# Patient Record
Sex: Female | Born: 1985
Health system: Southern US, Community
[De-identification: ages and names within clinical notes are randomized; demographics above are authoritative.]

## PROBLEM LIST (undated history)

## (undated) DIAGNOSIS — J45909 Unspecified asthma, uncomplicated: Secondary | ICD-10-CM

## (undated) DIAGNOSIS — T7840XA Allergy, unspecified, initial encounter: Secondary | ICD-10-CM

## (undated) DIAGNOSIS — O24419 Gestational diabetes mellitus in pregnancy, unspecified control: Secondary | ICD-10-CM

## (undated) DIAGNOSIS — K5900 Constipation, unspecified: Secondary | ICD-10-CM

## (undated) DIAGNOSIS — E785 Hyperlipidemia, unspecified: Secondary | ICD-10-CM

## (undated) DIAGNOSIS — N939 Abnormal uterine and vaginal bleeding, unspecified: Secondary | ICD-10-CM

## (undated) DIAGNOSIS — N920 Excessive and frequent menstruation with regular cycle: Secondary | ICD-10-CM

## (undated) DIAGNOSIS — N852 Hypertrophy of uterus: Secondary | ICD-10-CM

## (undated) DIAGNOSIS — B159 Hepatitis A without hepatic coma: Secondary | ICD-10-CM

## (undated) DIAGNOSIS — R87629 Unspecified abnormal cytological findings in specimens from vagina: Secondary | ICD-10-CM

## (undated) HISTORY — DX: Abnormal uterine and vaginal bleeding, unspecified: N93.9

## (undated) HISTORY — DX: Unspecified asthma, uncomplicated: J45.909

## (undated) HISTORY — DX: Hypertrophy of uterus: N85.2

## (undated) HISTORY — DX: Excessive and frequent menstruation with regular cycle: N92.0

## (undated) HISTORY — DX: Hyperlipidemia, unspecified: E78.5

## (undated) HISTORY — DX: Unspecified abnormal cytological findings in specimens from vagina: R87.629

## (undated) HISTORY — DX: Gestational diabetes mellitus in pregnancy, unspecified control: O24.419

## (undated) HISTORY — DX: Constipation, unspecified: K59.00

## (undated) HISTORY — PX: COLPOSCOPY: SHX161

## (undated) HISTORY — DX: Allergy, unspecified, initial encounter: T78.40XA

## (undated) HISTORY — DX: Hepatitis a without hepatic coma: B15.9

---

## 2011-06-07 ENCOUNTER — Emergency Department: Payer: Self-pay

## 2014-06-09 LAB — HM PAP SMEAR: HM Pap smear: NEGATIVE

## 2015-04-29 ENCOUNTER — Other Ambulatory Visit: Payer: Self-pay

## 2015-04-29 ENCOUNTER — Encounter: Payer: Self-pay | Admitting: Obstetrics and Gynecology

## 2015-05-03 ENCOUNTER — Ambulatory Visit: Admit: 2015-05-03 | Payer: Self-pay | Admitting: Obstetrics and Gynecology

## 2015-05-03 SURGERY — LAPAROSCOPY OPERATIVE
Anesthesia: Choice

## 2015-06-15 ENCOUNTER — Ambulatory Visit (INDEPENDENT_AMBULATORY_CARE_PROVIDER_SITE_OTHER): Payer: BLUE CROSS/BLUE SHIELD | Admitting: Obstetrics and Gynecology

## 2015-06-15 ENCOUNTER — Encounter: Payer: Self-pay | Admitting: Obstetrics and Gynecology

## 2015-06-15 VITALS — BP 114/73 | HR 71 | Ht 64.0 in | Wt 150.0 lb

## 2015-06-15 DIAGNOSIS — N946 Dysmenorrhea, unspecified: Secondary | ICD-10-CM | POA: Diagnosis not present

## 2015-06-15 DIAGNOSIS — Z8742 Personal history of other diseases of the female genital tract: Secondary | ICD-10-CM | POA: Diagnosis not present

## 2015-06-15 DIAGNOSIS — N852 Hypertrophy of uterus: Secondary | ICD-10-CM | POA: Diagnosis not present

## 2015-06-15 DIAGNOSIS — Z01419 Encounter for gynecological examination (general) (routine) without abnormal findings: Secondary | ICD-10-CM

## 2015-06-15 DIAGNOSIS — Z975 Presence of (intrauterine) contraceptive device: Secondary | ICD-10-CM

## 2015-06-15 DIAGNOSIS — K649 Unspecified hemorrhoids: Secondary | ICD-10-CM | POA: Diagnosis not present

## 2015-06-15 DIAGNOSIS — N920 Excessive and frequent menstruation with regular cycle: Secondary | ICD-10-CM | POA: Insufficient documentation

## 2015-06-15 NOTE — Progress Notes (Signed)
Patient ID: Lenise Herald, female   DOB: 08-11-1986, 29 y.o.   MRN: [id number] ANNUAL PREVENTATIVE CARE GYN  ENCOUNTER NOTE  Subjective:       Lakeria Austynn Pridmore is a 29 y.o. [id number] female here for a routine annual gynecologic exam.  Current complaints: 1.  Birth control options 2.  Fatigue. 3.  Headaches.  Patient desires change in birth control.  She would like to proceed with Mirena IUD insertion.  She has had difficulty being compliant with oral contraceptives.  Previously the patient had a ParaGard IUD which was removed because of her chronic pain.     Gynecologic History Patient's last menstrual period was 06/05/2015. Contraception: OCP (estrogen/progesterone) Last Pap: pap w rflx- neg 05/2014. Results were: normal Last mammogram: n/a. Results were: n/a Obstetric History OB History  Gravida Para Term Preterm AB SAB TAB Ectopic Multiple Living  1 1 1       1     # Outcome Date GA Lbr Len/2nd Weight Sex Delivery Anes PTL Lv  1 Term    6 lb 14.4 oz (3.13 kg) M VBAC   Y      Past Medical History  Diagnosis Date  . Abnormal uterine bleeding (AUB)   . Enlarged uterus   . Vaginal Pap smear, abnormal   . Constipation   . Heavy periods     Past Surgical History  Procedure Laterality Date  . Colposcopy      Current Outpatient Prescriptions on File Prior to Visit  Medication Sig Dispense Refill  . ibuprofen (ADVIL,MOTRIN) 200 MG tablet Take 200 mg by mouth every 6 (six) hours as needed.    . norethindrone-ethinyl estradiol (JUNEL FE,GILDESS FE,LOESTRIN FE) 1-20 MG-MCG tablet Take 1 tablet by mouth daily.     No current facility-administered medications on file prior to visit.    No Known Allergies  Social History   Social History  . Marital Status: Single    Spouse Name: N/A  . Number of Children: N/A  . Years of Education: N/A   Occupational History  . Not on file.   Social History Main Topics  . Smoking status: Never Smoker   . Smokeless tobacco: Not  on file  . Alcohol Use: Yes  . Drug Use: No  . Sexual Activity: Yes    Birth Control/ Protection: Pill   Other Topics Concern  . Not on file   Social History Narrative    Family History  Problem Relation Age of Onset  . Cancer Neg Hx   . Diabetes Neg Hx   . Heart disease Neg Hx     The following portions of the patient's history were reviewed and updated as appropriate: allergies, current medications, past family history, past medical history, past social history, past surgical history and problem list.  Review of Systems ROS Review of Systems - General ROS: negative for - chills, fatigue, fever, hot flashes, night sweats, weight gain or weight loss Psychological ROS: negative for - anxiety, decreased libido, depression, mood swings, physical abuse or sexual abuse Ophthalmic ROS: negative for - blurry vision, eye pain or loss of vision ENT ROS: negative for - headaches, hearing change, visual changes or vocal changes Allergy and Immunology ROS: negative for - hives, itchy/watery eyes or seasonal allergies Hematological and Lymphatic ROS: negative for - bleeding problems, bruising, swollen lymph nodes or weight loss Endocrine ROS: negative for - galactorrhea, hair pattern changes, hot flashes, malaise/lethargy, mood swings, palpitations, polydipsia/polyuria, skin changes, temperature intolerance or unexpected weight  changes Breast ROS: negative for - new or changing breast lumps or nipple discharge Respiratory ROS: negative for - cough or shortness of breath Cardiovascular ROS: negative for - chest pain, irregular heartbeat, palpitations or shortness of breath Gastrointestinal ROS: no abdominal pain, change in bowel habits, or black or bloody stools Genito-Urinary ROS: no dysuria, trouble voiding, or hematuria Musculoskeletal ROS: negative for - joint pain or joint stiffness Neurological ROS: negative for - bowel and bladder control changes Dermatological ROS: negative for rash  and skin lesion changes   Objective:   BP 114/73 mmHg  Pulse 71  Ht  (1.626 m)  Wt 150 lb (68.04 kg)  BMI 25.73 kg/m2  LMP 06/05/2015 CONSTITUTIONAL: Well-developed, well-nourished female in no acute distress.  PSYCHIATRIC: Normal mood and affect. Normal behavior. Normal judgment and thought content. NEUROLGIC: Alert and oriented to person, place, and time. Normal muscle tone coordination. No cranial nerve deficit noted. HENT:  Normocephalic, atraumatic, External right and left ear normal. Oropharynx is clear and moist EYES: Conjunctivae and EOM are normal. Pupils are equal, round, and reactive to light. No scleral icterus.  NECK: Normal range of motion, supple, no masses.  Normal thyroid.  SKIN: Skin is warm and dry. No rash noted. Not diaphoretic. No erythema. No pallor. CARDIOVASCULAR: Normal heart rate noted, regular rhythm, no murmur. RESPIRATORY: Clear to auscultation bilaterally. Effort and breath sounds normal, no problems with respiration noted. BREASTS: Symmetric in size. No masses, skin changes, nipple drainage, or lymphadenopathy. ABDOMEN: Soft, normal bowel sounds, no distention noted.  No tenderness, rebound or guarding.  BLADDER: Normal PELVIC:  External Genitalia: Normal  BUS: Normal  Vagina: Normal  Cervix: Normal; mild cervical motion tenderness  Uterus: slightly enlarged 10 weeks size, mobile, 1/4 tender  Adnexa: Normal  RV: External Exam NormaI  MUSCULOSKELETAL: Normal range of motion. No tenderness.  No cyanosis, clubbing, or edema.  2+ distal pulses. LYMPHATIC: No Axillary, Supraclavicular, or Inguinal Adenopathy.    Assessment:   Annual gynecologic examination 29 y.o. Contraception: OCP (estrogen/progesterone); dissatisfied with pill causes of difficulty with compliance; desires Mirena IUD Normal BMI Headaches. Fatigue  Plan:  Pap: Pap, Reflex if ASCUS Mammogram: Not Indicated Stool Guaiac Testing:  Not Indicated Labs: lipid vitd tsh a1c  fbs Routine preventative health maintenance measures emphasized: Exercise/Diet/Weight control, Tobacco Warnings, Alcohol/Substance use risks and Safe Sex Abstain from intercourse 2 weeks; return in 2 weeks for Mirena IUD insertion Return to Clinic - 1 Year for annual exam.   Darol Destine, CMA  Herold Harms, MD

## 2015-06-15 NOTE — Patient Instructions (Signed)
1.  Pap smear performed. 2.  Abstain from intercourse for 2 weeks. 3.  Return in 2 weeks for Mirena IUD insertion. 4.  Annual exam in 1 year.

## 2015-06-18 LAB — PAP IG W/ RFLX HPV ASCU: PAP SMEAR COMMENT: 0

## 2015-06-30 ENCOUNTER — Telehealth: Payer: Self-pay | Admitting: Obstetrics and Gynecology

## 2015-06-30 MED ORDER — NORETHIN ACE-ETH ESTRAD-FE 1-20 MG-MCG PO TABS: 1.0000 | ORAL_TABLET | Freq: Every day | ORAL | Status: DC

## 2015-06-30 NOTE — Telephone Encounter (Signed)
PT HAD TO RESCHEDULE HER IUD INSERTION, CAN YOU PLEASE REFIILL HER BC 1 MORE MONTH UNTIL HER VISIT  OCT 6

## 2015-06-30 NOTE — Telephone Encounter (Signed)
Pt aware ocp erx. 

## 2015-07-01 ENCOUNTER — Ambulatory Visit: Payer: BLUE CROSS/BLUE SHIELD | Admitting: Obstetrics and Gynecology

## 2015-07-22 ENCOUNTER — Ambulatory Visit: Payer: BLUE CROSS/BLUE SHIELD | Admitting: Obstetrics and Gynecology

## 2015-08-02 ENCOUNTER — Telehealth: Payer: Self-pay | Admitting: Obstetrics and Gynecology

## 2015-08-02 MED ORDER — NORETHIN ACE-ETH ESTRAD-FE 1-20 MG-MCG PO TABS: 1.0000 | ORAL_TABLET | Freq: Every day | ORAL | Status: DC

## 2015-08-02 NOTE — Telephone Encounter (Signed)
Please refill her BC, pt cancelled the iud insertion to think about it some more

## 2015-08-02 NOTE — Telephone Encounter (Signed)
Pt aware ocp erx. If pt decides not to have iud inserted she will let me know and I will send ocp until next AE.

## 2015-08-03 ENCOUNTER — Ambulatory Visit: Payer: BLUE CROSS/BLUE SHIELD | Admitting: Obstetrics and Gynecology

## 2015-08-13 ENCOUNTER — Encounter: Payer: Self-pay | Admitting: Family Medicine

## 2015-08-13 ENCOUNTER — Encounter (INDEPENDENT_AMBULATORY_CARE_PROVIDER_SITE_OTHER): Payer: Self-pay

## 2015-08-13 ENCOUNTER — Ambulatory Visit (INDEPENDENT_AMBULATORY_CARE_PROVIDER_SITE_OTHER): Payer: BLUE CROSS/BLUE SHIELD | Admitting: Family Medicine

## 2015-08-13 VITALS — BP 116/68 | HR 94 | Temp 98.6°F | Resp 18 | Ht 64.0 in | Wt 146.5 lb

## 2015-08-13 DIAGNOSIS — J45909 Unspecified asthma, uncomplicated: Secondary | ICD-10-CM | POA: Insufficient documentation

## 2015-08-13 DIAGNOSIS — J4520 Mild intermittent asthma, uncomplicated: Secondary | ICD-10-CM

## 2015-08-13 DIAGNOSIS — Z7189 Other specified counseling: Secondary | ICD-10-CM

## 2015-08-13 DIAGNOSIS — Z7689 Persons encountering health services in other specified circumstances: Secondary | ICD-10-CM | POA: Insufficient documentation

## 2015-08-13 NOTE — Progress Notes (Signed)
Name: Christy Farmer   MRN: [id number]    DOB: 01-05-1986   Date:08/13/2015       Progress Note  Subjective  Chief Complaint  Chief Complaint  Patient presents with  . est care    HPI  Pt. Is here to establish care. She is a former Dr. Marguerite OleaMoffett patient and is being [facility].  She denies any complaints today.  Past Medical History  Diagnosis Date  . Abnormal uterine bleeding (AUB)   . Enlarged uterus   . Vaginal Pap smear, abnormal   . Constipation   . Heavy periods   . Asthma   . Allergy     seasonal allergies.    Past Surgical History  Procedure Laterality Date  . Colposcopy      Family History  Problem Relation Age of Onset  . Cancer Neg Hx   . Diabetes Neg Hx   . Heart disease Neg Hx   . Hyperlipidemia Mother   . Hypertension Mother   . Healthy Father   . Healthy Brother     Social History   Social History  . Marital Status: Single    Spouse Name: N/A  . Number of Children: N/A  . Years of Education: N/A   Occupational History  . Not on file.   Social History Main Topics  . Smoking status: Never Smoker   . Smokeless tobacco: Not on file  . Alcohol Use: 1.2 oz/week    2 Glasses of wine per week  . Drug Use: No  . Sexual Activity: Yes    Birth Control/ Protection: Pill   Other Topics Concern  . Not on file   Social History Narrative    Current outpatient prescriptions:  .  albuterol (PROAIR HFA) 108 (90 BASE) MCG/ACT inhaler, Inhale into the lungs., Disp: , Rfl:  .  fexofenadine (ALLEGRA) 180 MG tablet, Take by mouth., Disp: , Rfl:  .  fluticasone (FLONASE) 50 MCG/ACT nasal spray, Place into the nose., Disp: , Rfl:  .  ibuprofen (ADVIL,MOTRIN) 200 MG tablet, Take 200 mg by mouth every 6 (six) hours as needed., Disp: , Rfl:  .  norethindrone-ethinyl estradiol (JUNEL FE,GILDESS FE,LOESTRIN FE) 1-20 MG-MCG tablet, Take 1 tablet by mouth daily., Disp: 1 Package, Rfl: 1  No Known Allergies   ROS   Objective  Filed  Vitals:   08/13/15 0959  BP: 116/68  Pulse: 94  Temp: 98.6 F (37 C)  Resp: 18  Height: 5\' 4"  (1.626 m)  Weight: 146 lb 8 oz (66.452 kg)  SpO2: 97%    Physical Exam  Constitutional: She is oriented to person, place, and time and well-developed, well-nourished, and in no distress.  Cardiovascular: Normal rate, regular rhythm and normal heart sounds.   Pulmonary/Chest: Effort normal and breath sounds normal. She has no wheezes.  Neurological: She is alert and oriented to person, place, and time.  Psychiatric: Memory, affect and judgment normal.  Nursing note and vitals reviewed.   Assessment & Plan  1. Encounter to establish care with new doctor Schedule for an annual fasting physical exam.  2. Asthma, mild intermittent, uncomplicated Only takes albuterol as needed.   Alois Colgan Asad A. Faylene KurtzShah [facility] 08/13/2015 10:20 AM

## 2015-08-18 ENCOUNTER — Ambulatory Visit (INDEPENDENT_AMBULATORY_CARE_PROVIDER_SITE_OTHER): Payer: BLUE CROSS/BLUE SHIELD | Admitting: Family Medicine

## 2015-08-18 ENCOUNTER — Encounter: Payer: Self-pay | Admitting: Family Medicine

## 2015-08-18 VITALS — BP 102/60 | HR 105 | Temp 99.7°F | Resp 16 | Ht 65.0 in | Wt 152.2 lb

## 2015-08-18 DIAGNOSIS — J029 Acute pharyngitis, unspecified: Secondary | ICD-10-CM | POA: Insufficient documentation

## 2015-08-18 DIAGNOSIS — R6889 Other general symptoms and signs: Secondary | ICD-10-CM | POA: Diagnosis not present

## 2015-08-18 LAB — POCT INFLUENZA A/B
INFLUENZA A, POC: NEGATIVE
Influenza B, POC: NEGATIVE

## 2015-08-18 LAB — POCT RAPID STREP A (OFFICE): RAPID STREP A SCREEN: NEGATIVE

## 2015-08-18 NOTE — Progress Notes (Signed)
Name: Christy Farmer   MRN: [id number]    DOB: 01-18-1986   Date:08/18/2015       Progress Note  Subjective  Chief Complaint  Chief Complaint  Patient presents with  . Sore Throat    chills, body aches for 1 day    Sore Throat  This is a new problem. The current episode started yesterday. There has been no fever (elevated Temp of 99.10F). Associated symptoms include headaches. Pertinent negatives include no coughing, diarrhea, ear pain, shortness of breath or trouble swallowing. She has had no exposure to strep. She has tried NSAIDs for the symptoms.   Past Medical History  Diagnosis Date  . Abnormal uterine bleeding (AUB)   . Enlarged uterus   . Vaginal Pap smear, abnormal   . Constipation   . Heavy periods   . Asthma   . Allergy     seasonal allergies.    Past Surgical History  Procedure Laterality Date  . Colposcopy      Family History  Problem Relation Age of Onset  . Cancer Neg Hx   . Diabetes Neg Hx   . Heart disease Neg Hx   . Hyperlipidemia Mother   . Hypertension Mother   . Healthy Father   . Healthy Brother     Social History   Social History  . Marital Status: Single    Spouse Name: N/A  . Number of Children: N/A  . Years of Education: N/A   Occupational History  . Not on file.   Social History Main Topics  . Smoking status: Never Smoker   . Smokeless tobacco: Not on file  . Alcohol Use: 1.2 oz/week    2 Glasses of wine per week  . Drug Use: No  . Sexual Activity: Yes    Birth Control/ Protection: Pill   Other Topics Concern  . Not on file   Social History Narrative     Current outpatient prescriptions:  .  albuterol (PROAIR HFA) 108 (90 BASE) MCG/ACT inhaler, Inhale into the lungs., Disp: , Rfl:  .  fexofenadine (ALLEGRA) 180 MG tablet, Take by mouth., Disp: , Rfl:  .  fluticasone (FLONASE) 50 MCG/ACT nasal spray, Place into the nose., Disp: , Rfl:  .  ibuprofen (ADVIL,MOTRIN) 200 MG tablet, Take 200 mg by mouth every 6 (six)  hours as needed., Disp: , Rfl:  .  norethindrone-ethinyl estradiol (JUNEL FE,GILDESS FE,LOESTRIN FE) 1-20 MG-MCG tablet, Take 1 tablet by mouth daily., Disp: 1 Package, Rfl: 1  No Known Allergies   Review of Systems  Constitutional: Positive for fever and chills.  HENT: Positive for sore throat. Negative for ear pain and trouble swallowing.   Respiratory: Negative for cough and shortness of breath.   Cardiovascular: Negative for chest pain.  Gastrointestinal: Negative for diarrhea.  Musculoskeletal: Positive for myalgias.  Neurological: Positive for headaches.    Objective  Filed Vitals:   08/18/15 0906  BP: 102/60  Pulse: 105  Temp: 99.7 F (37.6 C)  TempSrc: Oral  Resp: 16  Height:  (1.651 m)  Weight: 152 lb 3.2 oz (69.037 kg)  SpO2: 97%    Physical Exam  Constitutional: She is well-developed, well-nourished, and in no distress.  HENT:  Right Ear: Tympanic membrane and ear canal normal.  Left Ear: Tympanic membrane and ear canal normal.  Mouth/Throat: Posterior oropharyngeal edema and posterior oropharyngeal erythema present. No oropharyngeal exudate.  Cardiovascular: Normal rate, regular rhythm and normal heart sounds.   Pulmonary/Chest: Effort normal and  breath sounds normal. She has no decreased breath sounds. She has no wheezes. She has no rales.  Nursing note and vitals reviewed.    Assessment & Plan  1. Sore throat  - POCT rapid strep A - Anaerobic and Aerobic Culture  2. Viral pharyngitis Both strep and flu test are negative. Likely viral pharyngitis. Advised on conservative measures including hydration, salt water gargles, and NSAID therapy. RTC if no clinical improvement within 72 hours. - Anaerobic and Aerobic Culture  3. Flu-like symptoms  - POCT Influenza A/B    Orvill Coulthard Asad A. Faylene KurtzShah [facility] 08/18/2015 9:33 AM

## 2015-08-20 LAB — PLEASE NOTE

## 2015-08-26 LAB — ANAEROBIC AND AEROBIC CULTURE

## 2015-09-17 ENCOUNTER — Encounter: Payer: BLUE CROSS/BLUE SHIELD | Admitting: Family Medicine

## 2015-09-22 ENCOUNTER — Ambulatory Visit: Payer: BLUE CROSS/BLUE SHIELD | Admitting: Family Medicine

## 2015-11-01 ENCOUNTER — Encounter (INDEPENDENT_AMBULATORY_CARE_PROVIDER_SITE_OTHER): Payer: Self-pay

## 2015-11-18 ENCOUNTER — Encounter: Payer: BLUE CROSS/BLUE SHIELD | Admitting: Family Medicine

## 2015-12-01 ENCOUNTER — Ambulatory Visit (INDEPENDENT_AMBULATORY_CARE_PROVIDER_SITE_OTHER): Payer: BLUE CROSS/BLUE SHIELD | Admitting: Family Medicine

## 2015-12-01 ENCOUNTER — Encounter: Payer: Self-pay | Admitting: Family Medicine

## 2015-12-01 VITALS — BP 100/69 | HR 89 | Temp 98.6°F | Resp 19 | Ht 65.0 in | Wt 152.9 lb

## 2015-12-01 DIAGNOSIS — B9789 Other viral agents as the cause of diseases classified elsewhere: Secondary | ICD-10-CM

## 2015-12-01 DIAGNOSIS — J029 Acute pharyngitis, unspecified: Secondary | ICD-10-CM | POA: Diagnosis not present

## 2015-12-01 DIAGNOSIS — Z0000 Encounter for general adult medical examination without abnormal findings: Secondary | ICD-10-CM | POA: Diagnosis not present

## 2015-12-01 DIAGNOSIS — J028 Acute pharyngitis due to other specified organisms: Secondary | ICD-10-CM

## 2015-12-01 NOTE — Progress Notes (Signed)
Name: Christy Farmer   MRN: [id number]    DOB: Apr 27, 1986   Date:12/01/2015       Progress Note  Subjective  Chief Complaint  Chief Complaint  Patient presents with  . Annual Exam    CPE    HPI  Pt. Is here for a Complete Physical Exam. She is feeling sick (sore throat, nasal sinus congestion, ear pain).  She sees Dr. Greggory Keen for Gynecological follow up.  Past Medical History  Diagnosis Date  . Abnormal uterine bleeding (AUB)   . Enlarged uterus   . Vaginal Pap smear, abnormal   . Constipation   . Heavy periods   . Asthma   . Allergy     seasonal allergies.    Past Surgical History  Procedure Laterality Date  . Colposcopy      Family History  Problem Relation Age of Onset  . Cancer Neg Hx   . Diabetes Neg Hx   . Heart disease Neg Hx   . Hyperlipidemia Mother   . Hypertension Mother   . Healthy Father   . Healthy Brother     Social History   Social History  . Marital Status: Single    Spouse Name: N/A  . Number of Children: N/A  . Years of Education: N/A   Occupational History  . Not on file.   Social History Main Topics  . Smoking status: Never Smoker   . Smokeless tobacco: Not on file  . Alcohol Use: 1.2 oz/week    2 Glasses of wine per week  . Drug Use: No  . Sexual Activity: Yes    Birth Control/ Protection: Pill   Other Topics Concern  . Not on file   Social History Narrative     Current outpatient prescriptions:  .  albuterol (PROAIR HFA) 108 (90 BASE) MCG/ACT inhaler, Inhale into the lungs., Disp: , Rfl:  .  fluticasone (FLONASE) 50 MCG/ACT nasal spray, Place into the nose., Disp: , Rfl:  .  ibuprofen (ADVIL,MOTRIN) 200 MG tablet, Take 200 mg by mouth every 6 (six) hours as needed., Disp: , Rfl:  .  norethindrone-ethinyl estradiol (JUNEL FE,GILDESS FE,LOESTRIN FE) 1-20 MG-MCG tablet, Take 1 tablet by mouth daily., Disp: 1 Package, Rfl: 1  No Known Allergies   Review of Systems  Constitutional: Negative for fever, chills  and weight loss.  HENT: Positive for congestion, ear pain and sore throat.   Eyes: Negative for blurred vision and double vision.  Respiratory: Positive for cough and sputum production.   Cardiovascular: Negative for chest pain and palpitations.  Gastrointestinal: Negative for heartburn, nausea, vomiting, abdominal pain (occasional lower abdomen pains, present for last 2 months.), diarrhea, constipation and blood in stool.  Genitourinary: Negative for dysuria, urgency and hematuria.  Musculoskeletal: Negative for back pain and joint pain.  Neurological: Negative for dizziness.  Psychiatric/Behavioral: Negative for depression. The patient is not nervous/anxious and does not have insomnia.     Objective  Filed Vitals:   12/01/15 1115  BP: 100/69  Pulse: 89  Temp: 98.6 F (37 C)  TempSrc: Oral  Resp: 19  Height:  (1.651 m)  Weight: 152 lb 14.4 oz (69.355 kg)  SpO2: 98%    Physical Exam  Constitutional: She is oriented to person, place, and time and well-developed, well-nourished, and in no distress.  HENT:  Head: Normocephalic.  Right Ear: Tympanic membrane and ear canal normal.  Left Ear: Tympanic membrane and ear canal normal.  Nose: Right sinus exhibits no maxillary  sinus tenderness. Left sinus exhibits no maxillary sinus tenderness.  Mouth/Throat: Posterior oropharyngeal erythema present. No oropharyngeal exudate.  Eyes: Pupils are equal, round, and reactive to light.  Cardiovascular: Normal rate and regular rhythm.   Pulmonary/Chest: Effort normal and breath sounds normal.  Abdominal: Normal appearance and bowel sounds are normal. There is no tenderness.  Genitourinary:  Deferred to Gyn.  Neurological: She is alert and oriented to person, place, and time.  Skin: Skin is warm and dry.  Psychiatric: Mood, memory, affect and judgment normal.  Nursing note and vitals reviewed.    Assessment & Plan  1. Annual physical exam Obtain age-appropriate laboratory  screenings. Patient follows up with GYN for pelvic exams. - CBC with Differential - Lipid Profile - Comprehensive Metabolic Panel (CMET) - Vitamin D (25 hydroxy) - TSH  2. Acute viral pharyngitis Rapid strep test is negative. Patient advised on conservative measures for now. If symptoms do not improve within next 48 hours, she will call for antibiotic therapy.  3. Sore throat  - POCT rapid strep A   Hermena Swint Asad A. Faylene Kurtz [facility] 12/01/2015 11:48 AM

## 2015-12-02 LAB — COMPREHENSIVE METABOLIC PANEL
ALBUMIN: 4.7 g/dL (ref 3.5–5.5)
ALK PHOS: 42 IU/L (ref 39–117)
ALT: 12 IU/L (ref 0–32)
AST: 18 IU/L (ref 0–40)
Albumin/Globulin Ratio: 1.7 (ref 1.1–2.5)
BUN / CREAT RATIO: 11 (ref 8–20)
BUN: 7 mg/dL (ref 6–20)
Bilirubin Total: 0.3 mg/dL (ref 0.0–1.2)
CHLORIDE: 100 mmol/L (ref 96–106)
CO2: 23 mmol/L (ref 18–29)
Calcium: 9.7 mg/dL (ref 8.7–10.2)
Creatinine, Ser: 0.64 mg/dL (ref 0.57–1.00)
GFR calc Af Amer: 139 mL/min/{1.73_m2} (ref >59)
GFR calc non Af Amer: 121 mL/min/{1.73_m2} (ref >59)
GLUCOSE: 85 mg/dL (ref 65–99)
Globulin, Total: 2.7 g/dL (ref 1.5–4.5)
POTASSIUM: 4.4 mmol/L (ref 3.5–5.2)
SODIUM: 140 mmol/L (ref 134–144)
Total Protein: 7.4 g/dL (ref 6.0–8.5)

## 2015-12-02 LAB — LIPID PANEL
CHOL/HDL RATIO: 3.3 ratio (ref 0.0–4.4)
Cholesterol, Total: 187 mg/dL (ref 100–199)
HDL: 57 mg/dL (ref >39)
LDL Calculated: 107 mg/dL — ABNORMAL HIGH (ref 0–99)
Triglycerides: 114 mg/dL (ref 0–149)
VLDL CHOLESTEROL CAL: 23 mg/dL (ref 5–40)

## 2015-12-02 LAB — TSH: TSH: 1.55 u[IU]/mL (ref 0.450–4.500)

## 2015-12-02 LAB — CBC WITH DIFFERENTIAL/PLATELET
BASOS: 1 %
Basophils Absolute: 0.1 10*3/uL (ref 0.0–0.2)
EOS (ABSOLUTE): 0.2 10*3/uL (ref 0.0–0.4)
Eos: 2 %
Hematocrit: 41 % (ref 34.0–46.6)
Hemoglobin: 13.6 g/dL (ref 11.1–15.9)
Immature Grans (Abs): 0 10*3/uL (ref 0.0–0.1)
Immature Granulocytes: 0 %
Lymphocytes Absolute: 2.7 10*3/uL (ref 0.7–3.1)
Lymphs: 26 %
MCH: 28.8 pg (ref 26.6–33.0)
MCHC: 33.2 g/dL (ref 31.5–35.7)
MCV: 87 fL (ref 79–97)
MONOS ABS: 0.7 10*3/uL (ref 0.1–0.9)
Monocytes: 7 %
NEUTROS ABS: 6.6 10*3/uL (ref 1.4–7.0)
Neutrophils: 64 %
PLATELETS: 312 10*3/uL (ref 150–379)
RBC: 4.72 x10E6/uL (ref 3.77–5.28)
RDW: 14.3 % (ref 12.3–15.4)
WBC: 10.3 10*3/uL (ref 3.4–10.8)

## 2015-12-02 LAB — VITAMIN D 25 HYDROXY (VIT D DEFICIENCY, FRACTURES): VIT D 25 HYDROXY: 21.4 ng/mL — AB (ref 30.0–100.0)

## 2015-12-08 ENCOUNTER — Telehealth: Payer: Self-pay

## 2015-12-08 MED ORDER — VITAMIN D (ERGOCALCIFEROL) 1.25 MG (50000 UNIT) PO CAPS: 50000.0000 [IU] | ORAL_CAPSULE | ORAL | Status: DC

## 2015-12-08 NOTE — Telephone Encounter (Signed)
Prescription for Vitamin D3 50,000 units has been sent to [facility] [address] per Dr. Sherryll Burger patient has been notified

## 2015-12-13 ENCOUNTER — Telehealth: Payer: Self-pay | Admitting: Obstetrics and Gynecology

## 2015-12-13 MED ORDER — NORETHIN ACE-ETH ESTRAD-FE 1-20 MG-MCG PO TABS: 1.0000 | ORAL_TABLET | Freq: Every day | ORAL | Status: DC

## 2015-12-13 NOTE — Telephone Encounter (Signed)
Pt mis placed her pills this weekend. Just found them in her car. She did not take sat and sun pills. Advised to take mondays pill. Aware she will have some abnormal bleeding since she missed her pills. Pt states she wants to try mirena (again). Pt aware I will send in a refill of ocp x 3. [place] to pre-auth her mirena. Will call pt with copay with in a week. Pt aware she will have no IC x 2 weeks and a  Neg upt on day of insertion.

## 2015-12-13 NOTE — Telephone Encounter (Signed)
Pt is on BC pills and misplaced them. She hasn't taken 1 since Friday. She found the pills today and wants to know what to do.

## 2016-01-12 ENCOUNTER — Telehealth: Payer: Self-pay | Admitting: Obstetrics and Gynecology

## 2016-01-12 NOTE — Telephone Encounter (Signed)
Patient called with some questions regarding her birth control. She started her period before she started taking the brown pills.

## 2016-01-12 NOTE — Telephone Encounter (Signed)
Pt is on 2nd pack of ocp. She started her cycle today. 4 days early. Last month she missed a whole week of pills. Advised to keep taking them. Same time qd. If bleeding persists after 3rd pack she will need to be seen.

## 2016-02-11 DIAGNOSIS — R21 Rash and other nonspecific skin eruption: Secondary | ICD-10-CM | POA: Diagnosis not present

## 2016-02-22 ENCOUNTER — Telehealth: Payer: Self-pay | Admitting: Obstetrics and Gynecology

## 2016-02-22 NOTE — Telephone Encounter (Signed)
Patient called requesting an appointment for paragard insertion. She will be on her cycle around 5/21 but Dr D has nothing that week. Can you look at the schedule and let me know where I can put her. Thanks

## 2016-02-24 NOTE — Telephone Encounter (Signed)
Done.KEC °

## 2016-03-21 ENCOUNTER — Encounter: Payer: Self-pay | Admitting: Obstetrics and Gynecology

## 2016-03-21 ENCOUNTER — Ambulatory Visit (INDEPENDENT_AMBULATORY_CARE_PROVIDER_SITE_OTHER): Payer: BLUE CROSS/BLUE SHIELD | Admitting: Obstetrics and Gynecology

## 2016-03-21 VITALS — BP 106/69 | HR 77 | Ht 64.0 in | Wt 153.7 lb

## 2016-03-21 DIAGNOSIS — N920 Excessive and frequent menstruation with regular cycle: Secondary | ICD-10-CM

## 2016-03-21 DIAGNOSIS — N946 Dysmenorrhea, unspecified: Secondary | ICD-10-CM | POA: Diagnosis not present

## 2016-03-21 DIAGNOSIS — Z30014 Encounter for initial prescription of intrauterine contraceptive device: Secondary | ICD-10-CM

## 2016-03-21 LAB — POCT URINE PREGNANCY: PREG TEST UR: NEGATIVE

## 2016-03-21 NOTE — Progress Notes (Signed)
Chief complaint: 1. Mirena IUD insertion  IUD Insertion Procedure Note  Pre-operative Diagnosis: 1. Contraception 2. Dysmenorrhea 3. Menorrhagia  Post-operative Diagnosis: same  Indications: contraception  Procedure Details  Urine pregnancy test was done-negative.  The risks (including infection, bleeding, pain, and uterine perforation) and benefits of the procedure were explained to the patient and Verbal informed consent was obtained.    Cervix cleansed with Betadine. Uterus sounded to 8 cm. IUD inserted without difficulty. String visible and trimmed-3 cm long. Patient tolerated procedure well.  IUD Information: Mirena, Lot # [id number], Expiration date 1/20.  Condition: Stable  Complications: None  Plan:  The patient was advised to call for any fever or for prolonged or severe pain or bleeding. She was advised to use OTC acetaminophen and OTC ibuprofen as needed for mild to moderate pain.   Attending Physician Documentation: Herold HarmsMartin A Defrancesco, MD

## 2016-04-24 ENCOUNTER — Encounter: Payer: Self-pay | Admitting: Family Medicine

## 2016-04-24 ENCOUNTER — Ambulatory Visit (INDEPENDENT_AMBULATORY_CARE_PROVIDER_SITE_OTHER): Payer: Managed Care, Other (non HMO) | Admitting: Family Medicine

## 2016-04-24 VITALS — BP 120/71 | HR 90 | Temp 98.5°F | Resp 15 | Ht 65.0 in | Wt 161.5 lb

## 2016-04-24 DIAGNOSIS — R21 Rash and other nonspecific skin eruption: Secondary | ICD-10-CM

## 2016-04-24 DIAGNOSIS — L259 Unspecified contact dermatitis, unspecified cause: Secondary | ICD-10-CM

## 2016-04-24 MED ORDER — VALACYCLOVIR HCL 1 G PO TABS: 1000.0000 mg | ORAL_TABLET | Freq: Three times a day (TID) | ORAL | Status: DC

## 2016-04-24 MED ORDER — TRAMADOL HCL 50 MG PO TABS: 50.0000 mg | ORAL_TABLET | Freq: Three times a day (TID) | ORAL | Status: DC | PRN

## 2016-04-24 NOTE — Progress Notes (Signed)
Name: Christy Farmer   MRN: [id number]    DOB: 1986/04/17   Date:04/24/2016       Progress Note  Subjective  Chief Complaint  Chief Complaint  Patient presents with  . Rash    Arm and back    Rash This is a new problem. The current episode started in the past 7 days. The problem has been gradually worsening since onset. The affected locations include the right arm and back. The rash is characterized by blistering, burning and pain. Associated with: traveled to [place] last week, took a stroll  outdoors, never really went in to the woods. Pertinent negatives include no cough or fever. Past treatments include anti-itch cream. The treatment provided no relief.    Past Medical History  Diagnosis Date  . Abnormal uterine bleeding (AUB)   . Enlarged uterus   . Vaginal Pap smear, abnormal   . Constipation   . Heavy periods   . Asthma   . Allergy     seasonal allergies.    Past Surgical History  Procedure Laterality Date  . Colposcopy      Family History  Problem Relation Age of Onset  . Cancer Neg Hx   . Diabetes Neg Hx   . Heart disease Neg Hx   . Hyperlipidemia Mother   . Hypertension Mother   . Healthy Father   . Healthy Brother     Social History   Social History  . Marital Status: Single    Spouse Name: N/A  . Number of Children: N/A  . Years of Education: N/A   Occupational History  . Not on file.   Social History Main Topics  . Smoking status: Never Smoker   . Smokeless tobacco: Not on file  . Alcohol Use: 1.2 oz/week    2 Glasses of wine per week  . Drug Use: No  . Sexual Activity: Yes    Birth Control/ Protection: Pill   Other Topics Concern  . Not on file   Social History Narrative     Current outpatient prescriptions:  .  ibuprofen (ADVIL,MOTRIN) 200 MG tablet, Take 200 mg by mouth every 6 (six) hours as needed., Disp: , Rfl:  .  albuterol (PROAIR HFA) 108 (90 BASE) MCG/ACT inhaler, Inhale into the lungs., Disp: , Rfl:  .   fluticasone (FLONASE) 50 MCG/ACT nasal spray, Place into the nose., Disp: , Rfl:   No Known Allergies   Review of Systems  Constitutional: Negative for fever.  Respiratory: Negative for cough.   Skin: Positive for rash.     Objective  Filed Vitals:   04/24/16 1031  BP: 120/71  Pulse: 90  Temp: 98.5 F (36.9 C)  TempSrc: Oral  Resp: 15  Height: 5\' 5"  (1.651 m)  Weight: 161 lb 8 oz (73.256 kg)  SpO2: 98%    Physical Exam  Constitutional: She is well-developed, well-nourished, and in no distress.  Skin: Rash noted. Rash is pustular and vesicular. There is erythema.     Erythematous vesiculo-pustular rash on the right upper medial arm and right upper back. This is present in the T1 dermatome. Intense pain, no pus or drainage.  Nursing note and vitals reviewed.    Assessment & Plan  1. Skin rash By history and exam, rash is suspicious for shingles. We will obtain serologic testing and start on Valtrex. DDX includes contact dermatitis from poison ivy. If testing is negative, then DC Valtrex and start on corticosteroid therapy  - Varicella zoster  antibody, IgM - Varicella zoster antibody, IgG - valACYclovir (VALTREX) 1000 MG tablet; Take 1 tablet (1,000 mg total) by mouth 3 (three) times daily.  Dispense: 21 tablet; Refill: 0 - traMADol (ULTRAM) 50 MG tablet; Take 1 tablet (50 mg total) by mouth every 8 (eight) hours as needed.  Dispense: 30 tablet; Refill: 0   Apoorva Bugay Asad A. Faylene Kurtz [facility] 04/24/2016 10:42 AM

## 2016-04-25 ENCOUNTER — Telehealth: Payer: Self-pay | Admitting: Family Medicine

## 2016-04-25 ENCOUNTER — Ambulatory Visit: Payer: BLUE CROSS/BLUE SHIELD | Admitting: Obstetrics and Gynecology

## 2016-04-25 NOTE — Telephone Encounter (Signed)
Please inform patient that none of her lab results are available at this time.

## 2016-04-25 NOTE — Telephone Encounter (Signed)
Pt requesting a call about her lab results

## 2016-04-26 ENCOUNTER — Encounter: Payer: Self-pay | Admitting: Family Medicine

## 2016-04-26 ENCOUNTER — Telehealth: Payer: Self-pay | Admitting: Family Medicine

## 2016-04-26 LAB — VARICELLA ZOSTER ANTIBODY, IGG

## 2016-04-26 NOTE — Telephone Encounter (Signed)
Patient stated that she is waiting on lab results.  She stated that she had the labs done on 04/24/16.

## 2016-04-27 LAB — VARICELLA ZOSTER ANTIBODY, IGM

## 2016-04-28 DIAGNOSIS — L259 Unspecified contact dermatitis, unspecified cause: Secondary | ICD-10-CM | POA: Insufficient documentation

## 2016-04-28 MED ORDER — TRIAMCINOLONE ACETONIDE 0.5 % EX OINT
1.0000 | TOPICAL_OINTMENT | Freq: Two times a day (BID) | CUTANEOUS | Status: DC
Start: 2016-04-28 — End: 2016-06-20

## 2016-04-28 NOTE — Telephone Encounter (Signed)
Patient notified

## 2016-04-28 NOTE — Addendum Note (Signed)
Addended bySherryll Burger: Cache Decoursey A A on: 04/28/2016 09:15 AM   Modules accepted: Orders

## 2016-05-02 ENCOUNTER — Encounter: Payer: Self-pay | Admitting: Obstetrics and Gynecology

## 2016-05-02 ENCOUNTER — Ambulatory Visit (INDEPENDENT_AMBULATORY_CARE_PROVIDER_SITE_OTHER): Payer: Managed Care, Other (non HMO) | Admitting: Obstetrics and Gynecology

## 2016-05-02 VITALS — BP 114/75 | HR 67 | Ht 64.0 in | Wt 156.6 lb

## 2016-05-02 DIAGNOSIS — Z30431 Encounter for routine checking of intrauterine contraceptive device: Secondary | ICD-10-CM | POA: Diagnosis not present

## 2016-05-02 NOTE — Patient Instructions (Signed)
1. Return for annual exam as scheduled 2. Continue to monitor abnormal uterine bleeding/spotting

## 2016-05-02 NOTE — Progress Notes (Signed)
Chief complaint: 1. IUD string check  Patient presents for IUD string check following insertion 4 weeks ago. She is experiencing mild spotting. Does not report any significant pelvic pain.  Past medical history, past surgical history, problem list, medications, and allergies are reviewed  OBJECTIVE: BP 114/75 mmHg  Pulse 67  Ht 5\' 4"  (1.626 m)  Wt 156 lb 9.6 oz (71.033 kg)  BMI 26.87 kg/m2 Pleasant female in no acute distress Abdomen: Soft, nontender Pelvic exam: External genitalia normal BUS-normal Vagina-normal Cervix-IUD string 2.5-3 cm Bimanual-not done  ASSESSMENT: 1. Normal IUD string check 4 weeks status post Mirena IUD insertion  PLAN: 1. Return for annual exam as scheduled 2. Continue to monitor abnormal uterine bleeding/spotting  Herold HarmsMartin A Pete Merten, MD  Note: This dictation was prepared with Dragon dictation along with smaller phrase technology. Any transcriptional errors that result from this process are unintentional.

## 2016-06-16 NOTE — Progress Notes (Signed)
ANNUAL PREVENTATIVE CARE GYN  ENCOUNTER NOTE  Subjective:       Christy Farmer is a 30 y.o. [id number] female here for a routine annual gynecologic exam.  Current complaints: 1.   None  Since the Mirena IUD has been placed, cramps have diminished significantly. She is having intermittent spotting. She is monogamous with 1 partner. She is contemplating regnancy next year   Gynecologic History No LMP recorded. Patient is not currently having periods (Reason: IUD). Contraception: IUD Last Pap: 05/2015 reflex neg. Results were: normal Last mammogram: n/a. Results were:   Obstetric History OB History  Gravida Para Term Preterm AB Living  1 1 1     1   SAB TAB Ectopic Multiple Live Births          1    # Outcome Date GA Lbr Len/2nd Weight Sex Delivery Anes PTL Lv  1 Term    6 lb 14.4 oz (3.13 kg) M Vag-Spont   LIV      Past Medical History:  Diagnosis Date  . Abnormal uterine bleeding (AUB)   . Allergy    seasonal allergies.  . Asthma   . Constipation   . Enlarged uterus   . Heavy periods   . Vaginal Pap smear, abnormal     Past Surgical History:  Procedure Laterality Date  . COLPOSCOPY      Current Outpatient Prescriptions on File Prior to Visit  Medication Sig Dispense Refill  . ibuprofen (ADVIL,MOTRIN) 200 MG tablet Take 200 mg by mouth every 6 (six) hours as needed.    Marland Kitchen. levonorgestrel (MIRENA) 20 MCG/24HR IUD 1 each by Intrauterine route once.    . triamcinolone ointment (KENALOG) 0.5 % Apply 1 application topically 2 (two) times daily. 30 g 0   No current facility-administered medications on file prior to visit.     No Known Allergies  Social History   Social History  . Marital status: Single    Spouse name: N/A  . Number of children: N/A  . Years of education: N/A   Occupational History  . Not on file.   Social History Main Topics  . Smoking status: Never Smoker  . Smokeless tobacco: Not on file  . Alcohol use 1.2 oz/week    2 Glasses of wine  per week  . Drug use: No  . Sexual activity: Yes    Birth control/ protection: IUD   Other Topics Concern  . Not on file   Social History Narrative  . No narrative on file    Family History  Problem Relation Age of Onset  . Cancer Neg Hx   . Diabetes Neg Hx   . Heart disease Neg Hx   . Hyperlipidemia Mother   . Hypertension Mother   . Healthy Father   . Healthy Brother     The following portions of the patient's history were reviewed and updated as appropriate: allergies, current medications, past family history, past medical history, past social history, past surgical history and problem list.  Review of Systems ROS Review of Systems - General ROS: negative for - chills, fatigue, fever, hot flashes, night sweats, weight gain or weight loss Psychological ROS: negative for - anxiety, decreased libido, depression, mood swings, physical abuse or sexual abuse Ophthalmic ROS: negative for - blurry vision, eye pain or loss of vision ENT ROS: negative for - headaches, hearing change, visual changes or vocal changes Allergy and Immunology ROS: negative for - hives, itchy/watery eyes or seasonal allergies Hematological and  Lymphatic ROS: negative for - bleeding problems, bruising, swollen lymph nodes or weight loss Endocrine ROS: negative for - galactorrhea, hair pattern changes, hot flashes, malaise/lethargy, mood swings, palpitations, polydipsia/polyuria, skin changes, temperature intolerance or unexpected weight changes Breast ROS: negative for - new or changing breast lumps or nipple discharge Respiratory ROS: negative for - cough or shortness of breath Cardiovascular ROS: negative for - chest pain, irregular heartbeat, palpitations or shortness of breath Gastrointestinal ROS: no abdominal pain, change in bowel habits, or black or bloody stools Genito-Urinary ROS: no dysuria, trouble voiding, or hematuria Musculoskeletal ROS: negative for - joint pain or joint  stiffness Neurological ROS: negative for - bowel and bladder control changes Dermatological ROS: negative for rash and skin lesion changes   Objective:   BP 124/72   Pulse 71   Ht 5\' 4"  (1.626 m)   Wt 158 lb 6.4 oz (71.8 kg)   LMP  (LMP Unknown) Comment: spotting since insertion  BMI 27.19 kg/m  CONSTITUTIONAL: Well-developed, well-nourished female in no acute distress.  PSYCHIATRIC: Normal mood and affect. Normal behavior. Normal judgment and thought content. NEUROLGIC: Alert and oriented to person, place, and time. Normal muscle tone coordination. No cranial nerve deficit noted. HENT:  Normocephalic, atraumatic, External right and left ear normal. Oropharynx is clear and moist EYES: Conjunctivae and EOM are normal. Pupils are equal, round, and reactive to light. No scleral icterus.  NECK: Normal range of motion, supple, no masses.  Normal thyroid.  SKIN: Skin is warm and dry. No rash noted. Not diaphoretic. No erythema. No pallor. CARDIOVASCULAR: Normal heart rate noted, regular rhythm, no murmur. RESPIRATORY: Clear to auscultation bilaterally. Effort and breath sounds normal, no problems with respiration noted. BREASTS: Symmetric in size. No masses, skin changes, nipple drainage, or lymphadenopathy. ABDOMEN: Soft, normal bowel sounds, no distention noted.  No tenderness, rebound or guarding.  BLADDER: Normal PELVIC:  External Genitalia: Normal  BUS: Normal  Vagina: Normal  Cervix: Normal; IUD strings visualized-3 cm; no cervical motion tenderness  Uterus: Mid plane to anteverted; top normal size, globular, nontender  Adnexa: Normal  RV: External Exam NormaI  MUSCULOSKELETAL: Normal range of motion. No tenderness.  No cyanosis, clubbing, or edema.  2+ distal pulses. LYMPHATIC: No Axillary, Supraclavicular, or Inguinal Adenopathy.    Assessment:   Annual gynecologic examination 30 y.o. Contraception: IUD Normal BMI Problem List Items Addressed This Visit    History of  abnormal cervical Pap smear   Enlarged uterus    Other Visit Diagnoses    Well woman exam with routine gynecological exam    -  Primary   Contraception, device intrauterine          Plan:  Pap: Pap Co Test Mammogram: Not Indicated Stool Guaiac Testing:  Not Indicated Labs: tsh vit d lipid a1c fbs Routine preventative health maintenance measures emphasized: Exercise/Diet/Weight control, Tobacco Warnings, Alcohol/Substance use risks and Safe Sex Return to Clinic - 1 Year   Christy Farmer, CMA  Herold Harms, MD  Note: This dictation was prepared with Dragon dictation along with smaller phrase technology. Any transcriptional errors that result from this process are unintentional.

## 2016-06-20 ENCOUNTER — Encounter: Payer: Self-pay | Admitting: Obstetrics and Gynecology

## 2016-06-20 ENCOUNTER — Ambulatory Visit (INDEPENDENT_AMBULATORY_CARE_PROVIDER_SITE_OTHER): Payer: Managed Care, Other (non HMO) | Admitting: Obstetrics and Gynecology

## 2016-06-20 VITALS — BP 124/72 | HR 71 | Ht 64.0 in | Wt 158.4 lb

## 2016-06-20 DIAGNOSIS — Z01419 Encounter for gynecological examination (general) (routine) without abnormal findings: Secondary | ICD-10-CM

## 2016-06-20 DIAGNOSIS — N852 Hypertrophy of uterus: Secondary | ICD-10-CM

## 2016-06-20 DIAGNOSIS — Z975 Presence of (intrauterine) contraceptive device: Secondary | ICD-10-CM

## 2016-06-20 DIAGNOSIS — Z8742 Personal history of other diseases of the female genital tract: Secondary | ICD-10-CM

## 2016-06-20 NOTE — Patient Instructions (Signed)
1. Pap smear is done 2. Breast awareness encouraged 3. Continue with healthy eating and exercise 4. Contraception-Mirena IUD strings measures 3 cm 5. Return in 1 year for annual exam 6. Screening labs are ordered

## 2016-06-25 LAB — PAP IG AND HPV HIGH-RISK
HPV, high-risk: NEGATIVE
PAP Smear Comment: 0

## 2016-10-26 ENCOUNTER — Ambulatory Visit: Payer: Managed Care, Other (non HMO) | Admitting: Obstetrics and Gynecology

## 2016-11-09 ENCOUNTER — Ambulatory Visit: Payer: Managed Care, Other (non HMO) | Admitting: Obstetrics and Gynecology

## 2016-11-17 ENCOUNTER — Encounter: Payer: Self-pay | Admitting: Family Medicine

## 2016-11-17 ENCOUNTER — Ambulatory Visit (INDEPENDENT_AMBULATORY_CARE_PROVIDER_SITE_OTHER): Payer: Managed Care, Other (non HMO) | Admitting: Family Medicine

## 2016-11-17 DIAGNOSIS — Z0000 Encounter for general adult medical examination without abnormal findings: Secondary | ICD-10-CM | POA: Insufficient documentation

## 2016-11-17 NOTE — Progress Notes (Signed)
Name: Christy Farmer   MRN: [id number]    DOB: [birth year]   Date:11/17/2016       Progress Note  Subjective  Chief Complaint  Chief Complaint  Patient presents with  . Annual Exam    HPI  Pt. Presents for Annual physical exam. Pap Smear performed by OBGYN She is doing well.    Past Medical History:  Diagnosis Date  . Abnormal uterine bleeding (AUB)   . Allergy    seasonal allergies.  . Asthma   . Constipation   . Enlarged uterus   . Heavy periods   . Vaginal Pap smear, abnormal     Past Surgical History:  Procedure Laterality Date  . COLPOSCOPY      Family History  Problem Relation Age of Onset  . Hyperlipidemia Mother   . Hypertension Mother   . Healthy Father   . Healthy Brother   . Cancer Neg Hx   . Diabetes Neg Hx   . Heart disease Neg Hx     Social History   Social History  . Marital status: Single    Spouse name: N/A  . Number of children: N/A  . Years of education: N/A   Occupational History  . Not on file.   Social History Main Topics  . Smoking status: Never Smoker  . Smokeless tobacco: Never Used  . Alcohol use 1.2 oz/week    2 Glasses of wine per week  . Drug use: No  . Sexual activity: Yes    Birth control/ protection: IUD   Other Topics Concern  . Not on file   Social History Narrative  . No narrative on file     Current Outpatient Prescriptions:  .  ibuprofen (ADVIL,MOTRIN) 200 MG tablet, Take 200 mg by mouth every 6 (six) hours as needed., Disp: , Rfl:  .  levonorgestrel (MIRENA) 20 MCG/24HR IUD, 1 each by Intrauterine route once., Disp: , Rfl:  .  fluorometholone (FML) 0.1 % ophthalmic suspension, , Disp: , Rfl:   No Known Allergies   Review of Systems  Constitutional: Negative for chills, fever and malaise/fatigue.  HENT: Negative for congestion, ear pain and sore throat.   Eyes: Negative for blurred vision and double vision.  Respiratory: Negative for cough, sputum production and shortness of breath.    Cardiovascular: Negative for chest pain and leg swelling.  Gastrointestinal: Negative for abdominal pain, blood in stool, constipation, diarrhea, nausea and vomiting.  Genitourinary: Negative for dysuria and hematuria.  Musculoskeletal: Negative for back pain, myalgias and neck pain.  Neurological: Positive for headaches.  Psychiatric/Behavioral: Negative for depression. The patient is not nervous/anxious and does not have insomnia.     Objective  Vitals:   11/17/16 1457  BP: 120/80  Pulse: 80  Resp: 16  Temp: 98 F (36.7 C)  TempSrc: Oral  SpO2: 99%  Weight: 153 lb 8 oz (69.6 kg)  Height: 5\' 4"  (1.626 m)    Physical Exam  Constitutional: She is oriented to person, place, and time and well-developed, well-nourished, and in no distress.  HENT:  Head: Normocephalic and atraumatic.  Right Ear: Tympanic membrane and ear canal normal.  Left Ear: Tympanic membrane and ear canal normal.  Nose: Right sinus exhibits no maxillary sinus tenderness and no frontal sinus tenderness. Left sinus exhibits no maxillary sinus tenderness and no frontal sinus tenderness.  Mouth/Throat: Oropharynx is clear and moist. No posterior oropharyngeal erythema.  Neck: Neck supple.  Cardiovascular: Normal rate, regular rhythm, S1 normal, S2 normal  and normal heart sounds.   No murmur heard. Pulmonary/Chest: Effort normal and breath sounds normal. She has no wheezes. She has no rhonchi.  Abdominal: Soft. Bowel sounds are normal. There is no tenderness.  Musculoskeletal: She exhibits no edema.       Cervical back: She exhibits no tenderness, no swelling and no pain.       Thoracic back: She exhibits normal range of motion, no tenderness and no swelling.       Lumbar back: She exhibits normal range of motion, no tenderness and no pain.  Neurological: She is alert and oriented to person, place, and time.  Psychiatric: Mood, memory, affect and judgment normal.  Nursing note and vitals  reviewed.      Assessment & Plan  1. Well woman exam without gynecological exam Obtain age-appropriate laboratory screenings - CBC with Differential/Platelet - COMPLETE METABOLIC PANEL WITH GFR - Lipid panel - TSH - VITAMIN D 25 Hydroxy (Vit-D Deficiency, Fractures)   Crucita Lacorte Asad A. Faylene KurtzShah [facility] 11/17/2016 3:03 PM

## 2016-11-21 LAB — CBC WITH DIFFERENTIAL/PLATELET
BASOS PCT: 1 %
Basophils Absolute: 83 cells/uL (ref 0–200)
EOS PCT: 4 %
Eosinophils Absolute: 332 cells/uL (ref 15–500)
HCT: 40.1 % (ref 35.0–45.0)
Hemoglobin: 13 g/dL (ref 11.7–15.5)
LYMPHS PCT: 32 %
Lymphs Abs: 2656 cells/uL (ref 850–3900)
MCH: 28.4 pg (ref 27.0–33.0)
MCHC: 32.4 g/dL (ref 32.0–36.0)
MCV: 87.7 fL (ref 80.0–100.0)
MONOS PCT: 7 %
MPV: 9.5 fL (ref 7.5–12.5)
Monocytes Absolute: 581 cells/uL (ref 200–950)
Neutro Abs: 4648 cells/uL (ref 1500–7800)
Neutrophils Relative %: 56 %
PLATELETS: 285 10*3/uL (ref 140–400)
RBC: 4.57 MIL/uL (ref 3.80–5.10)
RDW: 13.4 % (ref 11.0–15.0)
WBC: 8.3 10*3/uL (ref 3.8–10.8)

## 2016-11-22 LAB — LIPID PANEL
CHOL/HDL RATIO: 3.8 ratio (ref <5.0)
Cholesterol: 154 mg/dL (ref <200)
HDL: 41 mg/dL — ABNORMAL LOW (ref >50)
LDL CALC: 90 mg/dL (ref <100)
TRIGLYCERIDES: 115 mg/dL (ref <150)
VLDL: 23 mg/dL (ref <30)

## 2016-11-22 LAB — COMPLETE METABOLIC PANEL WITH GFR
ALT: 11 U/L (ref 6–29)
AST: 17 U/L (ref 10–30)
Albumin: 4.2 g/dL (ref 3.6–5.1)
Alkaline Phosphatase: 36 U/L (ref 33–115)
BUN: 9 mg/dL (ref 7–25)
CHLORIDE: 105 mmol/L (ref 98–110)
CO2: 27 mmol/L (ref 20–31)
CREATININE: 0.63 mg/dL (ref 0.50–1.10)
Calcium: 9 mg/dL (ref 8.6–10.2)
GFR, Est Non African American: >89 mL/min (ref >=60)
Glucose, Bld: 80 mg/dL (ref 65–99)
Potassium: 4.2 mmol/L (ref 3.5–5.3)
Sodium: 139 mmol/L (ref 135–146)
Total Bilirubin: 0.6 mg/dL (ref 0.2–1.2)
Total Protein: 6.6 g/dL (ref 6.1–8.1)

## 2016-11-22 LAB — TSH: TSH: 2.04 mIU/L

## 2016-11-22 LAB — VITAMIN D 25 HYDROXY (VIT D DEFICIENCY, FRACTURES): Vit D, 25-Hydroxy: 23 ng/mL — ABNORMAL LOW (ref 30–100)

## 2016-11-24 ENCOUNTER — Telehealth: Payer: Self-pay

## 2016-11-24 MED ORDER — VITAMIN D (ERGOCALCIFEROL) 1.25 MG (50000 UNIT) PO CAPS: 50000.0000 [IU] | ORAL_CAPSULE | ORAL | 0 refills | Status: DC

## 2016-11-24 NOTE — Telephone Encounter (Signed)
Patient has been notified of lab results and a prescription for vitamin D3 50,000 units take 1 capsule once a week x12 weeks has been sent to [facility] [address] per Dr. Shah, patient has been notified and verbalized understanding  

## 2016-11-28 ENCOUNTER — Encounter: Payer: Self-pay | Admitting: Obstetrics and Gynecology

## 2016-11-28 ENCOUNTER — Ambulatory Visit (INDEPENDENT_AMBULATORY_CARE_PROVIDER_SITE_OTHER): Payer: Managed Care, Other (non HMO) | Admitting: Obstetrics and Gynecology

## 2016-11-28 VITALS — BP 101/70 | HR 108 | Ht 64.0 in | Wt 159.2 lb

## 2016-11-28 DIAGNOSIS — N852 Hypertrophy of uterus: Secondary | ICD-10-CM | POA: Diagnosis not present

## 2016-11-28 DIAGNOSIS — R6882 Decreased libido: Secondary | ICD-10-CM

## 2016-11-28 DIAGNOSIS — N939 Abnormal uterine and vaginal bleeding, unspecified: Secondary | ICD-10-CM

## 2016-11-28 DIAGNOSIS — Z30431 Encounter for routine checking of intrauterine contraceptive device: Secondary | ICD-10-CM

## 2016-11-28 NOTE — Progress Notes (Signed)
Chief complaint: 1. Abnormal uterine bleeding 2. IUD check  Patient presents with multiple complaints including decreased libido, chronic spotting approximately 3 weeks out of every month,, and PMS type symptoms. Prior history was notable for patient not tolerating birth control pills and the ParaGard IUD. Previous exams were notable for a slightly enlarged uterus suspicious for adenomyosis/endometriosis.  Patient is not complaining of any significant dysmenorrhea at this time. She is not complaining of any diffuse pelvic tenderness. Vaginal bleeding is only spotting.  Patient does not report any significant life stressors involving her partner, work, or coworkers. She has not started on any new medications. She is not routinely exercising and has noted a 14 pound weight gain in the past year.  Past medical history, past surgical history, problem list, medications, and allergies are reviewed  OBJECTIVE: BP 101/70   Pulse (!) 108   Ht 5\' 4"  (1.626 m)   Wt 159 lb 3.2 oz (72.2 kg)   LMP  (LMP Unknown) Comment: spotting only  BMI 27.33 kg/m  Pleasant female in no acute distress. She is alert and oriented. Affect is appropriate. Abdomen: Soft, nontender without organomegaly Pelvic exam: External genitalia-normal BUS-normal Vagina-Normal without significant discharge Cervix-IUD strings noted to be 2 cm in length to: No cervical motion tenderness Uterus-top normal size, mobile, nontender Adnexa-nonpalpable nontender Rectovaginal-normal external exam  ASSESSMENT: 1. Decreased libido, unclear etiology, possibly IUD related due to progestin 2. Spotting, likely IUD related without heavy vaginal bleeding 3. Pelvic exam unremarkable today  PLAN: 1. Retained Mirena IUD 2. Maintain menstrual calendar monitoring 3. Return in September for annual exam and further follow-up 4. Strongly encouraged healthy lifestyle including appropriate dilating and exercise.  A total of 15 minutes were spent  face-to-face with the patient during this encounter and over half of that time dealt with counseling and coordination of care.  Herold HarmsMartin A Rihanna Marseille, MD  Note: This dictation was prepared with Dragon dictation along with smaller phrase technology. Any transcriptional errors that result from this process are unintentional.

## 2016-11-28 NOTE — Patient Instructions (Addendum)
1. Recommend keeping the Mirena IUD 2. Continue monitoring bleeding with menstrual calendar 3. Return in September 2018 for annual exam.

## 2017-04-24 DIAGNOSIS — J029 Acute pharyngitis, unspecified: Secondary | ICD-10-CM | POA: Diagnosis not present

## 2017-08-16 DIAGNOSIS — J069 Acute upper respiratory infection, unspecified: Secondary | ICD-10-CM | POA: Diagnosis not present

## 2017-08-16 DIAGNOSIS — J029 Acute pharyngitis, unspecified: Secondary | ICD-10-CM | POA: Diagnosis not present

## 2017-09-16 ENCOUNTER — Inpatient Hospital Stay
Admission: EM | Admit: 2017-09-16 | Discharge: 2017-09-18 | DRG: 443 | Disposition: A | Discharge status: Home / Self Care | Payer: BLUE CROSS/BLUE SHIELD | Attending: Specialist | Admitting: Specialist

## 2017-09-16 ENCOUNTER — Emergency Department: Payer: BLUE CROSS/BLUE SHIELD

## 2017-09-16 ENCOUNTER — Other Ambulatory Visit: Payer: Self-pay

## 2017-09-16 DIAGNOSIS — R1011 Right upper quadrant pain: Secondary | ICD-10-CM | POA: Diagnosis not present

## 2017-09-16 DIAGNOSIS — R634 Abnormal weight loss: Secondary | ICD-10-CM | POA: Diagnosis present

## 2017-09-16 DIAGNOSIS — E876 Hypokalemia: Secondary | ICD-10-CM | POA: Diagnosis not present

## 2017-09-16 DIAGNOSIS — M79606 Pain in leg, unspecified: Secondary | ICD-10-CM | POA: Diagnosis not present

## 2017-09-16 DIAGNOSIS — R932 Abnormal findings on diagnostic imaging of liver and biliary tract: Secondary | ICD-10-CM | POA: Diagnosis not present

## 2017-09-16 DIAGNOSIS — B159 Hepatitis A without hepatic coma: Secondary | ICD-10-CM | POA: Diagnosis not present

## 2017-09-16 DIAGNOSIS — Z975 Presence of (intrauterine) contraceptive device: Secondary | ICD-10-CM

## 2017-09-16 DIAGNOSIS — M549 Dorsalgia, unspecified: Secondary | ICD-10-CM | POA: Diagnosis not present

## 2017-09-16 DIAGNOSIS — R52 Pain, unspecified: Secondary | ICD-10-CM | POA: Diagnosis not present

## 2017-09-16 DIAGNOSIS — R740 Nonspecific elevation of levels of transaminase and lactic acid dehydrogenase [LDH]: Secondary | ICD-10-CM | POA: Diagnosis not present

## 2017-09-16 DIAGNOSIS — Z8249 Family history of ischemic heart disease and other diseases of the circulatory system: Secondary | ICD-10-CM | POA: Diagnosis not present

## 2017-09-16 DIAGNOSIS — Z8709 Personal history of other diseases of the respiratory system: Secondary | ICD-10-CM

## 2017-09-16 DIAGNOSIS — Z791 Long term (current) use of non-steroidal anti-inflammatories (NSAID): Secondary | ICD-10-CM

## 2017-09-16 DIAGNOSIS — R17 Unspecified jaundice: Secondary | ICD-10-CM | POA: Diagnosis present

## 2017-09-16 DIAGNOSIS — R7989 Other specified abnormal findings of blood chemistry: Secondary | ICD-10-CM | POA: Diagnosis not present

## 2017-09-16 DIAGNOSIS — Z8349 Family history of other endocrine, nutritional and metabolic diseases: Secondary | ICD-10-CM

## 2017-09-16 DIAGNOSIS — R7401 Elevation of levels of liver transaminase levels: Secondary | ICD-10-CM

## 2017-09-16 DIAGNOSIS — R945 Abnormal results of liver function studies: Secondary | ICD-10-CM | POA: Diagnosis not present

## 2017-09-16 DIAGNOSIS — Z79899 Other long term (current) drug therapy: Secondary | ICD-10-CM | POA: Diagnosis not present

## 2017-09-16 DIAGNOSIS — R109 Unspecified abdominal pain: Secondary | ICD-10-CM | POA: Diagnosis not present

## 2017-09-16 HISTORY — DX: Hepatitis a without hepatic coma: B15.9

## 2017-09-16 LAB — URINALYSIS, COMPLETE (UACMP) WITH MICROSCOPIC
Bacteria, UA: NONE SEEN
Bilirubin Urine: NEGATIVE
GLUCOSE, UA: NEGATIVE mg/dL
HGB URINE DIPSTICK: NEGATIVE
Ketones, ur: NEGATIVE mg/dL
LEUKOCYTES UA: NEGATIVE
Nitrite: NEGATIVE
PROTEIN: NEGATIVE mg/dL
RBC / HPF: NONE SEEN RBC/hpf (ref 0–5)
Specific Gravity, Urine: 1.005 (ref 1.005–1.030)
pH: 8 (ref 5.0–8.0)

## 2017-09-16 LAB — COMPREHENSIVE METABOLIC PANEL
ALK PHOS: 117 U/L (ref 38–126)
ALT: 1380 U/L — AB (ref 14–54)
AST: 374 U/L — ABNORMAL HIGH (ref 15–41)
Albumin: 3.8 g/dL (ref 3.5–5.0)
Anion gap: 12 (ref 5–15)
BUN: 6 mg/dL (ref 6–20)
CALCIUM: 8.8 mg/dL — AB (ref 8.9–10.3)
CO2: 25 mmol/L (ref 22–32)
CREATININE: 0.48 mg/dL (ref 0.44–1.00)
Chloride: 100 mmol/L — ABNORMAL LOW (ref 101–111)
Glucose, Bld: 86 mg/dL (ref 65–99)
Potassium: 3.3 mmol/L — ABNORMAL LOW (ref 3.5–5.1)
Sodium: 137 mmol/L (ref 135–145)
Total Bilirubin: 7.1 mg/dL — ABNORMAL HIGH (ref 0.3–1.2)
Total Protein: 7.7 g/dL (ref 6.5–8.1)

## 2017-09-16 LAB — ACETAMINOPHEN LEVEL

## 2017-09-16 LAB — CBC
HCT: 40.9 % (ref 35.0–47.0)
Hemoglobin: 13.8 g/dL (ref 12.0–16.0)
MCH: 28.9 pg (ref 26.0–34.0)
MCHC: 33.8 g/dL (ref 32.0–36.0)
MCV: 85.3 fL (ref 80.0–100.0)
PLATELETS: 292 10*3/uL (ref 150–440)
RBC: 4.8 MIL/uL (ref 3.80–5.20)
RDW: 14.6 % — ABNORMAL HIGH (ref 11.5–14.5)
WBC: 8 10*3/uL (ref 3.6–11.0)

## 2017-09-16 LAB — POCT PREGNANCY, URINE: Preg Test, Ur: NEGATIVE

## 2017-09-16 LAB — LIPASE, BLOOD: Lipase: 52 U/L — ABNORMAL HIGH (ref 11–51)

## 2017-09-16 LAB — HCG, QUANTITATIVE, PREGNANCY: hCG, Beta Chain, Quant, S: 1 m[IU]/mL (ref <5)

## 2017-09-16 LAB — BILIRUBIN, DIRECT: Bilirubin, Direct: 4.5 mg/dL — ABNORMAL HIGH (ref 0.1–0.5)

## 2017-09-16 LAB — FERRITIN: Ferritin: 216 ng/mL (ref 11–307)

## 2017-09-16 LAB — IRON: Iron: 88 ug/dL (ref 28–170)

## 2017-09-16 MED ORDER — ONDANSETRON HCL 4 MG PO TABS
4.0000 mg | ORAL_TABLET | Freq: Four times a day (QID) | ORAL | Status: DC | PRN
  Administered 2017-09-17: 4 mg via ORAL
  Filled 2017-09-16: qty 1

## 2017-09-16 MED ORDER — SODIUM CHLORIDE 0.9 % IV BOLUS (SEPSIS)
1000.0000 mL | Freq: Once | INTRAVENOUS | Status: AC
  Administered 2017-09-16: 1000 mL via INTRAVENOUS

## 2017-09-16 MED ORDER — MORPHINE SULFATE (PF) 4 MG/ML IV SOLN
4.0000 mg | Freq: Once | INTRAVENOUS | Status: AC
  Administered 2017-09-16: 4 mg via INTRAVENOUS
  Filled 2017-09-16: qty 1

## 2017-09-16 MED ORDER — ONDANSETRON HCL 4 MG/2ML IJ SOLN
4.0000 mg | Freq: Once | INTRAMUSCULAR | Status: AC
Start: 2017-09-16 — End: 2017-09-16
  Administered 2017-09-16: 4 mg via INTRAVENOUS
  Filled 2017-09-16: qty 2

## 2017-09-16 MED ORDER — HEPARIN SODIUM (PORCINE) 5000 UNIT/ML IJ SOLN
5000.0000 [IU] | Freq: Three times a day (TID) | INTRAMUSCULAR | Status: DC
  Administered 2017-09-16 – 2017-09-17 (×3): 5000 [IU] via SUBCUTANEOUS
  Filled 2017-09-16 (×4): qty 1

## 2017-09-16 MED ORDER — ONDANSETRON HCL 4 MG/2ML IJ SOLN
4.0000 mg | Freq: Four times a day (QID) | INTRAMUSCULAR | Status: DC | PRN
  Administered 2017-09-16 – 2017-09-18 (×3): 4 mg via INTRAVENOUS
  Filled 2017-09-16 (×3): qty 2

## 2017-09-16 MED ORDER — OXYCODONE HCL 5 MG PO TABS
5.0000 mg | ORAL_TABLET | ORAL | Status: DC | PRN
  Administered 2017-09-17 – 2017-09-18 (×2): 5 mg via ORAL
  Filled 2017-09-16 (×2): qty 1

## 2017-09-16 MED ORDER — POTASSIUM CHLORIDE CRYS ER 20 MEQ PO TBCR
40.0000 meq | EXTENDED_RELEASE_TABLET | Freq: Once | ORAL | Status: AC
  Administered 2017-09-16: 40 meq via ORAL
  Filled 2017-09-16: qty 2

## 2017-09-16 MED ORDER — PNEUMOCOCCAL VAC POLYVALENT 25 MCG/0.5ML IJ INJ: 0.5000 mL | INJECTION | INTRAMUSCULAR | Status: DC

## 2017-09-16 MED ORDER — SODIUM CHLORIDE 0.9 % IV SOLN
INTRAVENOUS | Status: DC
  Administered 2017-09-16 – 2017-09-18 (×3): via INTRAVENOUS

## 2017-09-16 MED ORDER — KETOROLAC TROMETHAMINE 30 MG/ML IJ SOLN
15.0000 mg | Freq: Once | INTRAMUSCULAR | Status: AC
  Administered 2017-09-16: 15 mg via INTRAVENOUS
  Filled 2017-09-16: qty 1

## 2017-09-16 NOTE — H&P (Addendum)
[employer] - [place] at [facility]   PATIENT NAME: Christy Farmer    MR#:  [id number]  DATE OF BIRTH:  [birth year]  DATE OF ADMISSION:  09/16/2017  PRIMARY CARE PHYSICIAN: Ellyn Hack, MD   REQUESTING/REFERRING PHYSICIAN: Merrily Brittle MD  CHIEF COMPLAINT:   Chief Complaint  Patient presents with  . Emesis  . Jaundice    HISTORY OF PRESENT ILLNESS: Christy Farmer  is a 31 y.o. female with a known history of asthma as a child who is presenting with complaint thinking of having nausea and vomiting since last week.  Patient states that after Thanksgiving she has not felt well.  She also has been having some back pain and pain radiating down her legs.  Today she noticed her skin was yellowish in color.  She decided to come to the ER.  Patient was noted to have jaundice.  She was also noticed to have significantly elevated liver function tests.  She underwent ultrasound of her gallbladder which showed a dilated common bile duct.  Initially the ER physician and the GI physician felt that this is likely due to quality of cholelithiasis.  However Dr. Lars Pinks of GI felt that this may be related to some other hepatic process due to the fact that her liver function tests are very high.  Patient does report that she visited [place] about a month ago.  She has not had any diarrhea.  She does complaint of some intermittent fever.  She denies any chest pain or palpitations.  She reports a few pounds of weight loss. She also feels that her mouth is dry and her skin feels dry.  PAST MEDICAL HISTORY:   Past Medical History:  Diagnosis Date  . Abnormal uterine bleeding (AUB)   . Allergy    seasonal allergies.  . Asthma   . Constipation   . Enlarged uterus   . Heavy periods   . Vaginal Pap smear, abnormal     PAST SURGICAL HISTORY:  Past Surgical History:  Procedure Laterality Date  . COLPOSCOPY      SOCIAL HISTORY:  Social History   Tobacco Use  . Smoking  status: Never Smoker  . Smokeless tobacco: Never Used  Substance Use Topics  . Alcohol use: Yes    Alcohol/week: 1.2 oz    Types: 2 Glasses of wine per week    FAMILY HISTORY:  Family History  Problem Relation Age of Onset  . Hyperlipidemia Mother   . Hypertension Mother   . Healthy Father   . Healthy Brother   . Cancer Neg Hx   . Diabetes Neg Hx   . Heart disease Neg Hx     DRUG ALLERGIES: No Known Allergies  REVIEW OF SYSTEMS:   CONSTITUTIONAL: No fever, positive fatigue and positive weakness.  EYES: No blurred or double vision.  EARS, NOSE, AND THROAT: No tinnitus or ear pain.  RESPIRATORY: No cough, shortness of breath, wheezing or hemoptysis.  CARDIOVASCULAR: No chest pain, orthopnea, edema.  GASTROINTESTINAL: No nausea, vomiting, diarrhea or abdominal pain.  GENITOURINARY: No dysuria, hematuria.  ENDOCRINE: No polyuria, nocturia,  HEMATOLOGY: No anemia, easy bruising or bleeding SKIN: Positive jaundice MUSCULOSKELETAL: Complains of back pain and pain radiating down her leg NEUROLOGIC: No tingling, numbness, weakness.  PSYCHIATRY: No anxiety or depression.   MEDICATIONS AT HOME:  Prior to Admission medications   Medication Sig Start Date End Date Taking? Authorizing Provider  levonorgestrel (MIRENA) 20 MCG/24HR IUD 1 each by  Intrauterine route once.   Yes [provider]  ibuprofen (ADVIL,MOTRIN) 200 MG tablet Take 200 mg by mouth every 6 (six) hours as needed.    [provider]  Vitamin D, Ergocalciferol, (DRISDOL) 50000 units CAPS capsule Take 1 capsule (50,000 Units total) by mouth once a week. For 12 weeks Patient not taking: Reported on 09/16/2017 11/24/16   Ellyn HackShah, Syed Asad A, MD      PHYSICAL EXAMINATION:   VITAL SIGNS: Blood pressure 118/85, pulse 76, temperature 99.3 F (37.4 C), resp. rate 14, height 5\' 4"  (1.626 m), weight 150 lb (68 kg), SpO2 98 %.  GENERAL:  31 y.o.-year-old patient lying in the bed with no acute distress.  EYES:  Pupils equal, round, reactive to light and accommodation.  Positive scleral icterus. Extraocular muscles intact.  HEENT: Head atraumatic, normocephalic. Oropharynx and nasopharynx clear.  NECK:  Supple, no jugular venous distention. No thyroid enlargement, no tenderness.  LUNGS: Normal breath sounds bilaterally, no wheezing, rales,rhonchi or crepitation. No use of accessory muscles of respiration.  CARDIOVASCULAR: S1, S2 normal. No murmurs, rubs, or gallops.  ABDOMEN: Soft, nontender, nondistended. Bowel sounds present. No organomegaly or mass.  EXTREMITIES: No pedal edema, cyanosis, or clubbing.  NEUROLOGIC: Cranial nerves II through XII are intact. Muscle strength 5/5 in all extremities. Sensation intact. Gait not checked.  PSYCHIATRIC: The patient is alert and oriented x 3.  SKIN: Has jaundiced skin  LABORATORY PANEL:   CBC Recent Labs  Lab 09/16/17 1652  WBC 8.0  HGB 13.8  HCT 40.9  PLT 292  MCV 85.3  MCH 28.9  MCHC 33.8  RDW 14.6*   ------------------------------------------------------------------------------------------------------------------  Chemistries  Recent Labs  Lab 09/16/17 1652  NA 137  K 3.3*  CL 100*  CO2 25  GLUCOSE 86  BUN 6  CREATININE 0.48  CALCIUM 8.8*  AST 374*  ALT 1,380*  ALKPHOS 117  BILITOT 7.1*   ------------------------------------------------------------------------------------------------------------------ estimated creatinine clearance is 96.5 mL/min (by C-G formula based on SCr of 0.48 mg/dL). ------------------------------------------------------------------------------------------------------------------ No results for input(s): TSH, T4TOTAL, T3FREE, THYROIDAB in the last 72 hours.  Invalid input(s): FREET3   Coagulation profile No results for input(s): INR, PROTIME in the last 168 hours. ------------------------------------------------------------------------------------------------------------------- No results for  input(s): DDIMER in the last 72 hours. -------------------------------------------------------------------------------------------------------------------  Cardiac Enzymes No results for input(s): CKMB, TROPONINI, MYOGLOBIN in the last 168 hours.  Invalid input(s): CK ------------------------------------------------------------------------------------------------------------------ Invalid input(s): POCBNP  ---------------------------------------------------------------------------------------------------------------  Urinalysis    Component Value Date/Time   COLORURINE AMBER (A) 09/16/2017 1652   APPEARANCEUR CLEAR (A) 09/16/2017 1652   LABSPEC 1.005 09/16/2017 1652   PHURINE 8.0 09/16/2017 1652   GLUCOSEU NEGATIVE 09/16/2017 1652   HGBUR NEGATIVE 09/16/2017 1652   BILIRUBINUR NEGATIVE 09/16/2017 1652   KETONESUR NEGATIVE 09/16/2017 1652   PROTEINUR NEGATIVE 09/16/2017 1652   NITRITE NEGATIVE 09/16/2017 1652   LEUKOCYTESUR NEGATIVE 09/16/2017 1652     RADIOLOGY: [place] Abdomen Limited Ruq  Result Date: 09/16/2017 CLINICAL DATA:  Abdominal pain for 1 and 1/2 weeks with nausea and vomiting. EXAM: ULTRASOUND ABDOMEN LIMITED RIGHT UPPER QUADRANT COMPARISON:  None. FINDINGS: Gallbladder: Gallbladder is mostly contracted. No gallstones. No significant wall thickening. No pericholecystic fluid. Common bile duct: Diameter: 7 mm. No duct stone visualized. Distal aspect of the duct was obscured by bowel gas. Liver: No focal lesion identified. Within normal limits in parenchymal echogenicity. Portal vein is patent on color Doppler imaging with normal direction of blood flow towards the liver. IMPRESSION: 1. Dilated common bile duct. No duct  stone visualized. If there are symptoms of biliary obstruction, follow-up ERCP or MRCP would be recommended. 2. No other abnormalities.  No evidence of acute cholecystitis. Electronically Signed   By: Amie Portlandavid  Ormond M.D.   On: 09/16/2017 18:27    EKG: No  orders found for this or any previous visit.  IMPRESSION AND PLAN: Patient is a [age] year old presenting with painless jaundice  1.  Elevated liver function tests this could be primary liver inflammatory process GI has been notified, they may consider doing ERCP, if no ERCP is performed recommend doing MRCP I will initiate workup for elevated liver function tests    -Viral hepatitis panel to evaluate for hepatitis A, B, and C   -Viral hepatitis panel looking at EBV and CMV   -HIV   -Autoimmune workup ; ANA, SPEP, immunoglobulin levels, anti-liver kidney body   -Wilson's disease-check ceruloplasmin level   -Hemochromatosis -check ferritin and iron levels   -Tylenol levels are ordered currently pending   - anti mitochondrial antibodies to evaluate for -  PBC  2.  Hypokalemia replace potassium  3.  Miscellaneous heparin for DVT prophylaxis    All the records are reviewed and case discussed with ED provider. Management plans discussed with the patient, family and they are in agreement.  CODE STATUS: Code Status History    This patient does not have a recorded code status. Please follow your organizational policy for patients in this situation.       TOTAL TIME TAKING CARE OF THIS PATIENT: 55 minutes.    Auburn BilberryPATEL, Lazette Estala M.D on 09/16/2017 at 8:02 PM  Between 7am to 6pm - Pager - [phone]  After 6pm go to [email or web address] - password [email or web address] [facility]  [facility]  Office  [phone]  CC: Primary care physician; Ellyn HackShah, Syed Asad A, MD

## 2017-09-16 NOTE — ED Triage Notes (Signed)
Pt presents via EMS c/o dark urine, N/V, and jaundice per pt report. [place]. Reports some RLQ discomfort.

## 2017-09-16 NOTE — ED Provider Notes (Signed)
[facility] Emergency Department Provider Note  ____________________________________________   First MD Initiated Contact with Patient 09/16/17 1700     (approximate)  I have reviewed the triage vital signs and the nursing notes.   HISTORY  Chief Complaint Emesis and Jaundice   HPI Christy Farmer is a 31 y.o. female is self presents to the emergency department with 1 day of yellowing to her skin and eyes.  Or so she has had nonspecific intermittent moderate severity diffuse abdominal discomfort with some nausea.  Seems to be somewhat worse with food and somewhat improved when not eating.  She has no history of abdominal surgeries.  She has noticed also 1 week of dark yellow urine that does not seem to change after drinking water.  Her pain is moderate severity intermittent nonradiating  Past Medical History:  Diagnosis Date  . Abnormal uterine bleeding (AUB)   . Allergy    seasonal allergies.  . Asthma   . Constipation   . Enlarged uterus   . Heavy periods   . Vaginal Pap smear, abnormal     Patient Active Problem List   Diagnosis Date Noted  . Well woman exam without gynecological exam 11/17/2016  . Contact dermatitis 04/28/2016  . Skin rash 04/24/2016  . Annual physical exam 12/01/2015  . Sore throat 12/01/2015  . Viral pharyngitis 08/18/2015  . Encounter to establish care with new doctor 08/13/2015  . Asthma 08/13/2015  . History of abnormal cervical Pap smear 06/15/2015  . Enlarged uterus 06/15/2015  . Menorrhagia 06/15/2015  . Dysmenorrhea 06/15/2015    Past Surgical History:  Procedure Laterality Date  . COLPOSCOPY      Prior to Admission medications   Medication Sig Start Date End Date Taking? Authorizing Provider  fluocinonide (LIDEX) 0.05 % external solution Apply 1 application topically 2 (two) times daily. 02/20/17 02/20/18 Yes [provider]  ibuprofen (ADVIL,MOTRIN) 200 MG tablet Take 200 mg by mouth every 6  (six) hours as needed.    [provider]  levonorgestrel (MIRENA) 20 MCG/24HR IUD 1 each by Intrauterine route once.    [provider]  methylPREDNISolone (MEDROL DOSEPAK) 4 MG TBPK tablet taper from 4 doses each day to 1 dose and stop. 08/16/17   [provider]  Vitamin D, Ergocalciferol, (DRISDOL) 50000 units CAPS capsule Take 1 capsule (50,000 Units total) by mouth once a week. For 12 weeks 11/24/16   Ellyn HackShah, Syed Asad A, MD    Allergies Patient has no known allergies.  Family History  Problem Relation Age of Onset  . Hyperlipidemia Mother   . Hypertension Mother   . Healthy Father   . Healthy Brother   . Cancer Neg Hx   . Diabetes Neg Hx   . Heart disease Neg Hx     Social History Social History   Tobacco Use  . Smoking status: Never Smoker  . Smokeless tobacco: Never Used  Substance Use Topics  . Alcohol use: Yes    Alcohol/week: 1.2 oz    Types: 2 Glasses of wine per week  . Drug use: No    Review of Systems Constitutional: No fever/chills Eyes: No visual changes. ENT: No sore throat. Cardiovascular: Denies chest pain. Respiratory: Denies shortness of breath. Gastrointestinal: Positive for abdominal pain.  Positive for nausea, no vomiting.  No diarrhea.  No constipation. Genitourinary: Negative for dysuria. Musculoskeletal: Negative for back pain. Skin: Negative for rash. Neurological: Negative for headaches, focal weakness or numbness.   ____________________________________________  PHYSICAL EXAM:  VITAL SIGNS: ED Triage Vitals  Enc Vitals Group     BP 09/16/17 1651 118/85     Pulse Rate 09/16/17 1651 76     Resp 09/16/17 1651 14     Temp 09/16/17 1651 99.3 F (37.4 C)     Temp src --      SpO2 09/16/17 1651 98 %     Weight 09/16/17 1652 150 lb (68 kg)     Height 09/16/17 1652 5\' 4"  (1.626 m)     Head Circumference --      Peak Flow --      Pain Score 09/16/17 1651 2     Pain Loc --      Pain Edu? --      Excl. in  GC? --     Constitutional: Alert and oriented x4 well-appearing nontoxic no diaphoresis speaks in full clear sentences Eyes: PERRL EOMI. some icterus Head: Atraumatic. Nose: No congestion/rhinnorhea. Mouth/Throat: No trismus Neck: No stridor.   Cardiovascular: Normal rate, regular rhythm. Grossly normal heart sounds.  Good peripheral circulation. Respiratory: Normal respiratory effort.  No retractions. Lungs CTAB and moving good air Gastrointestinal: Soft nondistended she is tender in her right upper quadrant although with negative Murphy's no rebound or guarding no frank peritonitis Musculoskeletal: No lower extremity edema   Neurologic:  Normal speech and language. No gross focal neurologic deficits are appreciated. Skin: Mildly jaundiced. Psychiatric: Mood and affect are normal. Speech and behavior are normal.    ____________________________________________   DIFFERENTIAL includes but not limited to  Biliary obstruction, choledocholithiasis, cholangitis, cholecystitis, hemolytic anemia, pancreatic cancer ____________________________________________   LABS (all labs ordered are listed, but only abnormal results are displayed)  Labs Reviewed  LIPASE, BLOOD - Abnormal; Notable for the following components:      Result Value   Lipase 52 (*)    All other components within normal limits  COMPREHENSIVE METABOLIC PANEL - Abnormal; Notable for the following components:   Potassium 3.3 (*)    Chloride 100 (*)    Calcium 8.8 (*)    AST 374 (*)    ALT 1,380 (*)    Total Bilirubin 7.1 (*)    All other components within normal limits  CBC - Abnormal; Notable for the following components:   RDW 14.6 (*)    All other components within normal limits  URINALYSIS, COMPLETE (UACMP) WITH MICROSCOPIC - Abnormal; Notable for the following components:   Color, Urine AMBER (*)    APPearance CLEAR (*)    Squamous Epithelial / LPF 0-5 (*)    All other components within normal limits    BILIRUBIN, DIRECT - Abnormal; Notable for the following components:   Bilirubin, Direct 4.5 (*)    All other components within normal limits  HCG, QUANTITATIVE, PREGNANCY  HEPATITIS PANEL, ACUTE  ACETAMINOPHEN LEVEL  POCT PREGNANCY, URINE    Blood work reviewed by me shows transaminitis along with elevated bilirubin __________________________________________  EKG   ____________________________________________  RADIOLOGY  Right upper quadrant ultrasound reviewed by me shows dilated common bile duct ____________________________________________   PROCEDURES  Procedure(s) performed: no  Procedures  Critical Care performed: no  Observation: no ____________________________________________   INITIAL IMPRESSION / ASSESSMENT AND PLAN / ED COURSE  Pertinent labs & imaging results that were available during my care of the patient were reviewed by me and considered in my medical decision making (see chart for details).  On arrival the patient is clearly jaundiced with right upper quadrant tenderness.  Blood  work and ultrasound are pending.     ----------------------------------------- 7:21 PM on 09/16/2017 -----------------------------------------  I discussed the case with on-call gastroenterologist Dr. Servando SnareWohl who will kindly consult on the case.  He feels that the patient may not actually have choledocholithiasis and this could be viral hepatitis versus an acetaminophen overdose.  I discussed with the patient and her husband at bedside and they agree with inpatient admission.  I then discussed with the hospitalist who is graciously agreed to admit the patient to his service.  Dr. Servando SnareWohl does recommend that the patient be NPO at midnight. ____________________________________________   FINAL CLINICAL IMPRESSION(S) / ED DIAGNOSES  Final diagnoses:  Pain  Transaminitis      NEW MEDICATIONS STARTED DURING THIS VISIT:  This SmartLink is deprecated. Use AVSMEDLIST instead  to display the medication list for a patient.   Note:  This document was prepared using Dragon voice recognition software and may include unintentional dictation errors.     Merrily Brittleifenbark, Aladdin Kollmann, MD 09/16/17 Ernestina [id number]

## 2017-09-17 ENCOUNTER — Inpatient Hospital Stay: Payer: BLUE CROSS/BLUE SHIELD

## 2017-09-17 DIAGNOSIS — R740 Nonspecific elevation of levels of transaminase and lactic acid dehydrogenase [LDH]: Secondary | ICD-10-CM

## 2017-09-17 DIAGNOSIS — R17 Unspecified jaundice: Secondary | ICD-10-CM

## 2017-09-17 LAB — COMPREHENSIVE METABOLIC PANEL
ALK PHOS: 110 U/L (ref 38–126)
ALT: 1158 U/L — AB (ref 14–54)
AST: 308 U/L — ABNORMAL HIGH (ref 15–41)
Albumin: 3.5 g/dL (ref 3.5–5.0)
Anion gap: 8 (ref 5–15)
BUN: <5 mg/dL — ABNORMAL LOW (ref 6–20)
CALCIUM: 8.3 mg/dL — AB (ref 8.9–10.3)
CO2: 23 mmol/L (ref 22–32)
CREATININE: 0.37 mg/dL — AB (ref 0.44–1.00)
Chloride: 106 mmol/L (ref 101–111)
Glucose, Bld: 103 mg/dL — ABNORMAL HIGH (ref 65–99)
Potassium: 4.1 mmol/L (ref 3.5–5.1)
Sodium: 137 mmol/L (ref 135–145)
TOTAL PROTEIN: 7.2 g/dL (ref 6.5–8.1)
Total Bilirubin: 7.1 mg/dL — ABNORMAL HIGH (ref 0.3–1.2)

## 2017-09-17 LAB — PROTIME-INR
INR: 1.01
PROTHROMBIN TIME: 13.2 s (ref 11.4–15.2)

## 2017-09-17 LAB — CK: Total CK: 47 U/L (ref 38–234)

## 2017-09-17 LAB — CBC
HCT: 42 % (ref 35.0–47.0)
Hemoglobin: 14.1 g/dL (ref 12.0–16.0)
MCH: 29.2 pg (ref 26.0–34.0)
MCHC: 33.7 g/dL (ref 32.0–36.0)
MCV: 86.5 fL (ref 80.0–100.0)
PLATELETS: 291 10*3/uL (ref 150–440)
RBC: 4.85 MIL/uL (ref 3.80–5.20)
RDW: 15.1 % — ABNORMAL HIGH (ref 11.5–14.5)
WBC: 7.2 10*3/uL (ref 3.6–11.0)

## 2017-09-17 MED ORDER — GADOBENATE DIMEGLUMINE 529 MG/ML IV SOLN
15.0000 mL | Freq: Once | INTRAVENOUS | Status: AC | PRN
  Administered 2017-09-17: 14 mL via INTRAVENOUS

## 2017-09-17 MED ORDER — IBUPROFEN 400 MG PO TABS
400.0000 mg | ORAL_TABLET | Freq: Four times a day (QID) | ORAL | Status: DC | PRN
  Administered 2017-09-17: 400 mg via ORAL
  Filled 2017-09-17: qty 1

## 2017-09-17 MED ORDER — PROMETHAZINE HCL 25 MG/ML IJ SOLN: 25.0000 mg | Freq: Four times a day (QID) | INTRAMUSCULAR | Status: DC | PRN

## 2017-09-17 NOTE — Consult Note (Signed)
Cephas Darby, MD [address]  [place]  [place], [place] [address]  Main: [phone]  Fax: [phone] Pager: [phone]   Consultation  Referring Provider:     No ref. provider found Primary Care Physician:  Roselee Nova, MD Primary Gastroenterologist:  None        Reason for Consultation:     Elevated LFTs  Date of Admission:  09/16/2017 Date of Consultation:  09/17/2017         HPI:   Christy Farmer is a 31 y.o. female who was in normal state of health until 4weeks ago, presents with malaise, nausea, yellow eyes, dark urina and light colored stools. She noticed turning yellow about 2 days ago. She went to [place] recently with her husband and 40y/o son to attend wedding. She was there for 2weeks and felt fine. She had "cold" after returning on October 30th which lasted for about a week, took nyquil and flonase. About a week ago, she had molar tooth removed and was taking tylenol alternating with advil for about a week. During this time, she also had 1 day of fever, severe muscle aches in bilateral lower extremities followed by loose light colored BMs for 2 days. Pt denies RUQ pain. She denies rash  She is noted to have significantly elevated transaminases ALT> AST, T Bili 7.1, D Bili 4.5, AP normal. [place] with prominent CBD, MRCP showed normal caliber CBD, no stones. Contracted gall bladder. She feels very thirsty  She denies antibiotic use, prescription meds or herbal supplements. Her LFTs were normal in 11/2016 Urine pregnancy test negative  She denies food poisoning, ETOH use, IVDA Sexually active only with her husband Has IUD  Denies blood transfusion in the past Denies sore throat, pain, swelling of lower jaw No GI surgeries Denies fam h/o liver disease NSAIDs:  As above  Antiplts/Anticoagulants/Anti thrombotics: none  GI Procedures: none  Past Medical History:  Diagnosis Date  . Abnormal uterine bleeding (AUB)   . Allergy    seasonal  allergies.  . Asthma   . Constipation   . Enlarged uterus   . Heavy periods   . Vaginal Pap smear, abnormal     Past Surgical History:  Procedure Laterality Date  . COLPOSCOPY      Prior to Admission medications   Medication Sig Start Date End Date Taking? Authorizing Provider  levonorgestrel (MIRENA) 20 MCG/24HR IUD 1 each by Intrauterine route once.   Yes [provider]  ibuprofen (ADVIL,MOTRIN) 200 MG tablet Take 200 mg by mouth every 6 (six) hours as needed.    [provider]  Vitamin D, Ergocalciferol, (DRISDOL) 50000 units CAPS capsule Take 1 capsule (50,000 Units total) by mouth once a week. For 12 weeks Patient not taking: Reported on 09/16/2017 11/24/16   Roselee Nova, MD    Family History  Problem Relation Age of Onset  . Hyperlipidemia Mother   . Hypertension Mother   . Healthy Father   . Healthy Brother   . Cancer Neg Hx   . Diabetes Neg Hx   . Heart disease Neg Hx      Social History   Tobacco Use  . Smoking status: Never Smoker  . Smokeless tobacco: Never Used  Substance Use Topics  . Alcohol use: Yes    Alcohol/week: 1.2 oz    Types: 2 Glasses of wine per week  . Drug use: No    Allergies as of 09/16/2017  . (  No Known Allergies)    Review of Systems:    All systems reviewed and negative except where noted in HPI.   Physical Exam:  Vital signs in last 24 hours: Temp:  [97.8 F (36.6 C)-99.3 F (37.4 C)] 97.8 F (36.6 C) (12/03 1333) Pulse Rate:  [62-82] 80 (12/03 1333) Resp:  [14-16] 16 (12/03 1333) BP: (92-123)/(55-93) 123/70 (12/03 1333) SpO2:  [96 %-100 %] 96 % (12/03 1333) Weight:  [150 lb (68 kg)-156 lb 1.6 oz (70.8 kg)] 156 lb 1.6 oz (70.8 kg) (12/02 2056) Last BM Date: 09/16/17 General:   Pleasant, cooperative in NAD Head:  Normocephalic and atraumatic. Eyes:  deep icterus.   Conjunctiva pink. PERRLA. Ears:  Normal auditory acuity. Neck:  Supple; no masses or thyroidomegaly Lungs: Respirations even and  unlabored. Lungs clear to auscultation bilaterally.   No wheezes, crackles, or rhonchi.  Heart:  Regular rate and rhythm;  Without murmur, clicks, rubs or gallops Abdomen:  Soft, nondistended, nontender. Normal bowel sounds. No appreciable masses or hepatomegaly.  No rebound or guarding.  Rectal:  Not performed. Msk:  Symmetrical without gross deformities.  Strength normal Extremities:  Without edema, cyanosis or clubbing. Neurologic:  Alert and oriented x3;  grossly normal neurologically. Skin:  Intact without significant lesions or rashes. Cervical Nodes:  No significant cervical adenopathy. Psych:  Alert and cooperative. Normal affect.  LAB RESULTS: CBC Latest Ref Rng & Units 09/17/2017 09/16/2017 11/21/2016  WBC 3.6 - 11.0 K/uL 7.2 8.0 8.3  Hemoglobin 12.0 - 16.0 g/dL 14.1 13.8 13.0  Hematocrit 35.0 - 47.0 % 42.0 40.9 40.1  Platelets 150 - 440 K/uL 291 292 285    BMET BMP Latest Ref Rng & Units 09/17/2017 09/16/2017 11/21/2016  Glucose 65 - 99 mg/dL 103(H) 86 80  BUN 6 - 20 mg/dL <5(L) 6 9  Creatinine 0.44 - 1.00 mg/dL 0.37(L) 0.48 0.63  BUN/Creat Ratio 8 - 20 - - -  Sodium 135 - 145 mmol/L 137 137 139  Potassium 3.5 - 5.1 mmol/L 4.1 3.3(L) 4.2  Chloride 101 - 111 mmol/L 106 100(L) 105  CO2 22 - 32 mmol/L '23 25 27  ' Calcium 8.9 - 10.3 mg/dL 8.3(L) 8.8(L) 9.0    LFT Hepatic Function Latest Ref Rng & Units 09/17/2017 09/16/2017 11/21/2016  Total Protein 6.5 - 8.1 g/dL 7.2 7.7 6.6  Albumin 3.5 - 5.0 g/dL 3.5 3.8 4.2  AST 15 - 41 U/L 308(H) 374(H) 17  ALT 14 - 54 U/L 1,158(H) 1,380(H) 11  Alk Phosphatase 38 - 126 U/L 110 117 36  Total Bilirubin 0.3 - 1.2 mg/dL 7.1(H) 7.1(H) 0.6  Bilirubin, Direct 0.1 - 0.5 mg/dL - 4.5(H) -     STUDIES: Mr 3d Recon At Scanner  Result Date: 09/17/2017 CLINICAL DATA:  Abnormal liver function tests.  Jaundice. EXAM: MRI ABDOMEN WITHOUT AND WITH CONTRAST (INCLUDING MRCP) TECHNIQUE: Multiplanar multisequence MR imaging of the abdomen was performed both  before and after the administration of intravenous contrast. Heavily T2-weighted images of the biliary and pancreatic ducts were obtained, and three-dimensional MRCP images were rendered by post processing. CONTRAST:  33m MULTIHANCE GADOBENATE DIMEGLUMINE 529 MG/ML IV SOLN COMPARISON:  Ultrasound dated 09/16/2017 FINDINGS: Lower chest: Unremarkable Hepatobiliary: No findings of hepatic steatosis. Thick-walled small caliber gallbladder. The common bile duct measures up to 6 mm in diameter, mildly dilated for age, and demonstrates normal conical tapering course distally without observed filling defect. No significant intrahepatic biliary dilatation. No focal abnormality in the liver or specific abnormal hepatic enhancement  is identified. No morphologic features of cirrhosis. Pancreas:  Unremarkable Spleen:  Unremarkable Adrenals/Urinary Tract:  Unremarkable Stomach/Bowel: Unremarkable Vascular/Lymphatic:  Unremarkable Other:  No supplemental non-categorized findings. Musculoskeletal: Unremarkable IMPRESSION: 1. The gallbladder is thick-walled and small in caliber, potentially from inflammation or contraction. Similar appearance to the prior ultrasound. 2. Mildly dilated common bile duct at 6 mm diameter, but without obvious filling defect to suggest a cause for obstruction. The distal CBD appears to taper in a conical fashion. Possibilities for the patient's elevated AST and ALT along with this appearance may include hepatocellular dysfunction with incidental mild biliary dilatation, or some type of mild ampullary stricture. I do not see significant abnormal enhancement in the ampulla and the dorsal pancreatic duct is of normal caliber. If further imaging workup is warranted, nuclear medicine hepatobiliary scan with gallbladder ejection fraction assessment could be utilized to assess for hepatocellular function, patency of the CBD, as well as gallbladder function. Electronically Signed   By: Van Clines M.D.    On: 09/17/2017 10:34   Mr Abdomen Mrcp Moise Boring Contast  Result Date: 09/17/2017 CLINICAL DATA:  Abnormal liver function tests.  Jaundice. EXAM: MRI ABDOMEN WITHOUT AND WITH CONTRAST (INCLUDING MRCP) TECHNIQUE: Multiplanar multisequence MR imaging of the abdomen was performed both before and after the administration of intravenous contrast. Heavily T2-weighted images of the biliary and pancreatic ducts were obtained, and three-dimensional MRCP images were rendered by post processing. CONTRAST:  52m MULTIHANCE GADOBENATE DIMEGLUMINE 529 MG/ML IV SOLN COMPARISON:  Ultrasound dated 09/16/2017 FINDINGS: Lower chest: Unremarkable Hepatobiliary: No findings of hepatic steatosis. Thick-walled small caliber gallbladder. The common bile duct measures up to 6 mm in diameter, mildly dilated for age, and demonstrates normal conical tapering course distally without observed filling defect. No significant intrahepatic biliary dilatation. No focal abnormality in the liver or specific abnormal hepatic enhancement is identified. No morphologic features of cirrhosis. Pancreas:  Unremarkable Spleen:  Unremarkable Adrenals/Urinary Tract:  Unremarkable Stomach/Bowel: Unremarkable Vascular/Lymphatic:  Unremarkable Other:  No supplemental non-categorized findings. Musculoskeletal: Unremarkable IMPRESSION: 1. The gallbladder is thick-walled and small in caliber, potentially from inflammation or contraction. Similar appearance to the prior ultrasound. 2. Mildly dilated common bile duct at 6 mm diameter, but without obvious filling defect to suggest a cause for obstruction. The distal CBD appears to taper in a conical fashion. Possibilities for the patient's elevated AST and ALT along with this appearance may include hepatocellular dysfunction with incidental mild biliary dilatation, or some type of mild ampullary stricture. I do not see significant abnormal enhancement in the ampulla and the dorsal pancreatic duct is of normal caliber. If  further imaging workup is warranted, nuclear medicine hepatobiliary scan with gallbladder ejection fraction assessment could be utilized to assess for hepatocellular function, patency of the CBD, as well as gallbladder function. Electronically Signed   By: WVan ClinesM.D.   On: 09/17/2017 10:34   [place]dom[place] Limited Ruq  Result Date: 09/16/2017 CLINICAL DATA:  Abdominal pain for 1 and 1/2 weeks with nausea and vomiting. EXAM: ULTRASOUND ABDOMEN LIMITED RIGHT UPPER QUADRANT COMPARISON:  None. FINDINGS: Gallbladder: Gallbladder is mostly contracted. No gallstones. No significant wall thickening. No pericholecystic fluid. Common bile duct: Diameter: 7 mm. No duct stone visualized. Distal aspect of the duct was obscured by bowel gas. Liver: No focal lesion identified. Within normal limits in parenchymal echogenicity. Portal vein is patent on color Doppler imaging with normal direction of blood flow towards the liver. IMPRESSION: 1. Dilated common bile duct. No duct stone visualized. If  there are symptoms of biliary obstruction, follow-up ERCP or MRCP would be recommended. 2. No other abnormalities.  No evidence of acute cholecystitis. Electronically Signed   By: Lajean Manes M.D.   On: 09/16/2017 18:27      Impression / Plan:   Christy Farmer is a 31 y.o. female with no PMH recent trip to [place], admitted with acute hepatitis, predominantly hepatocellular pattern and probably viral etiology given her prodrome of symptoms and component of NSAID and tylenol use. Ferritin, TSH normal. MRCP unremarkable for bile duct obstruction No signs or symptoms of acute liver failure  - Monitor LFTs - Check total CK - Check EBV, CMV, HSV - Check acute hepatitis panel - HIV - Autoimmune serologies ordered - Advance diet - If LFTs improving, can discharge home in next 1-2 days  Thank you for involving me in the care of this patient.      LOS: 1 day   Sherri Sear, MD  09/17/2017, 4:42  PM   Note: This dictation was prepared with Dragon dictation along with smaller phrase technology. Any transcriptional errors that result from this process are unintentional.

## 2017-09-17 NOTE — Progress Notes (Signed)
[facility]   PATIENT NAME: Christy Farmer    MR#:  [id number]  DATE OF BIRTH:  April 19, 1986  SUBJECTIVE:   Patient here due to lower extremity pain and noted to be jaundiced with abnormal LFTs. Patient still complaining of some intermittent nausea. LFTs are stable and bilirubins are about the same. No other complaints but wants to eat something.  REVIEW OF SYSTEMS:    Review of Systems  Constitutional: Negative for chills and fever.  HENT: Negative for congestion and tinnitus.   Eyes: Negative for blurred vision and double vision.  Respiratory: Negative for cough, shortness of breath and wheezing.   Cardiovascular: Negative for chest pain, orthopnea and PND.  Gastrointestinal: Positive for nausea. Negative for abdominal pain, diarrhea and vomiting.  Genitourinary: Negative for dysuria and hematuria.  Neurological: Negative for dizziness, sensory change and focal weakness.  All other systems reviewed and are negative.   Nutrition: Full liquids Tolerating Diet: Yes Tolerating PT: Ambulatory   DRUG ALLERGIES:  No Known Allergies  VITALS:  Blood pressure 123/70, pulse 80, temperature 97.8 F (36.6 C), temperature source Oral, resp. rate 16, height 5\' 4"  (1.626 m), weight 70.8 kg (156 lb 1.6 oz), SpO2 96 %.  PHYSICAL EXAMINATION:   Physical Exam  GENERAL:  31 y.o.-year-old patient lying in bed in no acute distress.  EYES: Pupils equal, round, reactive to light and accommodation. + scleral icterus. Extraocular muscles intact.  HEENT: Head atraumatic, normocephalic. Oropharynx and nasopharynx clear.  NECK:  Supple, no jugular venous distention. No thyroid enlargement, no tenderness.  LUNGS: Normal breath sounds bilaterally, no wheezing, rales, rhonchi. No use of accessory muscles of respiration.  CARDIOVASCULAR: S1, S2 normal. No murmurs, rubs, or gallops.  ABDOMEN: Soft, nontender, nondistended. Bowel sounds present. No organomegaly  or mass.  EXTREMITIES: No cyanosis, clubbing or edema b/l.    NEUROLOGIC: Cranial nerves II through XII are intact. No focal Motor or sensory deficits b/l.   PSYCHIATRIC: The patient is alert and oriented x 3.  SKIN: No obvious rash, lesion, or ulcer.    LABORATORY PANEL:   CBC Recent Labs  Lab 09/17/17 0445  WBC 7.2  HGB 14.1  HCT 42.0  PLT 291   ------------------------------------------------------------------------------------------------------------------  Chemistries  Recent Labs  Lab 09/17/17 0445  NA 137  K 4.1  CL 106  CO2 23  GLUCOSE 103*  BUN <5*  CREATININE 0.37*  CALCIUM 8.3*  AST 308*  ALT 1,158*  ALKPHOS 110  BILITOT 7.1*   ------------------------------------------------------------------------------------------------------------------  Cardiac Enzymes No results for input(s): TROPONINI in the last 168 hours. ------------------------------------------------------------------------------------------------------------------  RADIOLOGY:  Mr 3d Recon At Scanner  Result Date: 09/17/2017 CLINICAL DATA:  Abnormal liver function tests.  Jaundice. EXAM: MRI ABDOMEN WITHOUT AND WITH CONTRAST (INCLUDING MRCP) TECHNIQUE: Multiplanar multisequence MR imaging of the abdomen was performed both before and after the administration of intravenous contrast. Heavily T2-weighted images of the biliary and pancreatic ducts were obtained, and three-dimensional MRCP images were rendered by post processing. CONTRAST:  14mL MULTIHANCE GADOBENATE DIMEGLUMINE 529 MG/ML IV SOLN COMPARISON:  Ultrasound dated 09/16/2017 FINDINGS: Lower chest: Unremarkable Hepatobiliary: No findings of hepatic steatosis. Thick-walled small caliber gallbladder. The common bile duct measures up to 6 mm in diameter, mildly dilated for age, and demonstrates normal conical tapering course distally without observed filling defect. No significant intrahepatic biliary dilatation. No focal abnormality in the  liver or specific abnormal hepatic enhancement is identified. No morphologic features of cirrhosis. Pancreas:  Unremarkable Spleen:  Unremarkable Adrenals/Urinary Tract:  Unremarkable Stomach/Bowel: Unremarkable Vascular/Lymphatic:  Unremarkable Other:  No supplemental non-categorized findings. Musculoskeletal: Unremarkable IMPRESSION: 1. The gallbladder is thick-walled and small in caliber, potentially from inflammation or contraction. Similar appearance to the prior ultrasound. 2. Mildly dilated common bile duct at 6 mm diameter, but without obvious filling defect to suggest a cause for obstruction. The distal CBD appears to taper in a conical fashion. Possibilities for the patient's elevated AST and ALT along with this appearance may include hepatocellular dysfunction with incidental mild biliary dilatation, or some type of mild ampullary stricture. I do not see significant abnormal enhancement in the ampulla and the dorsal pancreatic duct is of normal caliber. If further imaging workup is warranted, nuclear medicine hepatobiliary scan with gallbladder ejection fraction assessment could be utilized to assess for hepatocellular function, patency of the CBD, as well as gallbladder function. Electronically Signed   By: Gaylyn RongWalter  Liebkemann M.D.   On: 09/17/2017 10:34   Mr Abdomen Mrcp Vivien RossettiW Wo Contast  Result Date: 09/17/2017 CLINICAL DATA:  Abnormal liver function tests.  Jaundice. EXAM: MRI ABDOMEN WITHOUT AND WITH CONTRAST (INCLUDING MRCP) TECHNIQUE: Multiplanar multisequence MR imaging of the abdomen was performed both before and after the administration of intravenous contrast. Heavily T2-weighted images of the biliary and pancreatic ducts were obtained, and three-dimensional MRCP images were rendered by post processing. CONTRAST:  14mL MULTIHANCE GADOBENATE DIMEGLUMINE 529 MG/ML IV SOLN COMPARISON:  Ultrasound dated 09/16/2017 FINDINGS: Lower chest: Unremarkable Hepatobiliary: No findings of hepatic steatosis.  Thick-walled small caliber gallbladder. The common bile duct measures up to 6 mm in diameter, mildly dilated for age, and demonstrates normal conical tapering course distally without observed filling defect. No significant intrahepatic biliary dilatation. No focal abnormality in the liver or specific abnormal hepatic enhancement is identified. No morphologic features of cirrhosis. Pancreas:  Unremarkable Spleen:  Unremarkable Adrenals/Urinary Tract:  Unremarkable Stomach/Bowel: Unremarkable Vascular/Lymphatic:  Unremarkable Other:  No supplemental non-categorized findings. Musculoskeletal: Unremarkable IMPRESSION: 1. The gallbladder is thick-walled and small in caliber, potentially from inflammation or contraction. Similar appearance to the prior ultrasound. 2. Mildly dilated common bile duct at 6 mm diameter, but without obvious filling defect to suggest a cause for obstruction. The distal CBD appears to taper in a conical fashion. Possibilities for the patient's elevated AST and ALT along with this appearance may include hepatocellular dysfunction with incidental mild biliary dilatation, or some type of mild ampullary stricture. I do not see significant abnormal enhancement in the ampulla and the dorsal pancreatic duct is of normal caliber. If further imaging workup is warranted, nuclear medicine hepatobiliary scan with gallbladder ejection fraction assessment could be utilized to assess for hepatocellular function, patency of the CBD, as well as gallbladder function. Electronically Signed   By: Gaylyn RongWalter  Liebkemann M.D.   On: 09/17/2017 10:34   [place] Abdomen Limited Ruq  Result Date: 09/16/2017 CLINICAL DATA:  Abdominal pain for 1 and 1/2 weeks with nausea and vomiting. EXAM: ULTRASOUND ABDOMEN LIMITED RIGHT UPPER QUADRANT COMPARISON:  None. FINDINGS: Gallbladder: Gallbladder is mostly contracted. No gallstones. No significant wall thickening. No pericholecystic fluid. Common bile duct: Diameter: 7 mm. No duct  stone visualized. Distal aspect of the duct was obscured by bowel gas. Liver: No focal lesion identified. Within normal limits in parenchymal echogenicity. Portal vein is patent on color Doppler imaging with normal direction of blood flow towards the liver. IMPRESSION: 1. Dilated common bile duct. No duct stone visualized. If there are symptoms of biliary obstruction, follow-up ERCP or MRCP would be  recommended. 2. No other abnormalities.  No evidence of acute cholecystitis. Electronically Signed   By: Amie Portlandavid  Ormond M.D.   On: 09/16/2017 18:27     ASSESSMENT AND PLAN:   31 yo female w/ no significant PMH who came to the hospital due to lower extremity pain and noted to have abnormal LFTs with jaundice.   1. Abnormal LFTs with jaundice-suspected to be secondary to a viral prodrome. Patient recently traveled to [place] and after coming back she had family members at home that were sick with a viral illness. She also had recent dental work done and took increasing amounts of Tylenol and Advil for about a week. -She developed some low-grade fever with body aches and light-colored loose stools. -LFTs since yesterday have remained stable. Serologic workup for hepatitis and autoimmune diseases has been initiated but is currently pending. -Patient underwent MRCP which showed no common bile duct stone or any stricture or any obstruction. Patient is denies any abdominal pain, vomiting presently. -Continue supportive care, will follow LFTs and bilirubin in a.m. Appreciate gastroenterology input.     All the records are reviewed and case discussed with Care Management/Social Worker. Management plans discussed with the patient, family and they are in agreement.  CODE STATUS: Full code  DVT Prophylaxis: Ambulatory  TOTAL TIME TAKING CARE OF THIS PATIENT: 30 minutes.   POSSIBLE D/C IN 1-2 DAYS, DEPENDING ON CLINICAL CONDITION.   [name],VIVEK J M.D on 09/17/2017 at 3:00 PM  Between 7am to 6pm - Pager  - [phone]  After 6pm go to [email or web address] - Scientist, research (life sciences)password [email or web address]  [facility]  Office  [phone]  CC: Primary care physician; Ellyn HackShah, Syed Asad A, MD

## 2017-09-18 ENCOUNTER — Encounter: Payer: Self-pay | Admitting: Gastroenterology

## 2017-09-18 DIAGNOSIS — B159 Hepatitis A without hepatic coma: Principal | ICD-10-CM

## 2017-09-18 LAB — COMPREHENSIVE METABOLIC PANEL
ALT: 843 U/L — ABNORMAL HIGH (ref 14–54)
AST: 203 U/L — AB (ref 15–41)
Albumin: 3.2 g/dL — ABNORMAL LOW (ref 3.5–5.0)
Alkaline Phosphatase: 92 U/L (ref 38–126)
Anion gap: 8 (ref 5–15)
BUN: <5 mg/dL — ABNORMAL LOW (ref 6–20)
CHLORIDE: 106 mmol/L (ref 101–111)
CO2: 24 mmol/L (ref 22–32)
Calcium: 8.5 mg/dL — ABNORMAL LOW (ref 8.9–10.3)
Creatinine, Ser: 0.48 mg/dL (ref 0.44–1.00)
Glucose, Bld: 79 mg/dL (ref 65–99)
POTASSIUM: 3.8 mmol/L (ref 3.5–5.1)
SODIUM: 138 mmol/L (ref 135–145)
Total Bilirubin: 6.3 mg/dL — ABNORMAL HIGH (ref 0.3–1.2)
Total Protein: 6.6 g/dL (ref 6.5–8.1)

## 2017-09-18 LAB — ANTI-MICROSOMAL ANTIBODY LIVER / KIDNEY: LKM1 Ab: 0.9 Units (ref 0.0–20.0)

## 2017-09-18 LAB — PROTEIN ELECTROPHORESIS, SERUM
A/G RATIO SPE: 1 (ref 0.7–1.7)
Albumin ELP: 3.3 g/dL (ref 2.9–4.4)
Alpha-1-Globulin: 0.3 g/dL (ref 0.0–0.4)
Alpha-2-Globulin: 0.6 g/dL (ref 0.4–1.0)
Beta Globulin: 1.1 g/dL (ref 0.7–1.3)
Gamma Globulin: 1.5 g/dL (ref 0.4–1.8)
Globulin, Total: 3.4 g/dL (ref 2.2–3.9)
TOTAL PROTEIN ELP: 6.7 g/dL (ref 6.0–8.5)

## 2017-09-18 LAB — ANA COMPREHENSIVE PANEL
Anti JO-1: <0.2 AI (ref 0.0–0.9)
Centromere Ab Screen: <0.2 AI (ref 0.0–0.9)
ds DNA Ab: <1 IU/mL (ref 0–9)

## 2017-09-18 LAB — HEPATITIS PANEL, ACUTE
HEP A IGM: POSITIVE — AB
Hep B C IgM: NEGATIVE
Hepatitis B Surface Ag: NEGATIVE

## 2017-09-18 LAB — ANTI-SMOOTH MUSCLE ANTIBODY, IGG: F-Actin IgG: 19 Units (ref 0–19)

## 2017-09-18 LAB — CMV IGM

## 2017-09-18 LAB — EPSTEIN-BARR VIRUS VCA, IGG: EBV VCA IGG: 268 U/mL — AB (ref 0.0–17.9)

## 2017-09-18 LAB — HIV ANTIBODY (ROUTINE TESTING W REFLEX): HIV SCREEN 4TH GENERATION: NONREACTIVE

## 2017-09-18 LAB — MITOCHONDRIAL ANTIBODIES: MITOCHONDRIAL M2 AB, IGG: 16.8 U (ref 0.0–20.0)

## 2017-09-18 LAB — CERULOPLASMIN: Ceruloplasmin: 26.8 mg/dL (ref 19.0–39.0)

## 2017-09-18 LAB — EPSTEIN-BARR VIRUS VCA, IGM

## 2017-09-18 MED ORDER — ONDANSETRON HCL 4 MG PO TABS: 4.0000 mg | ORAL_TABLET | Freq: Four times a day (QID) | ORAL | 0 refills | Status: DC | PRN

## 2017-09-18 NOTE — Discharge Summary (Signed)
[facility] [place] [facility]   PATIENT NAME: Christy Farmer    MR#:  [id number]  DATE OF BIRTH:  [birth year]  DATE OF ADMISSION:  09/16/2017 ADMITTING PHYSICIAN: Auburn BilberryShreyang Patel, MD  DATE OF DISCHARGE: 09/18/2017  PRIMARY CARE PHYSICIAN: Ellyn HackShah, Syed Asad A, MD    ADMISSION DIAGNOSIS:  Pain [R52] Transaminitis [R74.0]  DISCHARGE DIAGNOSIS:  Active Problems:   Jaundice   SECONDARY DIAGNOSIS:   Past Medical History:  Diagnosis Date  . Abnormal uterine bleeding (AUB)   . Allergy    seasonal allergies.  . Asthma   . Constipation   . Enlarged uterus   . Heavy periods   . Vaginal Pap smear, abnormal     HOSPITAL COURSE:   31 yo female w/ no significant PMH who came to the hospital due to lower extremity pain and noted to have abnormal LFTs with jaundice.   1. Abnormal LFTs with jaundice-suspected to be secondary to a viral prodrome. Patient recently traveled to [place] and after coming back she had family members at home that were sick with a viral illness. She also had recent dental work done and took increasing amounts of Tylenol and Advil for about a week. -She developed some low-grade fever with body aches and light-colored loose stools. -Patient's LFTs have significantly improved, high bilirubins are trending down. Her jaundice is improving. This was likely secondary to hepatitis A. Patient's hepatitis IgM was positive. -Patient will be discharged home today with follow-up with gastroenterology as an outpatient.  -Patient underwent MRCP which showed no common bile duct stone or any stricture or any obstruction.  Pt. Will follow with GI (Dr. Allegra LaiVanga) as outpatient.    DISCHARGE CONDITIONS:   Stable.   CONSULTS OBTAINED:  Treatment Team:  Midge MiniumWohl, Darren, MD Toney ReilVanga, Rohini Reddy, MD  DRUG ALLERGIES:  No Known Allergies  DISCHARGE MEDICATIONS:   Allergies as of 09/18/2017   No Known Allergies     Medication List    TAKE these  medications   ibuprofen 200 MG tablet Commonly known as:  ADVIL,MOTRIN Take 200 mg by mouth every 6 (six) hours as needed.   levonorgestrel 20 MCG/24HR IUD Commonly known as:  MIRENA 1 each by Intrauterine route once.   ondansetron 4 MG tablet Commonly known as:  ZOFRAN Take 1 tablet (4 mg total) by mouth every 6 (six) hours as needed for nausea.   Vitamin D (Ergocalciferol) 50000 units Caps capsule Commonly known as:  DRISDOL Take 1 capsule (50,000 Units total) by mouth once a week. For 12 weeks         DISCHARGE INSTRUCTIONS:   DIET:  Regular diet  DISCHARGE CONDITION:  Stable  ACTIVITY:  Activity as tolerated  OXYGEN:  Home Oxygen: No.   Oxygen Delivery: room air  DISCHARGE LOCATION:  home   If you experience worsening of your admission symptoms, develop shortness of breath, life threatening emergency, suicidal or homicidal thoughts you must seek medical attention immediately by calling 911 or calling your MD immediately  if symptoms less severe.  You Must read complete instructions/literature along with all the possible adverse reactions/side effects for all the Medicines you take and that have been prescribed to you. Take any new Medicines after you have completely understood and accpet all the possible adverse reactions/side effects.   Please note  You were cared for by a hospitalist during your hospital stay. If you have any questions about your discharge medications or the care you received while you were  in the hospital after you are discharged, you can call the unit and asked to speak with the hospitalist on call if the hospitalist that took care of you is not available. Once you are discharged, your primary care physician will handle any further medical issues. Please note that NO REFILLS for any discharge medications will be authorized once you are discharged, as it is imperative that you return to your primary care physician (or establish a relationship  with a primary care physician if you do not have one) for your aftercare needs so that they can reassess your need for medications and monitor your lab values.     Today   Jaundice improving.  Still has some nausea.  LFT's much improved.   VITAL SIGNS:  Blood pressure 110/66, pulse 67, temperature 98.1 F (36.7 C), temperature source Oral, resp. rate 18, height 5\' 4"  (1.626 m), weight 70.8 kg (156 lb 1.6 oz), SpO2 99 %.  I/O:    Intake/Output Summary (Last 24 hours) at 09/18/2017 1404 Last data filed at 09/18/2017 0939 Gross per 24 hour  Intake 2142 ml  Output 3750 ml  Net -1608 ml    PHYSICAL EXAMINATION:  GENERAL:  31 y.o.-year-old patient lying in the bed with no acute distress.  EYES: Pupils equal, round, reactive to light and accommodation. + scleral icterus. Extraocular muscles intact.  HEENT: Head atraumatic, normocephalic. Oropharynx and nasopharynx clear.  NECK:  Supple, no jugular venous distention. No thyroid enlargement, no tenderness.  LUNGS: Normal breath sounds bilaterally, no wheezing, rales,rhonchi. No use of accessory muscles of respiration.  CARDIOVASCULAR: S1, S2 normal. No murmurs, rubs, or gallops.  ABDOMEN: Soft, non-tender, non-distended. Bowel sounds present. No organomegaly or mass.  EXTREMITIES: No pedal edema, cyanosis, or clubbing.  NEUROLOGIC: Cranial nerves II through XII are intact. No focal motor or sensory defecits b/l.  PSYCHIATRIC: The patient is alert and oriented x 3.  SKIN: No obvious rash, lesion, or ulcer.   DATA REVIEW:   CBC Recent Labs  Lab 09/17/17 0445  WBC 7.2  HGB 14.1  HCT 42.0  PLT 291    Chemistries  Recent Labs  Lab 09/18/17 0453  NA 138  K 3.8  CL 106  CO2 24  GLUCOSE 79  BUN <5*  CREATININE 0.48  CALCIUM 8.5*  AST 203*  ALT 843*  ALKPHOS 92  BILITOT 6.3*    Cardiac Enzymes No results for input(s): TROPONINI in the last 168 hours.  Microbiology Results  Results for orders placed or performed in  visit on 08/18/15  Anaerobic and Aerobic Culture     Status: Abnormal   Collection Time: 08/18/15 12:00 AM  Result Value Ref Range Status   Anaerobic Culture Final report (A)  Final   Result 1 Prevotella bivia (A)  Final    Comment: Scant growth Studies at [facility] have confirmed the observations of others who have demonstrated that Prevotella, Porphyromonas and Bacteroides species other than Bacteroides fragilis group are routinely susceptible to Cefoxitin, Chloramphenicol, and Metronidazole and are usually resistant to Penicillin.     RADIOLOGY:  Mr 3d Recon At Scanner  Result Date: 09/17/2017 CLINICAL DATA:  Abnormal liver function tests.  Jaundice. EXAM: MRI ABDOMEN WITHOUT AND WITH CONTRAST (INCLUDING MRCP) TECHNIQUE: Multiplanar multisequence MR imaging of the abdomen was performed both before and after the administration of intravenous contrast. Heavily T2-weighted images of the biliary and pancreatic ducts were obtained, and three-dimensional MRCP images were rendered by post processing. CONTRAST:  14mL MULTIHANCE GADOBENATE DIMEGLUMINE  529 MG/ML IV SOLN COMPARISON:  Ultrasound dated 09/16/2017 FINDINGS: Lower chest: Unremarkable Hepatobiliary: No findings of hepatic steatosis. Thick-walled small caliber gallbladder. The common bile duct measures up to 6 mm in diameter, mildly dilated for age, and demonstrates normal conical tapering course distally without observed filling defect. No significant intrahepatic biliary dilatation. No focal abnormality in the liver or specific abnormal hepatic enhancement is identified. No morphologic features of cirrhosis. Pancreas:  Unremarkable Spleen:  Unremarkable Adrenals/Urinary Tract:  Unremarkable Stomach/Bowel: Unremarkable Vascular/Lymphatic:  Unremarkable Other:  No supplemental non-categorized findings. Musculoskeletal: Unremarkable IMPRESSION: 1. The gallbladder is thick-walled and small in caliber, potentially from inflammation or  contraction. Similar appearance to the prior ultrasound. 2. Mildly dilated common bile duct at 6 mm diameter, but without obvious filling defect to suggest a cause for obstruction. The distal CBD appears to taper in a conical fashion. Possibilities for the patient's elevated AST and ALT along with this appearance may include hepatocellular dysfunction with incidental mild biliary dilatation, or some type of mild ampullary stricture. I do not see significant abnormal enhancement in the ampulla and the dorsal pancreatic duct is of normal caliber. If further imaging workup is warranted, nuclear medicine hepatobiliary scan with gallbladder ejection fraction assessment could be utilized to assess for hepatocellular function, patency of the CBD, as well as gallbladder function. Electronically Signed   By: Gaylyn Rong M.D.   On: 09/17/2017 10:34   Mr Abdomen Mrcp Vivien Rossetti Contast  Result Date: 09/17/2017 CLINICAL DATA:  Abnormal liver function tests.  Jaundice. EXAM: MRI ABDOMEN WITHOUT AND WITH CONTRAST (INCLUDING MRCP) TECHNIQUE: Multiplanar multisequence MR imaging of the abdomen was performed both before and after the administration of intravenous contrast. Heavily T2-weighted images of the biliary and pancreatic ducts were obtained, and three-dimensional MRCP images were rendered by post processing. CONTRAST:  14mL MULTIHANCE GADOBENATE DIMEGLUMINE 529 MG/ML IV SOLN COMPARISON:  Ultrasound dated 09/16/2017 FINDINGS: Lower chest: Unremarkable Hepatobiliary: No findings of hepatic steatosis. Thick-walled small caliber gallbladder. The common bile duct measures up to 6 mm in diameter, mildly dilated for age, and demonstrates normal conical tapering course distally without observed filling defect. No significant intrahepatic biliary dilatation. No focal abnormality in the liver or specific abnormal hepatic enhancement is identified. No morphologic features of cirrhosis. Pancreas:  Unremarkable Spleen:  Unremarkable  Adrenals/Urinary Tract:  Unremarkable Stomach/Bowel: Unremarkable Vascular/Lymphatic:  Unremarkable Other:  No supplemental non-categorized findings. Musculoskeletal: Unremarkable IMPRESSION: 1. The gallbladder is thick-walled and small in caliber, potentially from inflammation or contraction. Similar appearance to the prior ultrasound. 2. Mildly dilated common bile duct at 6 mm diameter, but without obvious filling defect to suggest a cause for obstruction. The distal CBD appears to taper in a conical fashion. Possibilities for the patient's elevated AST and ALT along with this appearance may include hepatocellular dysfunction with incidental mild biliary dilatation, or some type of mild ampullary stricture. I do not see significant abnormal enhancement in the ampulla and the dorsal pancreatic duct is of normal caliber. If further imaging workup is warranted, nuclear medicine hepatobiliary scan with gallbladder ejection fraction assessment could be utilized to assess for hepatocellular function, patency of the CBD, as well as gallbladder function. Electronically Signed   By: Gaylyn Rong M.D.   On: 09/17/2017 10:34   US Abdomen Limited Ruq  Result Date: 09/16/2017 CLINICAL DATA:  Abdominal pain for 1 and 1/2 weeks with nausea and vomiting. EXAM: ULTRASOUND ABDOMEN LIMITED RIGHT UPPER QUADRANT COMPARISON:  None. FINDINGS: Gallbladder: Gallbladder is mostly contracted. No gallstones. No  significant wall thickening. No pericholecystic fluid. Common bile duct: Diameter: 7 mm. No duct stone visualized. Distal aspect of the duct was obscured by bowel gas. Liver: No focal lesion identified. Within normal limits in parenchymal echogenicity. Portal vein is patent on color Doppler imaging with normal direction of blood flow towards the liver. IMPRESSION: 1. Dilated common bile duct. No duct stone visualized. If there are symptoms of biliary obstruction, follow-up ERCP or MRCP would be recommended. 2. No other  abnormalities.  No evidence of acute cholecystitis. Electronically Signed   By: Amie Portlandavid  Ormond M.D.   On: 09/16/2017 18:27      Management plans discussed with the patient, family and they are in agreement.  CODE STATUS:     Code Status Orders  (From admission, onward)        Start     Ordered   09/16/17 2056  Full code  Continuous     09/16/17 2055    Code Status History    Date Active Date Inactive Code Status Order ID Comments User Context   This patient has a current code status but no historical code status.      TOTAL TIME TAKING CARE OF THIS PATIENT: 40 minutes.    [name],VIVEK J M.D on 09/18/2017 at 2:04 PM  Between 7am to 6pm - Pager - [phone]  After 6pm go to [email or web address] - Scientist, research (life sciences)password EPAS [facility]  [facility] [place] [facility]  Office  [phone]  CC: Primary care physician; Ellyn HackShah, Syed Asad A, MD

## 2017-09-18 NOTE — Progress Notes (Signed)
Arlyss Repressohini R Vanga, MD [address]  [address]  [place], [place] [address]  Main: [phone]  Fax: [phone] Pager: [phone]   Subjective: Feels nauseous, otherwise no complaints  Objective: Vital signs in last 24 hours: Vitals:   09/17/17 0505 09/17/17 1333 09/17/17 2038 09/18/17 0503  BP: (!) 121/93 123/70 120/80 110/66  Pulse: 79 80 81 67  Resp: 16 16 18    Temp: 97.8 F (36.6 C) 97.8 F (36.6 C) 98.1 F (36.7 C) 98.1 F (36.7 C)  TempSrc: Oral Oral Oral Oral  SpO2: 100% 96% 95% 99%  Weight:      Height:       Weight change:   Intake/Output Summary (Last 24 hours) at 09/18/2017 0847 Last data filed at 09/18/2017 0505 Gross per 24 hour  Intake 2531 ml  Output 4200 ml  Net -1669 ml     Exam: Heart:: Regular rate and rhythm or S1S2 present Lungs: clear to auscultation Abdomen: soft, nontender, normal bowel sounds   Lab Results: @LABTEST2 @ Micro Results: No results found for this or any previous visit (from the past 240 hour(s)). Studies/Results: Mr 3d Recon At Scanner  Result Date: 09/17/2017 CLINICAL DATA:  Abnormal liver function tests.  Jaundice. EXAM: MRI ABDOMEN WITHOUT AND WITH CONTRAST (INCLUDING MRCP) TECHNIQUE: Multiplanar multisequence MR imaging of the abdomen was performed both before and after the administration of intravenous contrast. Heavily T2-weighted images of the biliary and pancreatic ducts were obtained, and three-dimensional MRCP images were rendered by post processing. CONTRAST:  14mL MULTIHANCE GADOBENATE DIMEGLUMINE 529 MG/ML IV SOLN COMPARISON:  Ultrasound dated 09/16/2017 FINDINGS: Lower chest: Unremarkable Hepatobiliary: No findings of hepatic steatosis. Thick-walled small caliber gallbladder. The common bile duct measures up to 6 mm in diameter, mildly dilated for age, and demonstrates normal conical tapering course distally without observed filling defect. No significant intrahepatic biliary dilatation. No focal  abnormality in the liver or specific abnormal hepatic enhancement is identified. No morphologic features of cirrhosis. Pancreas:  Unremarkable Spleen:  Unremarkable Adrenals/Urinary Tract:  Unremarkable Stomach/Bowel: Unremarkable Vascular/Lymphatic:  Unremarkable Other:  No supplemental non-categorized findings. Musculoskeletal: Unremarkable IMPRESSION: 1. The gallbladder is thick-walled and small in caliber, potentially from inflammation or contraction. Similar appearance to the prior ultrasound. 2. Mildly dilated common bile duct at 6 mm diameter, but without obvious filling defect to suggest a cause for obstruction. The distal CBD appears to taper in a conical fashion. Possibilities for the patient's elevated AST and ALT along with this appearance may include hepatocellular dysfunction with incidental mild biliary dilatation, or some type of mild ampullary stricture. I do not see significant abnormal enhancement in the ampulla and the dorsal pancreatic duct is of normal caliber. If further imaging workup is warranted, nuclear medicine hepatobiliary scan with gallbladder ejection fraction assessment could be utilized to assess for hepatocellular function, patency of the CBD, as well as gallbladder function. Electronically Signed   By: Gaylyn RongWalter  Liebkemann M.D.   On: 09/17/2017 10:34   Mr Abdomen Mrcp Vivien RossettiW Wo Contast  Result Date: 09/17/2017 CLINICAL DATA:  Abnormal liver function tests.  Jaundice. EXAM: MRI ABDOMEN WITHOUT AND WITH CONTRAST (INCLUDING MRCP) TECHNIQUE: Multiplanar multisequence MR imaging of the abdomen was performed both before and after the administration of intravenous contrast. Heavily T2-weighted images of the biliary and pancreatic ducts were obtained, and three-dimensional MRCP images were rendered by post processing. CONTRAST:  14mL MULTIHANCE GADOBENATE DIMEGLUMINE 529 MG/ML IV SOLN COMPARISON:  Ultrasound dated 09/16/2017 FINDINGS: Lower chest: Unremarkable Hepatobiliary: No findings of  hepatic steatosis. Thick-walled small caliber gallbladder. The common bile duct measures up to 6 mm in diameter, mildly dilated for age, and demonstrates normal conical tapering course distally without observed filling defect. No significant intrahepatic biliary dilatation. No focal abnormality in the liver or specific abnormal hepatic enhancement is identified. No morphologic features of cirrhosis. Pancreas:  Unremarkable Spleen:  Unremarkable Adrenals/Urinary Tract:  Unremarkable Stomach/Bowel: Unremarkable Vascular/Lymphatic:  Unremarkable Other:  No supplemental non-categorized findings. Musculoskeletal: Unremarkable IMPRESSION: 1. The gallbladder is thick-walled and small in caliber, potentially from inflammation or contraction. Similar appearance to the prior ultrasound. 2. Mildly dilated common bile duct at 6 mm diameter, but without obvious filling defect to suggest a cause for obstruction. The distal CBD appears to taper in a conical fashion. Possibilities for the patient's elevated AST and ALT along with this appearance may include hepatocellular dysfunction with incidental mild biliary dilatation, or some type of mild ampullary stricture. I do not see significant abnormal enhancement in the ampulla and the dorsal pancreatic duct is of normal caliber. If further imaging workup is warranted, nuclear medicine hepatobiliary scan with gallbladder ejection fraction assessment could be utilized to assess for hepatocellular function, patency of the CBD, as well as gallbladder function. Electronically Signed   By: Gaylyn RongWalter  Liebkemann M.D.   On: 09/17/2017 10:34   [place] Abdomen Limited Ruq  Result Date: 09/16/2017 CLINICAL DATA:  Abdominal pain for 1 and 1/2 weeks with nausea and vomiting. EXAM: ULTRASOUND ABDOMEN LIMITED RIGHT UPPER QUADRANT COMPARISON:  None. FINDINGS: Gallbladder: Gallbladder is mostly contracted. No gallstones. No significant wall thickening. No pericholecystic fluid. Common bile duct:  Diameter: 7 mm. No duct stone visualized. Distal aspect of the duct was obscured by bowel gas. Liver: No focal lesion identified. Within normal limits in parenchymal echogenicity. Portal vein is patent on color Doppler imaging with normal direction of blood flow towards the liver. IMPRESSION: 1. Dilated common bile duct. No duct stone visualized. If there are symptoms of biliary obstruction, follow-up ERCP or MRCP would be recommended. 2. No other abnormalities.  No evidence of acute cholecystitis. Electronically Signed   By: Amie Portlandavid  Ormond M.D.   On: 09/16/2017 18:27   Medications: I have reviewed the patient's current medications. Scheduled Meds: . heparin  5,000 Units Subcutaneous Q8H  . pneumococcal 23 valent vaccine  0.5 mL Intramuscular Tomorrow-1000   Continuous Infusions: . sodium chloride 100 mL/hr at 09/18/17 0505   PRN Meds:.ibuprofen, ondansetron **OR** ondansetron (ZOFRAN) IV, oxyCODONE, promethazine   Assessment: Active Problems:   Jaundice Ferol Leonides SakeFletes Castro is a 31 y.o. female with no PMH recent trip to [place], admitted with acute hepatitis, predominantly hepatocellular pattern and probably viral etiology given her prodrome of symptoms and component of NSAID and tylenol use. Ferritin, TSH normal. MRCP unremarkable for bile duct obstruction No signs or symptoms of acute liver failure  LFTs are improving HepA IgM positive  Plan: Acute viral hepatitis from Hep A Improving Discharge home today Follow up with GI in 6weeks Recheck LFTs in 4-6weeks   LOS: 2 days   Rohini Vanga 09/18/2017, 8:47 AM

## 2017-09-18 NOTE — Progress Notes (Signed)
[facility]    Christy Farmer was admitted to the Hospital on 09/16/2017 and Discharged  09/18/2017 and should be excused from work/school   for 3 days starting 09/16/2017 , may return to work/school without any restrictions.  Call Christy LiasVivek Marijayne Rauth MD, [facility]  [phone] with questions.  [name],[name] J M.D on 09/18/2017,at 11:12 AM

## 2017-09-18 NOTE — Progress Notes (Signed)
Patient feeling much better. Still experiencing leg pain but not as much. Patient cleared for discharge by both Dr Cherlynn KaiserSainani and Dr Allegra LaiVanga. Follow ups scheduled. AVS printed. Discharge instructions explained and all questions answered. IV removed. Patient walked to exit with husband.

## 2017-09-19 LAB — IMMUNOGLOBULINS A/E/G/M, SERUM
IGA: 268 mg/dL (ref 87–352)
IGG (IMMUNOGLOBIN G), SERUM: 1085 mg/dL (ref 700–1600)
IgE (Immunoglobulin E), Serum: 14 IU/mL (ref 0–100)
IgM (Immunoglobulin M), Srm: 433 mg/dL — ABNORMAL HIGH (ref 26–217)

## 2017-09-19 LAB — RSV(RESPIRATORY SYNCYTIAL VIRUS) AB, BLOOD: RSV Ab: 1:16 {titer} — ABNORMAL HIGH

## 2017-09-21 ENCOUNTER — Ambulatory Visit: Payer: Managed Care, Other (non HMO) | Admitting: Family Medicine

## 2017-09-25 ENCOUNTER — Encounter: Payer: Self-pay | Admitting: Family Medicine

## 2017-09-25 ENCOUNTER — Ambulatory Visit
Admission: RE | Admit: 2017-09-25 | Discharge: 2017-09-25 | Disposition: A | Discharge status: Home / Self Care | Payer: BLUE CROSS/BLUE SHIELD | Source: Ambulatory Visit | Attending: Family Medicine | Admitting: Family Medicine

## 2017-09-25 ENCOUNTER — Other Ambulatory Visit: Payer: Self-pay | Admitting: Family Medicine

## 2017-09-25 ENCOUNTER — Inpatient Hospital Stay: Payer: Managed Care, Other (non HMO) | Admitting: Family Medicine

## 2017-09-25 ENCOUNTER — Ambulatory Visit (INDEPENDENT_AMBULATORY_CARE_PROVIDER_SITE_OTHER): Payer: BLUE CROSS/BLUE SHIELD | Admitting: Family Medicine

## 2017-09-25 VITALS — BP 124/78 | HR 90 | Temp 98.5°F | Resp 14 | Wt 152.0 lb

## 2017-09-25 DIAGNOSIS — M25552 Pain in left hip: Secondary | ICD-10-CM | POA: Insufficient documentation

## 2017-09-25 DIAGNOSIS — M79605 Pain in left leg: Secondary | ICD-10-CM

## 2017-09-25 DIAGNOSIS — B159 Hepatitis A without hepatic coma: Secondary | ICD-10-CM | POA: Diagnosis not present

## 2017-09-25 NOTE — Progress Notes (Signed)
Name: Christy Farmer   MRN: [id number]    DOB: Dec 15, 1985   Date:09/25/2017       Progress Note  Subjective  Chief Complaint  Chief Complaint  Patient presents with  . Hospitalization Follow-up    Hep A; patient would like to have another test to make sure its down. She is still urination yellow and yellow     HPI  Pt. is here for follow up of confirmed Hepatitis A infection, likely contracted while on wedding/vacation in [place], she started having symptoms when she arrived back in the [place]. She had nausea, yellowing of the skin, lack of appetite, weight loss, etc along with left leg pain. On admission, she was found to have elevated liver enzymes in the thousands, Hep A IgM was positive, bilirubin was elevated, she was diagnosed with acute Hep A infection.  Currently on supportive treatment, has been scheduled for appointment with Gastroenterology, no other complaints. She also has left side low back and left hip pain, radiating down the left leg, started 5 days ago, recently discharged from the hospital for Hepatitis A infection and has been sedentary most of the time. She has no history of injury to the lower back but did travel overseas to attend the wedding.     Past Medical History:  Diagnosis Date  . Abnormal uterine bleeding (AUB)   . Allergy    seasonal allergies.  . Asthma   . Constipation   . Enlarged uterus   . Heavy periods   . Hepatitis A 09/16/2017  . Vaginal Pap smear, abnormal     Past Surgical History:  Procedure Laterality Date  . COLPOSCOPY      Family History  Problem Relation Age of Onset  . Hyperlipidemia Mother   . Hypertension Mother   . Healthy Father   . Healthy Brother   . Cancer Neg Hx   . Diabetes Neg Hx   . Heart disease Neg Hx     Social History   Socioeconomic History  . Marital status: Single    Spouse name: Not on file  . Number of children: Not on file  . Years of education: Not on file  . Highest education level: Not on  file  Social Needs  . Financial resource strain: Not on file  . Food insecurity - worry: Not on file  . Food insecurity - inability: Not on file  . Transportation needs - medical: Not on file  . Transportation needs - non-medical: Not on file  Occupational History  . Not on file  Tobacco Use  . Smoking status: Never Smoker  . Smokeless tobacco: Never Used  Substance and Sexual Activity  . Alcohol use: Yes    Alcohol/week: 1.2 oz    Types: 2 Glasses of wine per week  . Drug use: No  . Sexual activity: Yes    Birth control/protection: IUD  Other Topics Concern  . Not on file  Social History Narrative  . Not on file     Current Outpatient Medications:  .  ibuprofen (ADVIL,MOTRIN) 200 MG tablet, Take 200 mg by mouth every 6 (six) hours as needed., Disp: , Rfl:  .  levonorgestrel (MIRENA) 20 MCG/24HR IUD, 1 each by Intrauterine route once., Disp: , Rfl:  .  ondansetron (ZOFRAN) 4 MG tablet, Take 1 tablet (4 mg total) by mouth every 6 (six) hours as needed for nausea. (Patient not taking: Reported on 09/25/2017), Disp: 20 tablet, Rfl: 0  No Known Allergies  ROS  Please see HPI for complete discussion of ROS.  Objective  Vitals:   09/25/17 1402  BP: 124/78  Pulse: 90  Resp: 14  Temp: 98.5 F (36.9 C)  TempSrc: Oral  SpO2: 98%  Weight: 152 lb (68.9 kg)    Physical Exam  Constitutional: She is oriented to person, place, and time and well-developed, well-nourished, and in no distress.  Cardiovascular: Normal rate, regular rhythm and normal heart sounds.  No murmur heard. Pulmonary/Chest: Effort normal and breath sounds normal. She has no wheezes.  Abdominal: Soft. Bowel sounds are normal. There is no tenderness.  Musculoskeletal:       Left upper leg: She exhibits tenderness. She exhibits no swelling and no edema.       Legs: Neurological: She is alert and oriented to person, place, and time.  Skin:  Generalized mild yellowish of the skin c/w jaundice.     Psychiatric: Mood, memory, affect and judgment normal.  Nursing note and vitals reviewed.    Recent Results (from the past 2160 hour(s))  Lipase, blood     Status: Abnormal   Collection Time: 09/16/17  4:52 PM  Result Value Ref Range   Lipase 52 (H) 11 - 51 U/L  Comprehensive metabolic panel     Status: Abnormal   Collection Time: 09/16/17  4:52 PM  Result Value Ref Range   Sodium 137 135 - 145 mmol/L   Potassium 3.3 (L) 3.5 - 5.1 mmol/L   Chloride 100 (L) 101 - 111 mmol/L   CO2 25 22 - 32 mmol/L   Glucose, Bld 86 65 - 99 mg/dL   BUN 6 6 - 20 mg/dL   Creatinine, Ser 0.48 0.44 - 1.00 mg/dL   Calcium 8.8 (L) 8.9 - 10.3 mg/dL   Total Protein 7.7 6.5 - 8.1 g/dL   Albumin 3.8 3.5 - 5.0 g/dL   AST 374 (H) 15 - 41 U/L   ALT 1,380 (H) 14 - 54 U/L   Alkaline Phosphatase 117 38 - 126 U/L   Total Bilirubin 7.1 (H) 0.3 - 1.2 mg/dL   GFR calc non Af Amer >60 >60 mL/min   GFR calc Af Amer >60 >60 mL/min    Comment: (NOTE) The eGFR has been calculated using the CKD EPI equation. This calculation has not been validated in all clinical situations. eGFR's persistently <60 mL/min signify possible Chronic Kidney Disease.    Anion gap 12 5 - 15  CBC     Status: Abnormal   Collection Time: 09/16/17  4:52 PM  Result Value Ref Range   WBC 8.0 3.6 - 11.0 K/uL   RBC 4.80 3.80 - 5.20 MIL/uL   Hemoglobin 13.8 12.0 - 16.0 g/dL   HCT 40.9 35.0 - 47.0 %   MCV 85.3 80.0 - 100.0 fL   MCH 28.9 26.0 - 34.0 pg   MCHC 33.8 32.0 - 36.0 g/dL   RDW 14.6 (H) 11.5 - 14.5 %   Platelets 292 150 - 440 K/uL  Urinalysis, Complete w Microscopic     Status: Abnormal   Collection Time: 09/16/17  4:52 PM  Result Value Ref Range   Color, Urine AMBER (A) YELLOW    Comment: BIOCHEMICALS MAY BE AFFECTED BY COLOR   APPearance CLEAR (A) CLEAR   Specific Gravity, Urine 1.005 1.005 - 1.030   pH 8.0 5.0 - 8.0   Glucose, UA NEGATIVE NEGATIVE mg/dL   Hgb urine dipstick NEGATIVE NEGATIVE   Bilirubin Urine NEGATIVE  NEGATIVE   Ketones,  ur NEGATIVE NEGATIVE mg/dL   Protein, ur NEGATIVE NEGATIVE mg/dL   Nitrite NEGATIVE NEGATIVE   Leukocytes, UA NEGATIVE NEGATIVE   RBC / HPF NONE SEEN 0 - 5 RBC/hpf   WBC, UA 0-5 0 - 5 WBC/hpf   Bacteria, UA NONE SEEN NONE SEEN   Squamous Epithelial / LPF 0-5 (A) NONE SEEN  Bilirubin, direct     Status: Abnormal   Collection Time: 09/16/17  4:52 PM  Result Value Ref Range   Bilirubin, Direct 4.5 (H) 0.1 - 0.5 mg/dL  hCG, quantitative, pregnancy     Status: None   Collection Time: 09/16/17  4:52 PM  Result Value Ref Range   hCG, Beta Chain, Quant, S 1 <5 mIU/mL    Comment:          GEST. AGE      CONC.  (mIU/mL)   <=1 WEEK        5 - 50     2 WEEKS       50 - 500     3 WEEKS       100 - 10,000     4 WEEKS     1,000 - 30,000     5 WEEKS     3,500 - 115,000   6-8 WEEKS     12,000 - 270,000    12 WEEKS     15,000 - 220,000        FEMALE AND NON-PREGNANT FEMALE:     LESS THAN 5 mIU/mL   Ferritin     Status: None   Collection Time: 09/16/17  4:52 PM  Result Value Ref Range   Ferritin 216 11 - 307 ng/mL  Iron     Status: None   Collection Time: 09/16/17  4:52 PM  Result Value Ref Range   Iron 88 28 - 170 ug/dL  Pregnancy, urine POC     Status: None   Collection Time: 09/16/17  5:10 PM  Result Value Ref Range   Preg Test, Ur NEGATIVE NEGATIVE    Comment:        THE SENSITIVITY OF THIS METHODOLOGY IS >24 mIU/mL   Hepatitis panel, acute     Status: Abnormal   Collection Time: 09/16/17  7:17 PM  Result Value Ref Range   Hepatitis B Surface Ag Negative Negative   HCV Ab <0.1 0.0 - 0.9 s/co ratio    Comment: (NOTE)                                  Negative:     < 0.8                             Indeterminate: 0.8 - 0.9                                  Positive:     > 0.9 The CDC recommends that a positive HCV antibody result be followed up with a HCV Nucleic Acid Amplification test (998338). Performed At: [facility] [place],  [place] [id number] Rush Farmer MD [id number]    Hep A IgM Positive (A) Negative   Hep B C IgM Negative Negative  Acetaminophen level     Status: Abnormal   Collection Time: 09/16/17  7:17 PM  Result Value  Ref Range   Acetaminophen (Tylenol), Serum <10 (L) 10 - 30 ug/mL    Comment:        THERAPEUTIC CONCENTRATIONS VARY SIGNIFICANTLY. A RANGE OF 10-30 ug/mL MAY BE AN EFFECTIVE CONCENTRATION FOR MANY PATIENTS. HOWEVER, SOME ARE BEST TREATED AT CONCENTRATIONS OUTSIDE THIS RANGE. ACETAMINOPHEN CONCENTRATIONS >150 ug/mL AT 4 HOURS AFTER INGESTION AND >50 ug/mL AT 12 HOURS AFTER INGESTION ARE OFTEN ASSOCIATED WITH TOXIC REACTIONS.   Comprehensive metabolic panel     Status: Abnormal   Collection Time: 09/17/17  4:45 AM  Result Value Ref Range   Sodium 137 135 - 145 mmol/L   Potassium 4.1 3.5 - 5.1 mmol/L   Chloride 106 101 - 111 mmol/L   CO2 23 22 - 32 mmol/L   Glucose, Bld 103 (H) 65 - 99 mg/dL   BUN <5 (L) 6 - 20 mg/dL   Creatinine, Ser 0.37 (L) 0.44 - 1.00 mg/dL   Calcium 8.3 (L) 8.9 - 10.3 mg/dL   Total Protein 7.2 6.5 - 8.1 g/dL   Albumin 3.5 3.5 - 5.0 g/dL   AST 308 (H) 15 - 41 U/L   ALT 1,158 (H) 14 - 54 U/L   Alkaline Phosphatase 110 38 - 126 U/L   Total Bilirubin 7.1 (H) 0.3 - 1.2 mg/dL   GFR calc non Af Amer >60 >60 mL/min   GFR calc Af Amer >60 >60 mL/min    Comment: (NOTE) The eGFR has been calculated using the CKD EPI equation. This calculation has not been validated in all clinical situations. eGFR's persistently <60 mL/min signify possible Chronic Kidney Disease.    Anion gap 8 5 - 15  CBC     Status: Abnormal   Collection Time: 09/17/17  4:45 AM  Result Value Ref Range   WBC 7.2 3.6 - 11.0 K/uL   RBC 4.85 3.80 - 5.20 MIL/uL   Hemoglobin 14.1 12.0 - 16.0 g/dL   HCT 42.0 35.0 - 47.0 %   MCV 86.5 80.0 - 100.0 fL   MCH 29.2 26.0 - 34.0 pg   MCHC 33.7 32.0 - 36.0 g/dL   RDW 15.1 (H) 11.5 - 14.5 %   Platelets 291 150 - 440 K/uL  ANA Comprehensive  Panel     Status: None   Collection Time: 09/17/17  4:45 AM  Result Value Ref Range   ds DNA Ab <1 0 - 9 IU/mL    Comment: (NOTE)                                   Negative      <5                                   Equivocal  5 - 9                                   Positive      >9    Ribonucleic Protein <0.2 0.0 - 0.9 AI   ENA SM Ab Ser-aCnc <0.2 0.0 - 0.9 AI   Scleroderma (Scl-70) (ENA) Antibody, IgG <0.2 0.0 - 0.9 AI   SSA (Ro) (ENA) Antibody, IgG <0.2 0.0 - 0.9 AI   SSB (La) (ENA) Antibody, IgG <0.2 0.0 - 0.9 AI   Chromatin Ab SerPl-aCnc <  0.2 0.0 - 0.9 AI   Anti JO-1 <0.2 0.0 - 0.9 AI   Centromere Ab Screen <0.2 0.0 - 0.9 AI   See below: Comment     Comment: (NOTE) Autoantibody                       Disease Association ------------------------------------------------------------                        Condition                  Frequency ---------------------   ------------------------   --------- Antinuclear Antibody,    SLE, mixed connective Direct (ANA-D)           tissue diseases ---------------------   ------------------------   --------- dsDNA                    SLE                        40 - 60% ---------------------   ------------------------   --------- Chromatin                Drug induced SLE                90%                         SLE                        48 - 97% ---------------------   ------------------------   --------- SSA (Ro)                 SLE                        25 - 35%                         Sjogren's Syndrome         40 - 70%                         Neonatal Lupus                 100% ---------------------   ------------------------   --------- SSB (La)                 SLE                              10%                         Sjogren's Syndrome              30% ---------------------   -----------------------    --------- Sm (anti-Smith)          SLE                        15 - 30% ---------------------   -----------------------     --------- RNP                      Mixed Connective Tissue  Disease                         95% (U1 nRNP,                SLE                        30 - 50% anti-ribonucleoprotein)  Polymyositis and/or                         Dermatomyositis                 20% ---------------------   ------------------------   --------- Scl-70 (antiDNA          Scleroderma (diffuse)      20 - 35% topoisomerase)           Crest                           13% ---------------------   ------------------------   --------- Jo-1                     Polymyositis and/or                         Dermatomyositis            20 - 40% ---------------------   ------------------------   --------- Centromere B             Scleroderma -  Crest                         variant                         80% Performed At: [facility] [place], [place] [id number] Rush Farmer MD [id number]   Anti-smooth muscle antibody, IgG     Status: None   Collection Time: 09/17/17  4:45 AM  Result Value Ref Range   F-Actin IgG 19 0 - 19 Units    Comment: (NOTE)                 Negative                     0 - 19                 Weak positive               20 - 30                 Moderate to strong positive     >30 Actin Antibodies are found in 52-85% of patients with autoimmune hepatitis or chronic active hepatitis and in 22% of patients with primary biliary cirrhosis. Performed At: [facility] [place], [place] [id number] Rush Farmer MD [id number]:[id number]   AntiMicrosomal Ab-Liver / Kidney     Status: None   Collection Time: 09/17/17  4:45 AM  Result Value Ref Range   LKM1 Ab 0.9 0.0 - 20.0 Units    Comment: (NOTE)                                Negative    0.0 - 20.0  Equivocal  20.1 - 24.9                                Positive         >24.9 LKM type 1 antibodies are detected in patients with autoimmune hepatitis type 2 and  in up to 8% of patients with chronic HCV infection. Performed At: [facility] [place], [place] [id number] Rush Farmer MD [id number]   Ceruloplasmin     Status: None   Collection Time: 09/17/17  4:45 AM  Result Value Ref Range   Ceruloplasmin 26.8 19.0 - 39.0 mg/dL    Comment: (NOTE) Performed At: [facility] [place], [place] [id number] Rush Farmer MD [id number]   CMV IgM     Status: None   Collection Time: 09/17/17  4:45 AM  Result Value Ref Range   CMV IgM <30.0 0.0 - 29.9 AU/mL    Comment: (NOTE)                                Negative         <30.0                                Equivocal  30.0 - 34.9                                Positive         >34.9 A positive result is generally indicative of acute infection, reactivation or persistent IgM production. Performed At: [facility] [place], [place] [id number] Rush Farmer MD [id number]   Epstein-Barr virus VCA, IgG     Status: Abnormal   Collection Time: 09/17/17  4:45 AM  Result Value Ref Range   EBV VCA IgG 268.0 (H) 0.0 - 17.9 U/mL    Comment: (NOTE)                                 Negative        <18.0                                 Equivocal 18.0 - 21.9                                 Positive        >21.9 Performed At: [facility] [place], [place] [id number] Rush Farmer MD [id number]   Epstein-Barr virus VCA, IgM     Status: None   Collection Time: 09/17/17  4:45 AM  Result Value Ref Range   EBV VCA IgM <36.0 0.0 - 35.9 U/mL    Comment: (NOTE)                                 Negative        <36.0  Equivocal 36.0 - 43.9                                 Positive        >43.9 Performed At: [facility] [place], [place] [id number] Rush Farmer MD [id number]   HIV antibody (Routine Testing)     Status: None   Collection Time:  09/17/17  4:45 AM  Result Value Ref Range   HIV Screen 4th Generation wRfx Non Reactive Non Reactive    Comment: (NOTE) Performed At: [facility] [place], [place] [id number] Rush Farmer MD [id number]   Immunoglobulins, QN, A/E/G/M     Status: Abnormal   Collection Time: 09/17/17  4:45 AM  Result Value Ref Range   IgG (Immunoglobin G), Serum 1,085 700 - 1,600 mg/dL   IgA 268 87 - 352 mg/dL   IgM (Immunoglobulin M), Srm 433 (H) 26 - 217 mg/dL   IgE (Immunoglobulin E), Serum 14 0 - 100 IU/mL    Comment: (NOTE) Performed At: [facility] [place], [place] [id number] Rush Farmer MD [id number]   Mitochondrial antibodies     Status: None   Collection Time: 09/17/17  4:45 AM  Result Value Ref Range   Mitochondrial M2 Ab, IgG 16.8 0.0 - 20.0 Units    Comment: (NOTE)                                Negative    0.0 - 20.0                                Equivocal  20.1 - 24.9                                Positive         >24.9 Mitochondrial (M2) Antibodies are found in 90-96% of patients with primary biliary cirrhosis. Performed At: [facility] [place], [place] [id number] Rush Farmer MD [id number]   Protein electrophoresis, serum     Status: None   Collection Time: 09/17/17  4:45 AM  Result Value Ref Range   Total Protein ELP 6.7 6.0 - 8.5 g/dL   Albumin ELP 3.3 2.9 - 4.4 g/dL   Alpha-1-Globulin 0.3 0.0 - 0.4 g/dL   Alpha-2-Globulin 0.6 0.4 - 1.0 g/dL   Beta Globulin 1.1 0.7 - 1.3 g/dL   Gamma Globulin 1.5 0.4 - 1.8 g/dL   M-Spike, % Not Observed Not Observed g/dL   SPE Interp. Comment     Comment: (NOTE) The SPE pattern appears essentially unremarkable. Evidence of monoclonal protein is not apparent. Performed At: [facility] [place], [place] [id number] Rush Farmer MD [id number]    Comment Comment     Comment: (NOTE) Protein electrophoresis scan will follow  via computer, mail, or courier delivery.    GLOBULIN, TOTAL 3.4 2.2 - 3.9 g/dL   A/G Ratio 1.0 0.7 - 1.7  RSV(respiratory syncytial virus) ab     Status: Abnormal   Collection Time: 09/17/17  4:45 AM  Result Value Ref Range   RSV Ab 1:16 (H) Neg:<1:8    Comment: (NOTE) Performed At: [facility] [address] [place], [place] [id number] Rush Farmer  MD [id number]   CK     Status: None   Collection Time: 09/17/17  4:45 AM  Result Value Ref Range   Total CK 47 38 - 234 U/L  Protime-INR     Status: None   Collection Time: 09/17/17 11:39 AM  Result Value Ref Range   Prothrombin Time 13.2 11.4 - 15.2 seconds   INR 1.01   Comprehensive metabolic panel     Status: Abnormal   Collection Time: 09/18/17  4:53 AM  Result Value Ref Range   Sodium 138 135 - 145 mmol/L   Potassium 3.8 3.5 - 5.1 mmol/L   Chloride 106 101 - 111 mmol/L   CO2 24 22 - 32 mmol/L   Glucose, Bld 79 65 - 99 mg/dL   BUN <5 (L) 6 - 20 mg/dL   Creatinine, Ser 0.48 0.44 - 1.00 mg/dL   Calcium 8.5 (L) 8.9 - 10.3 mg/dL   Total Protein 6.6 6.5 - 8.1 g/dL   Albumin 3.2 (L) 3.5 - 5.0 g/dL   AST 203 (H) 15 - 41 U/L   ALT 843 (H) 14 - 54 U/L   Alkaline Phosphatase 92 38 - 126 U/L   Total Bilirubin 6.3 (H) 0.3 - 1.2 mg/dL   GFR calc non Af Amer >60 >60 mL/min   GFR calc Af Amer >60 >60 mL/min    Comment: (NOTE) The eGFR has been calculated using the CKD EPI equation. This calculation has not been validated in all clinical situations. eGFR's persistently <60 mL/min signify possible Chronic Kidney Disease.    Anion gap 8 5 - 15     Assessment & Plan  1. Acute hepatitis A Active but resolving, obtain serology and liver enzymes, educated on dietary and OTC medication interaction. Follow up with Gastroenterology. - Hepatitis A antibody, IgM - Hepatic function panel  2. Pain in left leg Obtain doppler ultrasound to rule out DVT - DOPPLER VENOUS LEGS BILATERAL; Future  3. Acute hip pain, left  -  DG HIP UNILAT WITH PELVIS MIN 4 VIEWS LEFT; Future   Mithcell Schumpert [facility] 09/25/2017 2:14 PM

## 2017-09-26 ENCOUNTER — Inpatient Hospital Stay: Payer: Managed Care, Other (non HMO) | Admitting: Family Medicine

## 2017-09-26 LAB — HEPATIC FUNCTION PANEL
AG RATIO: 1.4 (calc) (ref 1.0–2.5)
ALKALINE PHOSPHATASE (APISO): 91 U/L (ref 33–115)
ALT: 258 U/L — AB (ref 6–29)
AST: 63 U/L — AB (ref 10–30)
Albumin: 4.6 g/dL (ref 3.6–5.1)
BILIRUBIN DIRECT: 1.2 mg/dL — AB (ref 0.0–0.2)
BILIRUBIN INDIRECT: 1.5 mg/dL — AB (ref 0.2–1.2)
BILIRUBIN TOTAL: 2.7 mg/dL — AB (ref 0.2–1.2)
Globulin: 3.4 g/dL (calc) (ref 1.9–3.7)
Total Protein: 8 g/dL (ref 6.1–8.1)

## 2017-09-26 LAB — HEPATITIS A ANTIBODY, IGM: HEP A IGM: REACTIVE — AB

## 2017-10-01 NOTE — Progress Notes (Deleted)
ANNUAL PREVENTATIVE CARE GYN  ENCOUNTER NOTE  Subjective:       Christy Farmer is a 31 y.o. 591P1001 female here for a routine annual gynecologic exam.  Current complaints: 1.   None  Since the Mirena IUD has been placed, cramps have diminished significantly. She is having intermittent spotting. She is monogamous with 1 partner. She is contemplating regnancy next year   Gynecologic History No LMP recorded. Patient is not currently having periods (Reason: IUD). Contraception: IUD - inserted 03/2016 mirena Last Pap: 06/20/2016 neg/neg  Results were: normal Last mammogram: n/a. Results were:   Obstetric History OB History  Gravida Para Term Preterm AB Living  1 1 1     1   SAB TAB Ectopic Multiple Live Births          1    # Outcome Date GA Lbr Len/2nd Weight Sex Delivery Anes PTL Lv  1 Term    6 lb 14.4 oz (3.13 kg) M Vag-Spont   LIV      Past Medical History:  Diagnosis Date  . Abnormal uterine bleeding (AUB)   . Allergy    seasonal allergies.  . Asthma   . Constipation   . Enlarged uterus   . Heavy periods   . Hepatitis A 09/16/2017  . Vaginal Pap smear, abnormal     Past Surgical History:  Procedure Laterality Date  . COLPOSCOPY      Current Outpatient Medications on File Prior to Visit  Medication Sig Dispense Refill  . ibuprofen (ADVIL,MOTRIN) 200 MG tablet Take 200 mg by mouth every 6 (six) hours as needed.    Marland Kitchen. levonorgestrel (MIRENA) 20 MCG/24HR IUD 1 each by Intrauterine route once.    . ondansetron (ZOFRAN) 4 MG tablet Take 1 tablet (4 mg total) by mouth every 6 (six) hours as needed for nausea. (Patient not taking: Reported on 09/25/2017) 20 tablet 0   No current facility-administered medications on file prior to visit.     No Known Allergies  Social History   Socioeconomic History  . Marital status: Single    Spouse name: Not on file  . Number of children: Not on file  . Years of education: Not on file  . Highest education level: Not on file   Social Needs  . Financial resource strain: Not on file  . Food insecurity - worry: Not on file  . Food insecurity - inability: Not on file  . Transportation needs - medical: Not on file  . Transportation needs - non-medical: Not on file  Occupational History  . Not on file  Tobacco Use  . Smoking status: Never Smoker  . Smokeless tobacco: Never Used  Substance and Sexual Activity  . Alcohol use: Yes    Alcohol/week: 1.2 oz    Types: 2 Glasses of wine per week  . Drug use: No  . Sexual activity: Yes    Birth control/protection: IUD  Other Topics Concern  . Not on file  Social History Narrative  . Not on file    Family History  Problem Relation Age of Onset  . Hyperlipidemia Mother   . Hypertension Mother   . Healthy Father   . Healthy Brother   . Cancer Neg Hx   . Diabetes Neg Hx   . Heart disease Neg Hx     The following portions of the patient's history were reviewed and updated as appropriate: allergies, current medications, past family history, past medical history, past social history, past surgical history  and problem list.  Review of Systems ROS   Objective:   There were no vitals taken for this visit. CONSTITUTIONAL: Well-developed, well-nourished female in no acute distress.  PSYCHIATRIC: Normal mood and affect. Normal behavior. Normal judgment and thought content. NEUROLGIC: Alert and oriented to person, place, and time. Normal muscle tone coordination. No cranial nerve deficit noted. HENT:  Normocephalic, atraumatic, External right and left ear normal. Oropharynx is clear and moist EYES: Conjunctivae and EOM are normal. Pupils are equal, round, and reactive to light. No scleral icterus.  NECK: Normal range of motion, supple, no masses.  Normal thyroid.  SKIN: Skin is warm and dry. No rash noted. Not diaphoretic. No erythema. No pallor. CARDIOVASCULAR: Normal heart rate noted, regular rhythm, no murmur. RESPIRATORY: Clear to auscultation bilaterally.  Effort and breath sounds normal, no problems with respiration noted. BREASTS: Symmetric in size. No masses, skin changes, nipple drainage, or lymphadenopathy. ABDOMEN: Soft, normal bowel sounds, no distention noted.  No tenderness, rebound or guarding.  BLADDER: Normal PELVIC:  External Genitalia: Normal  BUS: Normal  Vagina: Normal  Cervix: Normal; IUD strings visualized-3 cm; no cervical motion tenderness  Uterus: Mid plane to anteverted; top normal size, globular, nontender  Adnexa: Normal  RV: External Exam NormaI  MUSCULOSKELETAL: Normal range of motion. No tenderness.  No cyanosis, clubbing, or edema.  2+ distal pulses. LYMPHATIC: No Axillary, Supraclavicular, or Inguinal Adenopathy.    Assessment:   Annual gynecologic examination 31 y.o. Contraception: IUD Normal BMI Problem List Items Addressed This Visit    None    Visit Diagnoses    Well woman exam with routine gynecological exam    -  Primary      Plan:  Pap: Due 2020 Mammogram: Not Indicated Stool Guaiac Testing:  Not Indicated Labs: tsh vit d lipid a1c fbs Routine preventative health maintenance measures emphasized: Exercise/Diet/Weight control, Tobacco Warnings, Alcohol/Substance use risks and Safe Sex Return to Clinic - 1 Year   Arcadio Cope [place] P[place], [place]   Note: This dictation was prepared with Dragon dictation along with smaller phrase technology. Any transcriptional errors that result from this process are unintentional.

## 2017-10-02 ENCOUNTER — Encounter: Payer: Managed Care, Other (non HMO) | Admitting: Obstetrics and Gynecology

## 2017-10-04 ENCOUNTER — Ambulatory Visit (INDEPENDENT_AMBULATORY_CARE_PROVIDER_SITE_OTHER): Payer: BLUE CROSS/BLUE SHIELD | Admitting: Gastroenterology

## 2017-10-04 ENCOUNTER — Encounter: Payer: Self-pay | Admitting: Gastroenterology

## 2017-10-04 VITALS — BP 114/67 | HR 69 | Ht 64.0 in | Wt 150.0 lb

## 2017-10-04 DIAGNOSIS — B159 Hepatitis A without hepatic coma: Secondary | ICD-10-CM | POA: Diagnosis not present

## 2017-10-04 NOTE — Progress Notes (Signed)
Primary Care Physician: Ellyn Hack, MD  Primary Gastroenterologist:  Dr. Midge Minium  Chief Complaint  Patient presents with  . Hospitalization Follow-up  . Hepatitis A    HPI: Christy Farmer is a 31 y.o. female here for follow-up after being in the hospital with acute hepatitis A.  The patient had recent labs that showed her liver enzymes to have come down but not back to normal.  The patient reports that she is been doing well but feels tired.  The patient states that her urine has gotten lighter and she has no abdominal pain fevers chills nausea or vomiting. The patient has been concerned about her diet and was on the Internet and was scared that she can still have liver failure.  Current Outpatient Medications  Medication Sig Dispense Refill  . ibuprofen (ADVIL,MOTRIN) 200 MG tablet Take 200 mg by mouth every 6 (six) hours as needed.    Marland Kitchen levonorgestrel (MIRENA) 20 MCG/24HR IUD 1 each by Intrauterine route once.    . ondansetron (ZOFRAN) 4 MG tablet Take 1 tablet (4 mg total) by mouth every 6 (six) hours as needed for nausea. (Patient not taking: Reported on 09/25/2017) 20 tablet 0   No current facility-administered medications for this visit.     Allergies as of 10/04/2017  . (No Known Allergies)    ROS:  General: Negative for anorexia, weight loss, fever, chills, fatigue, weakness. ENT: Negative for hoarseness, difficulty swallowing , nasal congestion. CV: Negative for chest pain, angina, palpitations, dyspnea on exertion, peripheral edema.  Respiratory: Negative for dyspnea at rest, dyspnea on exertion, cough, sputum, wheezing.  GI: See history of present illness. GU:  Negative for dysuria, hematuria, urinary incontinence, urinary frequency, nocturnal urination.  Endo: Negative for unusual weight change.    Physical Examination:   BP 114/67   Pulse 69   Ht 5\' 4"  (1.626 m)   Wt 150 lb (68 kg)   BMI 25.75 kg/m   General: Well-nourished,  well-developed in no acute distress.  Eyes: No icterus. Conjunctivae pink. Mouth: Oropharyngeal mucosa moist and pink , no lesions erythema or exudate. Lungs: Clear to auscultation bilaterally. Non-labored. Heart: Regular rate and rhythm, no murmurs rubs or gallops.  Abdomen: Bowel sounds are normal, nontender, nondistended, no hepatosplenomegaly or masses, no abdominal bruits or hernia , no rebound or guarding.   Extremities: No lower extremity edema. No clubbing or deformities. Neuro: Alert and oriented x 3.  Grossly intact. Skin: Warm and dry, no jaundice.   Psych: Alert and cooperative, normal mood and affect.  Labs:    Imaging Studies: Mr 3d Recon At Scanner  Result Date: 09/17/2017 CLINICAL DATA:  Abnormal liver function tests.  Jaundice. EXAM: MRI ABDOMEN WITHOUT AND WITH CONTRAST (INCLUDING MRCP) TECHNIQUE: Multiplanar multisequence MR imaging of the abdomen was performed both before and after the administration of intravenous contrast. Heavily T2-weighted images of the biliary and pancreatic ducts were obtained, and three-dimensional MRCP images were rendered by post processing. CONTRAST:  14mL MULTIHANCE GADOBENATE DIMEGLUMINE 529 MG/ML IV SOLN COMPARISON:  Ultrasound dated 09/16/2017 FINDINGS: Lower chest: Unremarkable Hepatobiliary: No findings of hepatic steatosis. Thick-walled small caliber gallbladder. The common bile duct measures up to 6 mm in diameter, mildly dilated for age, and demonstrates normal conical tapering course distally without observed filling defect. No significant intrahepatic biliary dilatation. No focal abnormality in the liver or specific abnormal hepatic enhancement is identified. No morphologic features of cirrhosis. Pancreas:  Unremarkable Spleen:  Unremarkable Adrenals/Urinary Tract:  Unremarkable Stomach/Bowel: Unremarkable Vascular/Lymphatic:  Unremarkable Other:  No supplemental non-categorized findings. Musculoskeletal: Unremarkable IMPRESSION: 1. The  gallbladder is thick-walled and small in caliber, potentially from inflammation or contraction. Similar appearance to the prior ultrasound. 2. Mildly dilated common bile duct at 6 mm diameter, but without obvious filling defect to suggest a cause for obstruction. The distal CBD appears to taper in a conical fashion. Possibilities for the patient's elevated AST and ALT along with this appearance may include hepatocellular dysfunction with incidental mild biliary dilatation, or some type of mild ampullary stricture. I do not see significant abnormal enhancement in the ampulla and the dorsal pancreatic duct is of normal caliber. If further imaging workup is warranted, nuclear medicine hepatobiliary scan with gallbladder ejection fraction assessment could be utilized to assess for hepatocellular function, patency of the CBD, as well as gallbladder function. Electronically Signed   By: Gaylyn RongWalter  Liebkemann M.D.   On: 09/17/2017 10:34   [place] Venous Img Lower Bilateral  Result Date: 09/25/2017 CLINICAL DATA:  Left leg pain for 5 days. EXAM: BILATERAL LOWER EXTREMITY VENOUS DOPPLER ULTRASOUND TECHNIQUE: Gray-scale sonography with graded compression, as well as color Doppler and duplex ultrasound were performed to evaluate the lower extremity deep venous systems from the level of the common femoral vein and including the common femoral, femoral, profunda femoral, popliteal and calf veins including the posterior tibial, peroneal and gastrocnemius veins when visible. The superficial great saphenous vein was also interrogated. Spectral Doppler was utilized to evaluate flow at rest and with distal augmentation maneuvers in the common femoral, femoral and popliteal veins. COMPARISON:  None. FINDINGS: RIGHT LOWER EXTREMITY Common Femoral Vein: No evidence of thrombus. Normal compressibility, respiratory phasicity and response to augmentation. Saphenofemoral Junction: No evidence of thrombus. Normal compressibility and flow on  color Doppler imaging. Profunda Femoral Vein: No evidence of thrombus. Normal compressibility and flow on color Doppler imaging. Femoral Vein: No evidence of thrombus. Normal compressibility, respiratory phasicity and response to augmentation. Popliteal Vein: No evidence of thrombus. Normal compressibility, respiratory phasicity and response to augmentation. Calf Veins: No evidence of thrombus. Normal compressibility and flow on color Doppler imaging. LEFT LOWER EXTREMITY Common Femoral Vein: No evidence of thrombus. Normal compressibility, respiratory phasicity and response to augmentation. Saphenofemoral Junction: No evidence of thrombus. Normal compressibility and flow on color Doppler imaging. Profunda Femoral Vein: No evidence of thrombus. Normal compressibility and flow on color Doppler imaging. Femoral Vein: No evidence of thrombus. Normal compressibility, respiratory phasicity and response to augmentation. Popliteal Vein: No evidence of thrombus. Normal compressibility, respiratory phasicity and response to augmentation. Calf Veins: No evidence of thrombus. Normal compressibility and flow on color Doppler imaging. IMPRESSION: No evidence of deep venous thrombosis in the lower extremities. Electronically Signed   By: Richarda OverlieAdam  Henn M.D.   On: 09/25/2017 16:27   Mr Abdomen Mrcp Vivien RossettiW Wo Contast  Result Date: 09/17/2017 CLINICAL DATA:  Abnormal liver function tests.  Jaundice. EXAM: MRI ABDOMEN WITHOUT AND WITH CONTRAST (INCLUDING MRCP) TECHNIQUE: Multiplanar multisequence MR imaging of the abdomen was performed both before and after the administration of intravenous contrast. Heavily T2-weighted images of the biliary and pancreatic ducts were obtained, and three-dimensional MRCP images were rendered by post processing. CONTRAST:  14mL MULTIHANCE GADOBENATE DIMEGLUMINE 529 MG/ML IV SOLN COMPARISON:  Ultrasound dated 09/16/2017 FINDINGS: Lower chest: Unremarkable Hepatobiliary: No findings of hepatic steatosis.  Thick-walled small caliber gallbladder. The common bile duct measures up to 6 mm in diameter, mildly dilated for age, and demonstrates normal conical tapering course distally  without observed filling defect. No significant intrahepatic biliary dilatation. No focal abnormality in the liver or specific abnormal hepatic enhancement is identified. No morphologic features of cirrhosis. Pancreas:  Unremarkable Spleen:  Unremarkable Adrenals/Urinary Tract:  Unremarkable Stomach/Bowel: Unremarkable Vascular/Lymphatic:  Unremarkable Other:  No supplemental non-categorized findings. Musculoskeletal: Unremarkable IMPRESSION: 1. The gallbladder is thick-walled and small in caliber, potentially from inflammation or contraction. Similar appearance to the prior ultrasound. 2. Mildly dilated common bile duct at 6 mm diameter, but without obvious filling defect to suggest a cause for obstruction. The distal CBD appears to taper in a conical fashion. Possibilities for the patient's elevated AST and ALT along with this appearance may include hepatocellular dysfunction with incidental mild biliary dilatation, or some type of mild ampullary stricture. I do not see significant abnormal enhancement in the ampulla and the dorsal pancreatic duct is of normal caliber. If further imaging workup is warranted, nuclear medicine hepatobiliary scan with gallbladder ejection fraction assessment could be utilized to assess for hepatocellular function, patency of the CBD, as well as gallbladder function. Electronically Signed   By: Gaylyn RongWalter  Liebkemann M.D.   On: 09/17/2017 10:34   Dg Hip Unilat With Pelvis 2-3 Views Left  Result Date: 09/25/2017 CLINICAL DATA:  Left hip pain radiating to the left leg EXAM: DG HIP (WITH OR WITHOUT PELVIS) 2-3V LEFT COMPARISON:  None. FINDINGS: The left hip joint space appears normal and the left femoral head is in normal position. No acute abnormality is seen. The pelvic rami are intact. The SI joints are  corticated. IUD is noted centrally within the pelvis. IMPRESSION: Negative. Electronically Signed   By: Dwyane DeePaul  Barry M.D.   On: 09/25/2017 16:00   [place] Abdomen Limited Ruq  Result Date: 09/16/2017 CLINICAL DATA:  Abdominal pain for 1 and 1/2 weeks with nausea and vomiting. EXAM: ULTRASOUND ABDOMEN LIMITED RIGHT UPPER QUADRANT COMPARISON:  None. FINDINGS: Gallbladder: Gallbladder is mostly contracted. No gallstones. No significant wall thickening. No pericholecystic fluid. Common bile duct: Diameter: 7 mm. No duct stone visualized. Distal aspect of the duct was obscured by bowel gas. Liver: No focal lesion identified. Within normal limits in parenchymal echogenicity. Portal vein is patent on color Doppler imaging with normal direction of blood flow towards the liver. IMPRESSION: 1. Dilated common bile duct. No duct stone visualized. If there are symptoms of biliary obstruction, follow-up ERCP or MRCP would be recommended. 2. No other abnormalities.  No evidence of acute cholecystitis. Electronically Signed   By: Amie Portlandavid  Ormond M.D.   On: 09/16/2017 18:27    Assessment and Plan:   Tennelle Leonides SakeFletes Castro is a 31 y.o. y/o female who had acute hepatitis A after going to [place]. The patient appears to be resolving her hepatitis A.  The patient has also been told that she should continue to wash her hands and be cautious call us she can still shed the virus.  The patient has been told that she can eat whatever she wants then she should have her liver enzymes checked again in 2 weeks.  The patient has been explained the plan and agrees with it.    Midge Miniumarren Aleda Madl, MD. Clementeen GrahamFACG   Note: This dictation was prepared with Dragon dictation along with smaller phrase technology. Any transcriptional errors that result from this process are unintentional.

## 2017-10-12 NOTE — Progress Notes (Signed)
ANNUAL PREVENTATIVE CARE GYN  ENCOUNTER NOTE  Subjective:       Christy Farmer is a 31 y.o. 721P1001 female here for a routine annual gynecologic exam.  Current complaints: 1.   None  Since the Mirena IUD has been placed, cramps have diminished significantly. She is having intermittent spotting. She is monogamous with 1 partner. She is contemplating pregnancy next year.  Recently diagnosed with hepatitis A in December 2018, hospitalized times 3 days, symptoms resolving at this time. Patient had travel to [place] to see family in October 2018.  No other family members were ill.   Gynecologic History No LMP recorded. Patient is not currently having periods (Reason: IUD). Contraception: IUD - inserted 03/2016 mirena Last Pap: 06/20/2016 neg/neg  Results were: normal Last mammogram: n/a. Results were:   Obstetric History OB History  Gravida Para Term Preterm AB Living  1 1 1     1   SAB TAB Ectopic Multiple Live Births          1    # Outcome Date GA Lbr Len/2nd Weight Sex Delivery Anes PTL Lv  1 Term    6 lb 14.4 oz (3.13 kg) M Vag-Spont   LIV      Past Medical History:  Diagnosis Date  . Abnormal uterine bleeding (AUB)   . Allergy    seasonal allergies.  . Asthma   . Constipation   . Enlarged uterus   . Heavy periods   . Hepatitis A 09/16/2017  . Vaginal Pap smear, abnormal     Past Surgical History:  Procedure Laterality Date  . COLPOSCOPY      Current Outpatient Medications on File Prior to Visit  Medication Sig Dispense Refill  . ibuprofen (ADVIL,MOTRIN) 200 MG tablet Take 200 mg by mouth every 6 (six) hours as needed.    Marland Kitchen. levonorgestrel (MIRENA) 20 MCG/24HR IUD 1 each by Intrauterine route once.    . ondansetron (ZOFRAN) 4 MG tablet Take 1 tablet (4 mg total) by mouth every 6 (six) hours as needed for nausea. (Patient not taking: Reported on 09/25/2017) 20 tablet 0   No current facility-administered medications on file prior to visit.     No Known  Allergies  Social History   Socioeconomic History  . Marital status: Single    Spouse name: Not on file  . Number of children: Not on file  . Years of education: Not on file  . Highest education level: Not on file  Social Needs  . Financial resource strain: Not on file  . Food insecurity - worry: Not on file  . Food insecurity - inability: Not on file  . Transportation needs - medical: Not on file  . Transportation needs - non-medical: Not on file  Occupational History  . Not on file  Tobacco Use  . Smoking status: Never Smoker  . Smokeless tobacco: Never Used  Substance and Sexual Activity  . Alcohol use: Yes    Alcohol/week: 1.2 oz    Types: 2 Glasses of wine per week  . Drug use: No  . Sexual activity: Yes    Birth control/protection: IUD  Other Topics Concern  . Not on file  Social History Narrative  . Not on file    Family History  Problem Relation Age of Onset  . Hyperlipidemia Mother   . Hypertension Mother   . Healthy Father   . Healthy Brother   . Cancer Neg Hx   . Diabetes Neg Hx   .  Heart disease Neg Hx     The following portions of the patient's history were reviewed and updated as appropriate: allergies, current medications, past family history, past medical history, past social history, past surgical history and problem list.  Review of Systems Review of Systems  Constitutional: Positive for malaise/fatigue.  HENT: Negative.   Eyes: Negative.   Respiratory: Negative.   Cardiovascular: Negative.   Gastrointestinal: Negative.   Genitourinary: Negative.   Musculoskeletal: Negative.   Skin: Negative.   Neurological: Negative.   Endo/Heme/Allergies: Negative.   Psychiatric/Behavioral: Negative.      Objective:   BP 131/77   Pulse 71   Ht 5\' 4"  (1.626 m)   Wt 144 lb 9.6 oz (65.6 kg)   LMP  (LMP Unknown) Comment: spotting  BMI 24.82 kg/m  CONSTITUTIONAL: Well-developed, well-nourished female in no acute distress.  PSYCHIATRIC: Normal  mood and affect. Normal behavior. Normal judgment and thought content. NEUROLGIC: Alert and oriented to person, place, and time. Normal muscle tone coordination. No cranial nerve deficit noted. HENT:  Normocephalic, atraumatic, External right and left ear normal. Oropharynx is clear and moist EYES: Conjunctivae and EOM are normal. No scleral icterus.  NECK: Normal range of motion, supple, no masses.  Normal thyroid.  SKIN: Skin is warm and dry. No rash noted. Not diaphoretic. No erythema. No pallor. CARDIOVASCULAR: Normal heart rate noted, regular rhythm, no murmur. RESPIRATORY: Clear to auscultation bilaterally. Effort and breath sounds normal, no problems with respiration noted. BREASTS: Symmetric in size. No masses, skin changes, nipple drainage, or lymphadenopathy. ABDOMEN: Soft, normal bowel sounds, no distention noted.  No tenderness, rebound or guarding.  BLADDER: Normal PELVIC:  External Genitalia: Normal  BUS: Normal  Vagina: Normal  Cervix: Normal; IUD strings visualized-3 cm; no cervical motion tenderness  Uterus: Mid plane to anteverted; top normal size, globular, nontender  Adnexa: Normal; nonpalpable nontender  RV: External Exam NormaI  MUSCULOSKELETAL: Normal range of motion. No tenderness.  No cyanosis, clubbing, or edema.  2+ distal pulses. LYMPHATIC: No Axillary, Supraclavicular, or Inguinal Adenopathy.    Assessment:   Annual gynecologic examination 31 y.o. Contraception: IUD Normal BMI Problem List Items Addressed This Visit    History of abnormal cervical Pap smear    Other Visit Diagnoses    Well woman exam with routine gynecological exam    -  Primary   Contraception, device intrauterine        History of hepatitis A diagnosed in December 2018, resolving  Plan:  Pap: Due 2020 Mammogram: Not Indicated Stool Guaiac Testing:  Not Indicated Labs: tsh vit d lipid a1c fbs and hepatic profile Routine preventative health maintenance measures emphasized:  Exercise/Diet/Weight control, Tobacco Warnings, Alcohol/Substance use risks and Safe Sex Return to Clinic - 1 Year   Crystal WhitesideMiller, [place]MA  Christy HarmsMartin A Sravya Grissom, MD  Note: This dictation was prepared with Dragon dictation along with smaller phrase technology. Any transcriptional errors that result from this process are unintentional.

## 2017-10-16 HISTORY — PX: ANKLE SURGERY: SHX546

## 2017-10-17 ENCOUNTER — Encounter: Payer: Self-pay | Admitting: Podiatry

## 2017-10-17 ENCOUNTER — Ambulatory Visit (INDEPENDENT_AMBULATORY_CARE_PROVIDER_SITE_OTHER): Payer: BLUE CROSS/BLUE SHIELD | Admitting: Podiatry

## 2017-10-17 ENCOUNTER — Ambulatory Visit (INDEPENDENT_AMBULATORY_CARE_PROVIDER_SITE_OTHER): Payer: BLUE CROSS/BLUE SHIELD

## 2017-10-17 DIAGNOSIS — M7751 Other enthesopathy of right foot: Secondary | ICD-10-CM | POA: Diagnosis not present

## 2017-10-17 DIAGNOSIS — M779 Enthesopathy, unspecified: Secondary | ICD-10-CM | POA: Diagnosis not present

## 2017-10-17 MED ORDER — METHYLPREDNISOLONE 4 MG PO TBPK: ORAL_TABLET | ORAL | 0 refills | Status: DC

## 2017-10-17 NOTE — Progress Notes (Signed)
  Subjective:  Patient ID: Christy Farmer, female    DOB: 08-Apr-1986,  MRN: [id number] HPI Chief Complaint  Patient presents with  . Foot Pain    Posterior heel and lateral ankle right - aching x 4-5 months, popping wtih ROM, no injury, worse with heels, tried no heels for few months, but no help    32 y.o. female presents with the above complaint.     Past Medical History:  Diagnosis Date  . Abnormal uterine bleeding (AUB)   . Allergy    seasonal allergies.  . Asthma   . Constipation   . Enlarged uterus   . Heavy periods   . Hepatitis A 09/16/2017  . Vaginal Pap smear, abnormal    Past Surgical History:  Procedure Laterality Date  . COLPOSCOPY      Current Outpatient Medications:  .  ibuprofen (ADVIL,MOTRIN) 200 MG tablet, Take 200 mg by mouth every 6 (six) hours as needed., Disp: , Rfl:  .  levonorgestrel (MIRENA) 20 MCG/24HR IUD, 1 each by Intrauterine route once., Disp: , Rfl:   No Known Allergies Review of Systems  Musculoskeletal: Positive for arthralgias.  All other systems reviewed and are negative.  Objective:  There were no vitals filed for this visit.  General: Well developed, nourished, in no acute distress, alert and oriented x3   Dermatological: Skin is warm, dry and supple bilateral. Nails x 10 are well maintained; remaining integument appears unremarkable at this time. There are no open sores, no preulcerative lesions, no rash or signs of infection present.  Vascular: Dorsalis Pedis artery and Posterior Tibial artery pedal pulses are 2/4 bilateral with immedate capillary fill time. Pedal hair growth present. No varicosities and no lower extremity edema present bilateral.   Neruologic: Grossly intact via light touch bilateral. Vibratory intact via tuning fork bilateral. Protective threshold with Semmes Wienstein monofilament intact to all pedal sites bilateral. Patellar and Achilles deep tendon reflexes 2+ bilateral. No Babinski or clonus noted  bilateral.   Musculoskeletal: No gross boney pedal deformities bilateral. No pain, crepitus, or limitation noted with foot and ankle range of motion bilateral. Muscular strength 5/5 in all groups tested bilateral.  Gait: Unassisted, Nonantalgic.    Radiographs:  Radiographs 5 views taken today of the l right foot and ankle demonstrates primarily no major osseous findings other than what appears to be a osteochondral lesion to the medial aspect of the talar dome.  Assessment & Plan:   Assessment: Ankle joint capsulitis cannot rule out osteochondral defect right ankle.  Posterior superior posterior tibial tendon pain.  Probable tendinitis.  Plan: We discussed the etiology pathology conservative versus surgical therapies.  We discussed appropriate shoe gear stretching exercises ice therapy and shoe gear modifications.  Wrote her on a Medrol Dosepak.  I also injected the anterior ankle with 20 mg of Kenalog 5 mg of Marcaine after sterile Betadine skin prep.  Also injected dexamethasone and local anesthetic into the Achilles and posterior tibial tendon spaces.  The tolerated this well and I will follow-up with her in 1 month.  If she is not improved an MRI may be necessary to consider surgical intervention of the ankle.     Chett Taniguchi T. PineviewHyatt, [place]

## 2017-10-18 ENCOUNTER — Encounter: Payer: Self-pay | Admitting: Obstetrics and Gynecology

## 2017-10-18 ENCOUNTER — Ambulatory Visit (INDEPENDENT_AMBULATORY_CARE_PROVIDER_SITE_OTHER): Payer: BLUE CROSS/BLUE SHIELD | Admitting: Obstetrics and Gynecology

## 2017-10-18 VITALS — BP 131/77 | HR 71 | Ht 64.0 in | Wt 144.6 lb

## 2017-10-18 DIAGNOSIS — Z01419 Encounter for gynecological examination (general) (routine) without abnormal findings: Secondary | ICD-10-CM

## 2017-10-18 DIAGNOSIS — B159 Hepatitis A without hepatic coma: Secondary | ICD-10-CM

## 2017-10-18 DIAGNOSIS — Z975 Presence of (intrauterine) contraceptive device: Secondary | ICD-10-CM

## 2017-10-18 DIAGNOSIS — Z87898 Personal history of other specified conditions: Secondary | ICD-10-CM | POA: Diagnosis not present

## 2017-10-18 DIAGNOSIS — Z8742 Personal history of other diseases of the female genital tract: Secondary | ICD-10-CM

## 2017-10-18 NOTE — Patient Instructions (Signed)
1.  No Pap smear needed. Next Pap smear is due in 2020. 2.  Self breast awareness is encouraged 3.  Screening labs are obtained today, including hepatic profile to assess resolving hepatitis diagnoses 4.  Continue with healthy eating and exercise 5.  Recommend multivitamin or prenatal vitamin daily 6.  Return in May 2019 for IUD removal 7.  Return in 1 year for annual exam  Health Maintenance, Female Adopting a healthy lifestyle and getting preventive care can go a long way to promote health and wellness. Talk with your health care provider about what schedule of regular examinations is right for you. This is a good chance for you to check in with your provider about disease prevention and staying healthy. In between checkups, there are plenty of things you can do on your own. Experts have done a lot of research about which lifestyle changes and preventive measures are most likely to keep you healthy. Ask your health care provider for more information. Weight and diet Eat a healthy diet  Be sure to include plenty of vegetables, fruits, low-fat dairy products, and lean protein.  Do not eat a lot of foods high in solid fats, added sugars, or salt.  Get regular exercise. This is one of the most important things you can do for your health. ? Most adults should exercise for at least 150 minutes each week. The exercise should increase your heart rate and make you sweat (moderate-intensity exercise). ? Most adults should also do strengthening exercises at least twice a week. This is in addition to the moderate-intensity exercise.  Maintain a healthy weight  Body mass index (BMI) is a measurement that can be used to identify possible weight problems. It estimates body fat based on height and weight. Your health care provider can help determine your BMI and help you achieve or maintain a healthy weight.  For females 20 years of age and older: ? A BMI below 18.5 is considered underweight. ? A BMI  of 18.5 to 24.9 is normal. ? A BMI of 25 to 29.9 is considered overweight. ? A BMI of 30 and above is considered obese.  Watch levels of cholesterol and blood lipids  You should start having your blood tested for lipids and cholesterol at 32 years of age, then have this test every 5 years.  You may need to have your cholesterol levels checked more often if: ? Your lipid or cholesterol levels are high. ? You are older than 32 years of age. ? You are at high risk for heart disease.  Cancer screening Lung Cancer  Lung cancer screening is recommended for adults 69-69 years old who are at high risk for lung cancer because of a history of smoking.  A yearly low-dose CT scan of the lungs is recommended for people who: ? Currently smoke. ? Have quit within the past 15 years. ? Have at least a 30-pack-year history of smoking. A pack year is smoking an average of one pack of cigarettes a day for 1 year.  Yearly screening should continue until it has been 15 years since you quit.  Yearly screening should stop if you develop a health problem that would prevent you from having lung cancer treatment.  Breast Cancer  Practice breast self-awareness. This means understanding how your breasts normally appear and feel.  It also means doing regular breast self-exams. Let your health care provider know about any changes, no matter how small.  If you are in your 20s or 30s,  you should have a clinical breast exam (CBE) by a health care provider every 1-3 years as part of a regular health exam.  If you are 36 or older, have a CBE every year. Also consider having a breast X-ray (mammogram) every year.  If you have a family history of breast cancer, talk to your health care provider about genetic screening.  If you are at high risk for breast cancer, talk to your health care provider about having an MRI and a mammogram every year.  Breast cancer gene (BRCA) assessment is recommended for women who have  family members with BRCA-related cancers. BRCA-related cancers include: ? Breast. ? Ovarian. ? Tubal. ? Peritoneal cancers.  Results of the assessment will determine the need for genetic counseling and BRCA1 and BRCA2 testing.  Cervical Cancer Your health care provider may recommend that you be screened regularly for cancer of the pelvic organs (ovaries, uterus, and vagina). This screening involves a pelvic examination, including checking for microscopic changes to the surface of your cervix (Pap test). You may be encouraged to have this screening done every 3 years, beginning at age 59.  For women ages 37-65, health care providers may recommend pelvic exams and Pap testing every 3 years, or they may recommend the Pap and pelvic exam, combined with testing for human papilloma virus (HPV), every 5 years. Some types of HPV increase your risk of cervical cancer. Testing for HPV may also be done on women of any age with unclear Pap test results.  Other health care providers may not recommend any screening for nonpregnant women who are considered low risk for pelvic cancer and who do not have symptoms. Ask your health care provider if a screening pelvic exam is right for you.  If you have had past treatment for cervical cancer or a condition that could lead to cancer, you need Pap tests and screening for cancer for at least 20 years after your treatment. If Pap tests have been discontinued, your risk factors (such as having a new sexual partner) need to be reassessed to determine if screening should resume. Some women have medical problems that increase the chance of getting cervical cancer. In these cases, your health care provider may recommend more frequent screening and Pap tests.  Colorectal Cancer  This type of cancer can be detected and often prevented.  Routine colorectal cancer screening usually begins at 32 years of age and continues through 32 years of age.  Your health care provider may  recommend screening at an earlier age if you have risk factors for colon cancer.  Your health care provider may also recommend using home test kits to check for hidden blood in the stool.  A small camera at the end of a tube can be used to examine your colon directly (sigmoidoscopy or colonoscopy). This is done to check for the earliest forms of colorectal cancer.  Routine screening usually begins at age 68.  Direct examination of the colon should be repeated every 5-10 years through 32 years of age. However, you may need to be screened more often if early forms of precancerous polyps or small growths are found.  Skin Cancer  Check your skin from head to toe regularly.  Tell your health care provider about any new moles or changes in moles, especially if there is a change in a mole's shape or color.  Also tell your health care provider if you have a mole that is larger than the size of a pencil eraser.  Always use sunscreen. Apply sunscreen liberally and repeatedly throughout the day.  Protect yourself by wearing long sleeves, pants, a wide-brimmed hat, and sunglasses whenever you are outside.  Heart disease, diabetes, and high blood pressure  High blood pressure causes heart disease and increases the risk of stroke. High blood pressure is more likely to develop in: ? People who have blood pressure in the high end of the normal range (130-139/85-89 mm Hg). ? People who are overweight or obese. ? People who are African American.  If you are 70-28 years of age, have your blood pressure checked every 3-5 years. If you are 22 years of age or older, have your blood pressure checked every year. You should have your blood pressure measured twice-once when you are at a hospital or clinic, and once when you are not at a hospital or clinic. Record the average of the two measurements. To check your blood pressure when you are not at a hospital or clinic, you can use: ? An automated blood pressure  machine at a pharmacy. ? A home blood pressure monitor.  If you are between 28 years and 64 years old, ask your health care provider if you should take aspirin to prevent strokes.  Have regular diabetes screenings. This involves taking a blood sample to check your fasting blood sugar level. ? If you are at a normal weight and have a low risk for diabetes, have this test once every three years after 32 years of age. ? If you are overweight and have a high risk for diabetes, consider being tested at a younger age or more often. Preventing infection Hepatitis B  If you have a higher risk for hepatitis B, you should be screened for this virus. You are considered at high risk for hepatitis B if: ? You were born in a country where hepatitis B is common. Ask your health care provider which countries are considered high risk. ? Your parents were born in a high-risk country, and you have not been immunized against hepatitis B (hepatitis B vaccine). ? You have HIV or AIDS. ? You use needles to inject street drugs. ? You live with someone who has hepatitis B. ? You have had sex with someone who has hepatitis B. ? You get hemodialysis treatment. ? You take certain medicines for conditions, including cancer, organ transplantation, and autoimmune conditions.  Hepatitis C  Blood testing is recommended for: ? Everyone born from [birth year] through 1965. ? Anyone with known risk factors for hepatitis C.  Sexually transmitted infections (STIs)  You should be screened for sexually transmitted infections (STIs) including gonorrhea and chlamydia if: ? You are sexually active and are younger than 32 years of age. ? You are older than 32 years of age and your health care provider tells you that you are at risk for this type of infection. ? Your sexual activity has changed since you were last screened and you are at an increased risk for chlamydia or gonorrhea. Ask your health care provider if you are at  risk.  If you do not have HIV, but are at risk, it may be recommended that you take a prescription medicine daily to prevent HIV infection. This is called pre-exposure prophylaxis (PrEP). You are considered at risk if: ? You are sexually active and do not regularly use condoms or know the HIV status of your partner(s). ? You take drugs by injection. ? You are sexually active with a partner who has HIV.  Talk with  your health care provider about whether you are at high risk of being infected with HIV. If you choose to begin PrEP, you should first be tested for HIV. You should then be tested every 3 months for as long as you are taking PrEP. Pregnancy  If you are premenopausal and you may become pregnant, ask your health care provider about preconception counseling.  If you may become pregnant, take 400 to 800 micrograms (mcg) of folic acid every day.  If you want to prevent pregnancy, talk to your health care provider about birth control (contraception). Osteoporosis and menopause  Osteoporosis is a disease in which the bones lose minerals and strength with aging. This can result in serious bone fractures. Your risk for osteoporosis can be identified using a bone density scan.  If you are 34 years of age or older, or if you are at risk for osteoporosis and fractures, ask your health care provider if you should be screened.  Ask your health care provider whether you should take a calcium or vitamin D supplement to lower your risk for osteoporosis.  Menopause may have certain physical symptoms and risks.  Hormone replacement therapy may reduce some of these symptoms and risks. Talk to your health care provider about whether hormone replacement therapy is right for you. Follow these instructions at home:  Schedule regular health, dental, and eye exams.  Stay current with your immunizations.  Do not use any tobacco products including cigarettes, chewing tobacco, or electronic  cigarettes.  If you are pregnant, do not drink alcohol.  If you are breastfeeding, limit how much and how often you drink alcohol.  Limit alcohol intake to no more than 1 drink per day for nonpregnant women. One drink equals 12 ounces of beer, 5 ounces of wine, or 1 ounces of hard liquor.  Do not use street drugs.  Do not share needles.  Ask your health care provider for help if you need support or information about quitting drugs.  Tell your health care provider if you often feel depressed.  Tell your health care provider if you have ever been abused or do not feel safe at home. This information is not intended to replace advice given to you by your health care provider. Make sure you discuss any questions you have with your health care provider. Document Released: 04/17/2011 Document Revised: 03/09/2016 Document Reviewed: 07/06/2015 Elsevier Interactive Patient Education  [employer].

## 2017-10-19 LAB — LIPID PANEL
CHOLESTEROL TOTAL: 200 mg/dL — AB (ref 100–199)
Chol/HDL Ratio: 4.8 ratio — ABNORMAL HIGH (ref 0.0–4.4)
HDL: 42 mg/dL (ref >39)
LDL Calculated: 128 mg/dL — ABNORMAL HIGH (ref 0–99)
Triglycerides: 152 mg/dL — ABNORMAL HIGH (ref 0–149)
VLDL CHOLESTEROL CAL: 30 mg/dL (ref 5–40)

## 2017-10-19 LAB — TSH: TSH: 1.89 u[IU]/mL (ref 0.450–4.500)

## 2017-10-19 LAB — HEPATIC FUNCTION PANEL
ALT: 37 IU/L — ABNORMAL HIGH (ref 0–32)
AST: 22 IU/L (ref 0–40)
Albumin: 4.9 g/dL (ref 3.5–5.5)
Alkaline Phosphatase: 62 IU/L (ref 39–117)
BILIRUBIN TOTAL: 0.9 mg/dL (ref 0.0–1.2)
BILIRUBIN, DIRECT: 0.43 mg/dL — AB (ref 0.00–0.40)
TOTAL PROTEIN: 8.1 g/dL (ref 6.0–8.5)

## 2017-10-19 LAB — GLUCOSE, RANDOM: Glucose: 85 mg/dL (ref 65–99)

## 2017-10-19 LAB — HEMOGLOBIN A1C
ESTIMATED AVERAGE GLUCOSE: 111 mg/dL
Hgb A1c MFr Bld: 5.5 % (ref 4.8–5.6)

## 2017-11-19 ENCOUNTER — Encounter: Payer: Self-pay | Admitting: Podiatry

## 2017-11-19 ENCOUNTER — Ambulatory Visit (INDEPENDENT_AMBULATORY_CARE_PROVIDER_SITE_OTHER): Payer: BLUE CROSS/BLUE SHIELD | Admitting: Podiatry

## 2017-11-19 DIAGNOSIS — M7751 Other enthesopathy of right foot: Secondary | ICD-10-CM

## 2017-11-19 NOTE — Progress Notes (Signed)
She presents today for follow-up of her painful ankle right foot.  He states that it was a little bit better immediately following the injection but now it has worsened.  [place] plant with steel toed boots and it caused her ankle to swell.  She states that it was so swollen and so painful she could hardly walk.  She states that that was about 4 days ago.  She really does not know why it is swelled up but she assumed that is associated with those boots.  Objective: Vital signs are stable she is alert and oriented x3 she presents with a severe antalgic gait to the right lower extremity today.  Vital signs are stable pulses are palpable neurologic sensorium is intact deep tendon reflexes are intact muscle strength is normal.  She has severe pain on palpation of the anterolateral gutter of the right ankle.  There is a palpable mass of the anterior ankle possibly a hypertrophied tear of the anterior talofibular ligament or a fracture fragment or joint mouse.  There is crepitus on range of motion and pain.  Assessment: Cannot rule out a tear of the ATFL and osteochondral defect or other internal ankle derangement.  Plan: An MRI is indicated at this point due to chronic pain and inability to decrease this patient's symptomatology.

## 2017-11-20 ENCOUNTER — Encounter: Payer: Self-pay | Admitting: Family Medicine

## 2017-11-20 ENCOUNTER — Telehealth: Payer: Self-pay

## 2017-11-20 ENCOUNTER — Ambulatory Visit (INDEPENDENT_AMBULATORY_CARE_PROVIDER_SITE_OTHER): Payer: BLUE CROSS/BLUE SHIELD | Admitting: Family Medicine

## 2017-11-20 VITALS — BP 118/68 | HR 98 | Temp 97.7°F | Resp 16 | Wt 148.5 lb

## 2017-11-20 DIAGNOSIS — R05 Cough: Secondary | ICD-10-CM

## 2017-11-20 DIAGNOSIS — J029 Acute pharyngitis, unspecified: Secondary | ICD-10-CM | POA: Diagnosis not present

## 2017-11-20 DIAGNOSIS — R0982 Postnasal drip: Secondary | ICD-10-CM

## 2017-11-20 DIAGNOSIS — R059 Cough, unspecified: Secondary | ICD-10-CM

## 2017-11-20 LAB — POCT INFLUENZA A/B
Influenza A, POC: NEGATIVE
Influenza B, POC: NEGATIVE

## 2017-11-20 MED ORDER — ALBUTEROL SULFATE HFA 108 (90 BASE) MCG/ACT IN AERS: 2.0000 | INHALATION_SPRAY | Freq: Four times a day (QID) | RESPIRATORY_TRACT | 2 refills | Status: DC | PRN

## 2017-11-20 MED ORDER — BENZONATATE 100 MG PO CAPS: 100.0000 mg | ORAL_CAPSULE | Freq: Two times a day (BID) | ORAL | 0 refills | Status: DC | PRN

## 2017-11-20 MED ORDER — AMOXICILLIN-POT CLAVULANATE 875-125 MG PO TABS
1.0000 | ORAL_TABLET | Freq: Two times a day (BID) | ORAL | 0 refills | Status: DC
Start: 2017-11-20 — End: 2018-01-01

## 2017-11-20 NOTE — Telephone Encounter (Signed)
Per [employer] website, MRI has been approved from 11/20/17 to 12/19/17   Auth # [id number] Patient has been notified of approval and will call to schedule her own appt.

## 2017-11-20 NOTE — Progress Notes (Signed)
Name: Christy Farmer   MRN: [id number]    DOB: [birth year]   Date:11/20/2017       Progress Note  Subjective  Chief Complaint  Chief Complaint  Patient presents with  . chest congestion    onset friday; SOB when she takes deep breaths and some pain  . Cough    Dry lingering cough onset friday with mucus. sneezing, running nose, hot and cold chills, pt denies any fever  . Leg Pain    both legs onset saturday  . Headache    Pt states her head feel heavy   . Sore Throat    mild     Cough  This is a new problem. The current episode started in the past 7 days (5 days ago). The cough is productive of sputum. Associated symptoms include chills, nasal congestion, rhinorrhea and a sore throat (started with sore throat, then chills and coughing, now with runny nose.). Pertinent negatives include no ear pain or fever. She has tried OTC cough suppressant (Mucinex. ) for the symptoms.     Past Medical History:  Diagnosis Date  . Abnormal uterine bleeding (AUB)   . Allergy    seasonal allergies.  . Asthma   . Constipation   . Enlarged uterus   . Heavy periods   . Hepatitis A 09/16/2017  . Vaginal Pap smear, abnormal     Past Surgical History:  Procedure Laterality Date  . COLPOSCOPY      Family History  Problem Relation Age of Onset  . Hyperlipidemia Mother   . Hypertension Mother   . Healthy Father   . Healthy Brother   . Heart attack Maternal Grandmother   . Cancer Neg Hx   . Diabetes Neg Hx   . Heart disease Neg Hx     Social History   Socioeconomic History  . Marital status: Single    Spouse name: Not on file  . Number of children: Not on file  . Years of education: Not on file  . Highest education level: Not on file  Social Needs  . Financial resource strain: Not on file  . Food insecurity - worry: Not on file  . Food insecurity - inability: Not on file  . Transportation needs - medical: Not on file  . Transportation needs - non-medical: Not on file   Occupational History  . Not on file  Tobacco Use  . Smoking status: Never Smoker  . Smokeless tobacco: Never Used  Substance and Sexual Activity  . Alcohol use: No    Alcohol/week: 1.2 oz    Types: 2 Glasses of wine per week    Frequency: Never  . Drug use: No  . Sexual activity: Yes    Birth control/protection: IUD  Other Topics Concern  . Not on file  Social History Narrative  . Not on file     Current Outpatient Medications:  .  ketoconazole (NIZORAL) 2 % shampoo, SHAMPOO SCALP, FACE, CHEST 2-3 TIMES WEEKLY, LATHER & ALLOW TO SIT 3-5 MINUTES BEFORE RINSE, Disp: , Rfl: 5 .  levonorgestrel (MIRENA) 20 MCG/24HR IUD, 1 each by Intrauterine route once., Disp: , Rfl:   No Known Allergies   Review of Systems  Constitutional: Positive for chills. Negative for fever.  HENT: Positive for rhinorrhea and sore throat (started with sore throat, then chills and coughing, now with runny nose.). Negative for ear pain.   Respiratory: Positive for cough.       Objective  Vitals:  11/20/17 1155  BP: 118/68  Pulse: 98  Resp: 16  Temp: 97.7 F (36.5 C)  TempSrc: Oral  SpO2: 99%  Weight: 148 lb 8 oz (67.4 kg)    Physical Exam  Constitutional: She is oriented to person, place, and time and well-developed, well-nourished, and in no distress.  HENT:  Head: Normocephalic and atraumatic.  Right Ear: Tympanic membrane and ear canal normal.  Left Ear: Tympanic membrane and ear canal normal.  Mouth/Throat: Posterior oropharyngeal erythema present.  Rhinorrhea, turbinate hypertrophy. Post-nasal drainage visible.   Cardiovascular: Normal rate, regular rhythm, S1 normal, S2 normal and normal heart sounds.  No murmur heard. Pulmonary/Chest: Effort normal and breath sounds normal. She has no wheezes. She has no rhonchi.  Neurological: She is alert and oriented to person, place, and time.  Psychiatric: Mood, memory, affect and judgment normal.  Nursing note and vitals  reviewed.      Assessment & Plan  1. Sore throat Negative for influenza A and B - POCT Influenza A/B  2. Post-nasal drainage  - amoxicillin-clavulanate (AUGMENTIN) 875-125 MG tablet; Take 1 tablet by mouth 2 (two) times daily.  Dispense: 20 tablet; Refill: 0  3. Cough  - benzonatate (TESSALON) 100 MG capsule; Take 1 capsule (100 mg total) by mouth 2 (two) times daily as needed for cough.  Dispense: 20 capsule; Refill: 0   Jenie Parish Asad A. [facility] 11/20/2017 12:17 PM

## 2017-11-20 NOTE — Addendum Note (Signed)
Addended by: Geraldine ContrasVENABLE, Goran Olden D on: 11/20/2017 10:56 AM   Modules accepted: Orders

## 2017-11-20 NOTE — Telephone Encounter (Signed)
Prescription for Ventolin inhaler has been sent to patient's pharmacy

## 2017-11-20 NOTE — Telephone Encounter (Signed)
Patient ask if she can have an inhaler prescribed

## 2017-11-28 ENCOUNTER — Ambulatory Visit
Admission: RE | Admit: 2017-11-28 | Discharge: 2017-11-28 | Disposition: A | Discharge status: Home / Self Care | Payer: BLUE CROSS/BLUE SHIELD | Source: Ambulatory Visit | Attending: Podiatry | Admitting: Podiatry

## 2017-11-28 DIAGNOSIS — M76821 Posterior tibial tendinitis, right leg: Secondary | ICD-10-CM | POA: Insufficient documentation

## 2017-11-28 DIAGNOSIS — M7751 Other enthesopathy of right foot: Secondary | ICD-10-CM

## 2017-11-28 DIAGNOSIS — R600 Localized edema: Secondary | ICD-10-CM | POA: Diagnosis not present

## 2017-11-28 DIAGNOSIS — M899 Disorder of bone, unspecified: Secondary | ICD-10-CM | POA: Insufficient documentation

## 2017-11-28 DIAGNOSIS — R937 Abnormal findings on diagnostic imaging of other parts of musculoskeletal system: Secondary | ICD-10-CM | POA: Insufficient documentation

## 2017-11-29 ENCOUNTER — Telehealth: Payer: Self-pay | Admitting: *Deleted

## 2017-11-29 NOTE — Telephone Encounter (Signed)
I informed pt of Dr. Geryl RankinsHyatt's review of results and orders to send for over read by radiology specialist for more indepth information to plan treatment. [facility]

## 2017-11-29 NOTE — Telephone Encounter (Signed)
-----   Message from Elinor ParkinsonMax T Hyatt, [place] sent at 11/29/2017  7:40 AM EST ----- Send for an over read

## 2017-12-05 NOTE — Telephone Encounter (Signed)
Mailed copy of MRI disc to [facility]. 

## 2017-12-18 ENCOUNTER — Encounter: Payer: Self-pay | Admitting: Podiatry

## 2017-12-25 ENCOUNTER — Telehealth: Payer: Self-pay | Admitting: *Deleted

## 2017-12-25 NOTE — Telephone Encounter (Signed)
-----   Message from Elinor ParkinsonMax T Hyatt, [place] sent at 12/25/2017  7:27 AM EDT ----- Have her in for discussion with DR. EVANS.  Kipp BroodBrent, it looks like a arthroscopy may be best for her.

## 2017-12-25 NOTE — Telephone Encounter (Signed)
Left message for pt to call for results of the MRI.

## 2017-12-31 DIAGNOSIS — L821 Other seborrheic keratosis: Secondary | ICD-10-CM | POA: Diagnosis not present

## 2017-12-31 DIAGNOSIS — Z72820 Sleep deprivation: Secondary | ICD-10-CM | POA: Diagnosis not present

## 2017-12-31 DIAGNOSIS — L700 Acne vulgaris: Secondary | ICD-10-CM | POA: Diagnosis not present

## 2017-12-31 DIAGNOSIS — M791 Myalgia, unspecified site: Secondary | ICD-10-CM | POA: Diagnosis not present

## 2017-12-31 DIAGNOSIS — M5430 Sciatica, unspecified side: Secondary | ICD-10-CM | POA: Diagnosis not present

## 2017-12-31 DIAGNOSIS — M9905 Segmental and somatic dysfunction of pelvic region: Secondary | ICD-10-CM | POA: Diagnosis not present

## 2017-12-31 DIAGNOSIS — L219 Seborrheic dermatitis, unspecified: Secondary | ICD-10-CM | POA: Diagnosis not present

## 2018-01-01 ENCOUNTER — Ambulatory Visit (INDEPENDENT_AMBULATORY_CARE_PROVIDER_SITE_OTHER): Payer: BLUE CROSS/BLUE SHIELD | Admitting: Family Medicine

## 2018-01-01 ENCOUNTER — Encounter: Payer: Self-pay | Admitting: Family Medicine

## 2018-01-01 VITALS — BP 110/60 | HR 100 | Temp 98.3°F | Resp 18 | Ht 64.0 in | Wt 150.2 lb

## 2018-01-01 DIAGNOSIS — R51 Headache: Secondary | ICD-10-CM

## 2018-01-01 DIAGNOSIS — H02402 Unspecified ptosis of left eyelid: Secondary | ICD-10-CM | POA: Diagnosis not present

## 2018-01-01 DIAGNOSIS — R519 Headache, unspecified: Secondary | ICD-10-CM

## 2018-01-01 MED ORDER — TRIAMCINOLONE ACETONIDE 40 MG/ML IJ SUSP
40.0000 mg | Freq: Once | INTRAMUSCULAR | Status: AC
  Administered 2018-01-01: 40 mg via INTRAMUSCULAR

## 2018-01-01 MED ORDER — PREDNISONE 20 MG PO TABS
20.0000 mg | ORAL_TABLET | Freq: Two times a day (BID) | ORAL | 0 refills | Status: AC
Start: 2018-01-01 — End: 2018-01-03

## 2018-01-01 MED ORDER — KETOROLAC TROMETHAMINE 60 MG/2ML IM SOLN
60.0000 mg | Freq: Once | INTRAMUSCULAR | Status: AC
Start: 2018-01-01 — End: 2018-01-01
  Administered 2018-01-01: 60 mg via INTRAMUSCULAR

## 2018-01-01 MED ORDER — PROMETHAZINE HCL 25 MG/ML IJ SOLN
25.0000 mg | Freq: Four times a day (QID) | INTRAMUSCULAR | Status: DC | PRN
  Administered 2018-01-01: 25 mg via INTRAVENOUS

## 2018-01-01 NOTE — Progress Notes (Signed)
[name]   MRN: [id number]    DOB: Dec 31, 1985   Date:01/01/2018       Progress Note  Subjective  Chief Complaint  Chief Complaint  Patient presents with  . Headache    for 3 days, constant, more on front left with pain radiating down her eye    HPI Pt presents with new onset headache - it is mostly on the LEFT side surrounding the eye and radiating into the left posterior head.  She reports feeling like her LEFT eye is "lazy" - denies vision changes - no blurred or double vision, no nasal congestion, ear pain/pressure, sore throat, recent illness, rashes, photosensitivity, phono-sensitivity, nausea, vomiting.  No history of migraines or similar headache.  Has been taking tylenol and ibuprofen without relief of pain. No pain in temples, no pain with chewing.  Patient Active Problem List   Diagnosis Date Noted  . Viral hepatitis A without hepatic coma 10/18/2017  . Jaundice 09/16/2017  . Well woman exam without gynecological exam 11/17/2016  . Contact dermatitis 04/28/2016  . Skin rash 04/24/2016  . Annual physical exam 12/01/2015  . Sore throat 12/01/2015  . Viral pharyngitis 08/18/2015  . Encounter to establish care with new doctor 08/13/2015  . Asthma 08/13/2015  . History of abnormal cervical Pap smear 06/15/2015  . Enlarged uterus 06/15/2015  . Menorrhagia 06/15/2015  . Dysmenorrhea 06/15/2015    Social History   Tobacco Use  . Smoking status: Never Smoker  . Smokeless tobacco: Never Used  Substance Use Topics  . Alcohol use: No    Alcohol/week: 1.2 oz    Types: 2 Glasses of wine per week    Frequency: Never     Current Outpatient Medications:  .  levonorgestrel (MIRENA) 20 MCG/24HR IUD, 1 each by Intrauterine route once., Disp: , Rfl:  .  ketoconazole (NIZORAL) 2 % shampoo, SHAMPOO SCALP, FACE, CHEST 2-3 TIMES WEEKLY, LATHER & ALLOW TO SIT 3-5 MINUTES BEFORE RINSE, Disp: , Rfl: 5 .  predniSONE (DELTASONE) 20 MG tablet, Take 1 tablet (20 mg  total) by mouth 2 (two) times daily with a meal for 2 days., Disp: 3 tablet, Rfl: 0  Current Facility-Administered Medications:  .  ketorolac (TORADOL) injection 60 mg, 60 mg, Intramuscular, Once, Maurice Small E, FNP .  triamcinolone acetonide (KENALOG-40) injection 40 mg, 40 mg, Intramuscular, Once, Maurice Small E, FNP  No Known Allergies  ROS Constitutional: Negative for fever or weight change.  Respiratory: Negative for cough and shortness of breath.   Cardiovascular: Negative for chest pain or palpitations.  Gastrointestinal: Negative for abdominal pain, no bowel changes.  Musculoskeletal: Negative for gait problem or joint swelling.  Skin: Negative for rash.  Neurological: Negative for dizziness; see HPI No other specific complaints in a complete review of systems (except as listed in HPI above).  Objective  Vitals:   01/01/18 1408  BP: 110/60  Pulse: 100  Resp: 18  Temp: 98.3 F (36.8 C)  TempSrc: Oral  SpO2: 97%  Weight: 150 lb 3.2 oz (68.1 kg)  Height: 5\' 4"  (1.626 m)    Body mass index is 25.78 kg/m.  Nursing Note and Vital Signs reviewed.  Physical Exam  HENT:  Head:      Constitutional: Patient appears well-developed and well-nourished. Obese. No distress.  HEENT: head atraumatic, normocephalic, pupils equal and reactive to light, EOM's intact, very slight ptosis noted to LEFT eye only, no temporal tenderness. TM's without erythema or bulging, no maxillary or frontal sinus  tenderness, neck supple without lymphadenopathy, oropharynx pink and moist without exudate Cardiovascular: Normal rate, regular rhythm, S1/S2 present.  No murmur or rub heard. No BLE edema. Pulmonary/Chest: Effort normal and breath sounds clear. No respiratory distress or retractions. Psychiatric: Patient has a normal mood and affect. behavior is normal. Judgment and thought content normal. Musculoskeletal: Normal range of motion, no joint effusions. No gross deformities Neurological:  she is alert and oriented to person, place, and time. No cranial nerve deficit. Coordination, balance, strength, speech and gait are normal.  Skin: Skin is warm and dry. No rash noted. No erythema.    No results found for this or any previous visit (from the past 72 hour(s)).  Assessment & Plan  1. Left-sided headache - Advised headache is atypical with unclear etiology, follow up in 2 days if not improving and we will consider imaging/referral. - Offered promethazine IM - husband is able to come and pick her up, so we will administer. - triamcinolone acetonide (KENALOG-40) injection 40 mg - ketorolac (TORADOL) injection 60 mg - predniSONE (DELTASONE) 20 MG tablet; Take 1 tablet (20 mg total) by mouth 2 (two) times daily with a meal for 2 days.  Dispense: 3 tablet; Refill: 0 - Advised to take prednisone per AVS instructions - promethazine (PHENERGAN) injection 25 mg  2. Ptosis of eyelid, left - Will monitor, if not improving in 2 days will RTC.  -Red flags and when to present for emergency care or RTC including fever >101.50F, chest pain, shortness of breath, new/worsening/un-resolving symptoms, vision changes, periorbital swelling, pain with ocular movement, reviewed with patient at time of visit. Follow up and care instructions discussed and provided in AVS.

## 2018-01-01 NOTE — Patient Instructions (Addendum)
-   Take 1 tablet of prednisone tonight around 8:00pm - Then take 1 tablet tomorrow morning and 1 tablet tomorrow afternoon.

## 2018-01-02 ENCOUNTER — Ambulatory Visit: Payer: BLUE CROSS/BLUE SHIELD | Admitting: Podiatry

## 2018-01-08 ENCOUNTER — Ambulatory Visit (INDEPENDENT_AMBULATORY_CARE_PROVIDER_SITE_OTHER): Payer: BLUE CROSS/BLUE SHIELD | Admitting: Podiatry

## 2018-01-08 ENCOUNTER — Encounter: Payer: Self-pay | Admitting: Podiatry

## 2018-01-08 ENCOUNTER — Ambulatory Visit: Payer: BLUE CROSS/BLUE SHIELD | Admitting: Podiatry

## 2018-01-08 DIAGNOSIS — M949 Disorder of cartilage, unspecified: Secondary | ICD-10-CM | POA: Diagnosis not present

## 2018-01-08 DIAGNOSIS — M899 Disorder of bone, unspecified: Secondary | ICD-10-CM | POA: Diagnosis not present

## 2018-01-08 DIAGNOSIS — M659 Synovitis and tenosynovitis, unspecified: Secondary | ICD-10-CM

## 2018-01-08 NOTE — Patient Instructions (Signed)
Pre-Operative Instructions  Congratulations, you have decided to take an important step towards improving your quality of life.  You can be assured that the doctors and staff at [facility] will be with you every step of the way.  Here are some important things you should know:  1. Plan to be at the surgery center/hospital at least 1 (one) hour prior to your scheduled time, unless otherwise directed by the surgical center/hospital staff.  You must have a responsible adult accompany you, remain during the surgery and drive you home.  Make sure you have directions to the surgical center/hospital to ensure you arrive on time. 2. If you are having surgery at [facility] or [facility] hospitals, you will need a copy of your medical history and physical form from your family physician within one month prior to the date of surgery. We will give you a form for your primary physician to complete.  3. We make every effort to accommodate the date you request for surgery.  However, there are times where surgery dates or times have to be moved.  We will contact you as soon as possible if a change in schedule is required.   4. No aspirin/ibuprofen for one week before surgery.  If you are on aspirin, any non-steroidal anti-inflammatory medications (Mobic, Aleve, Ibuprofen) should not be taken seven (7) days prior to your surgery.  You make take Tylenol for pain prior to surgery.  5. Medications - If you are taking daily heart and blood pressure medications, seizure, reflux, allergy, asthma, anxiety, pain or diabetes medications, make sure you notify the surgery center/hospital before the day of surgery so they can tell you which medications you should take or avoid the day of surgery. 6. No food or drink after midnight the night before surgery unless directed otherwise by surgical center/hospital staff. 7. No alcoholic beverages 24-hours prior to surgery.  No smoking 24-hours prior or 24-hours after  surgery. 8. Wear loose pants or shorts. They should be loose enough to fit over bandages, boots, and casts. 9. Don't wear slip-on shoes. Sneakers are preferred. 10. Bring your boot with you to the surgery center/hospital.  Also bring crutches or a walker if your physician has prescribed it for you.  If you do not have this equipment, it will be provided for you after surgery. 11. If you have not been contacted by the surgery center/hospital by the day before your surgery, call to confirm the date and time of your surgery. 12. Leave-time from work may vary depending on the type of surgery you have.  Appropriate arrangements should be made prior to surgery with your employer. 13. Prescriptions will be provided immediately following surgery by your doctor.  Fill these as soon as possible after surgery and take the medication as directed. Pain medications will not be refilled on weekends and must be approved by the doctor. 14. Remove nail polish on the operative foot and avoid getting pedicures prior to surgery. 15. Wash the night before surgery.  The night before surgery wash the foot and leg well with water and the antibacterial soap provided. Be sure to pay special attention to beneath the toenails and in between the toes.  Wash for at least three (3) minutes. Rinse thoroughly with water and dry well with a towel.  Perform this wash unless told not to do so by your physician.  Enclosed: 1 Ice pack (please put in freezer the night before surgery)   1 Hibiclens skin cleaner     Pre-op instructions  If you have any questions regarding the instructions, please do not hesitate to call our office.  [place]: [address], [place], [place] [address] -- [phone]  [place]: [address] [place], [place] [address] -- [phone]  [place]: [address]  [place], [place] [address] -- [phone]  [place]: [address], [address], [place],  [address] -- [phone]  Website:  [email or web address] 

## 2018-01-09 ENCOUNTER — Telehealth: Payer: Self-pay | Admitting: *Deleted

## 2018-01-09 NOTE — Progress Notes (Signed)
   HPI: 32 year old female otherwise healthy presents the office today for follow-up evaluation and treatment regarding chronic right ankle pain going on for greater than 6 months.  Patient states that she was wearing heels and she twisted her ankle at the time of initial injury greater than 6 months ago.  She is experienced chronic pain and tenderness ever since.  She is been treated by Dr. Al CorpusHyatt who finally ordered an MRI due to the nonprogressive nature of the patient's symptoms.  MRI was ordered on 11/19/2017.  Patient presents today to review the MRI results and discuss further treatment options.  Past Medical History:  Diagnosis Date  . Abnormal uterine bleeding (AUB)   . Allergy    seasonal allergies.  . Asthma   . Constipation   . Enlarged uterus   . Heavy periods   . Hepatitis A 09/16/2017  . Vaginal Pap smear, abnormal      Physical Exam: General: The patient is alert and oriented x3 in no acute distress.  Dermatology: Skin is warm, dry and supple bilateral lower extremities. Negative for open lesions or macerations.  Vascular: Palpable pedal pulses bilaterally. No edema or erythema noted. Capillary refill within normal limits.  Neurological: Epicritic and protective threshold grossly intact bilaterally.   Musculoskeletal Exam: Range of motion within normal limits to all pedal and ankle joints bilateral. Muscle strength 5/5 in all groups bilateral.   MRI Impression:  1. Osteochondral lesion of the posteromedial aspect of the dome of the talus. 2. Focal tenosynovitis of the posterior tibialis tendon. 3. Focal degeneration or inflammation of the deltoid ligament.  The  Assessment: -  OCD lesion 10mm posteromedial talar dome -Chronic right ankle synovitis -Focal edema deltoid ligament complex and posterior tibial tendon sheath   Plan of Care:  - Patient evaluated.  MRI reviewed today with the patient.  -Today we discussed the different conservative and surgical  management modalities that are available to the patient.  Given the fact that the patient has had no improvement of symptoms for greater than 6 months surgery is likely warranted. - Today we discussed the conservative versus surgical management of the presenting pathology. The patient opts for surgical management. All possible complications and details of the procedure were explained. All patient questions were answered. No guarantees were expressed or implied. - Authorization for surgery was initiated today. Surgery will consist of ankle arthroscopic synovectomy right.  Microfracture and subchondral drilling of the posterior medial osteochondral lesion talus right. -Return to clinic 1 week postop    Felecia ShellingBrent M. Alicia Seib, DPM [facility]  Dr. Felecia ShellingBrent M. Yarenis Cerino, DPM    [address]                                        [place], [place] [address]                Office [phone](336) [phone]  Fax [phone]) [phone]

## 2018-01-09 NOTE — Telephone Encounter (Signed)
"  I was told to call you to schedule my surgery."  Have you signed consent forms?  "Yes, I signed them yesterday."  Do you have a date in mind that you would like to schedule your surgery?  "Yes, I'd like to do it the week of April 29."  Dr. Logan BoresEvans can do it on May 2.  "Perfect, that date will be fine.  Is there anything else I need to do?  Do I just show up there?"  You need to register with the surgical center, instructions are in the brochure that was given to you.  Someone from the surgical center will call you with the arrival time a day or two prior to your appointment.  "Okay great, thank you so much Payam Gribble."  (I need scheduling information.)

## 2018-01-09 NOTE — Telephone Encounter (Signed)
Dr. Logan BoresEvans has this patients consent forms

## 2018-01-30 ENCOUNTER — Telehealth: Payer: Self-pay | Admitting: Podiatry

## 2018-01-30 NOTE — Telephone Encounter (Signed)
Pt. Has not received medication that you were suppose to send to ([facility] [address] [facility] [place]).

## 2018-01-30 NOTE — Telephone Encounter (Signed)
Patient stated at last visit you were going to send in a pain medication for her.  Please advise on what medication so I can send it in    Thanks!

## 2018-01-31 MED ORDER — TRAMADOL HCL 50 MG PO TABS: 50.0000 mg | ORAL_TABLET | Freq: Four times a day (QID) | ORAL | 0 refills | Status: DC | PRN

## 2018-01-31 NOTE — Telephone Encounter (Deleted)
Ok for Tramadol #30 no rf

## 2018-01-31 NOTE — Addendum Note (Signed)
Addended by: Geraldine ContrasVENABLE, Kalliope Riesen D on: 01/31/2018 05:00 PM   Modules accepted: Orders

## 2018-01-31 NOTE — Telephone Encounter (Signed)
Patient has called numerous of times to receive pain medication. She states that she is in a lot of pain and needs the medication before the end of the day. Please give her a call.

## 2018-01-31 NOTE — Telephone Encounter (Signed)
Opened in error

## 2018-01-31 NOTE — Telephone Encounter (Signed)
Per Dr. Logan BoresEvans verbal approval, we will call in Tramadol #30 0rf  Patient is aware of medication and she will pick up at pharmacy this evening.

## 2018-02-14 ENCOUNTER — Encounter: Payer: Self-pay | Admitting: Podiatry

## 2018-02-14 DIAGNOSIS — M24174 Other articular cartilage disorders, right foot: Secondary | ICD-10-CM | POA: Diagnosis not present

## 2018-02-14 DIAGNOSIS — M65871 Other synovitis and tenosynovitis, right ankle and foot: Secondary | ICD-10-CM | POA: Diagnosis not present

## 2018-02-14 DIAGNOSIS — M93271 Osteochondritis dissecans, right ankle and joints of right foot: Secondary | ICD-10-CM | POA: Diagnosis not present

## 2018-02-14 DIAGNOSIS — M958 Other specified acquired deformities of musculoskeletal system: Secondary | ICD-10-CM | POA: Diagnosis not present

## 2018-02-14 DIAGNOSIS — M25571 Pain in right ankle and joints of right foot: Secondary | ICD-10-CM | POA: Diagnosis not present

## 2018-02-14 DIAGNOSIS — B159 Hepatitis A without hepatic coma: Secondary | ICD-10-CM | POA: Diagnosis not present

## 2018-02-18 ENCOUNTER — Encounter: Payer: Self-pay | Admitting: Podiatry

## 2018-02-19 ENCOUNTER — Ambulatory Visit (INDEPENDENT_AMBULATORY_CARE_PROVIDER_SITE_OTHER): Payer: BLUE CROSS/BLUE SHIELD | Admitting: Podiatry

## 2018-02-19 ENCOUNTER — Ambulatory Visit (INDEPENDENT_AMBULATORY_CARE_PROVIDER_SITE_OTHER): Payer: BLUE CROSS/BLUE SHIELD

## 2018-02-19 ENCOUNTER — Encounter: Payer: Self-pay | Admitting: Podiatry

## 2018-02-19 VITALS — BP 122/78 | HR 81 | Temp 98.2°F

## 2018-02-19 DIAGNOSIS — M899 Disorder of bone, unspecified: Secondary | ICD-10-CM

## 2018-02-19 DIAGNOSIS — M949 Disorder of cartilage, unspecified: Secondary | ICD-10-CM

## 2018-02-19 DIAGNOSIS — M659 Synovitis and tenosynovitis, unspecified: Secondary | ICD-10-CM | POA: Diagnosis not present

## 2018-02-19 DIAGNOSIS — Z9889 Other specified postprocedural states: Secondary | ICD-10-CM

## 2018-02-21 NOTE — Progress Notes (Signed)
   Subjective:  Patient presents today status post ankle arthroscopy and osteochondral drilling right. DOS: 02/14/18. She reports pain to the lateral and medial aspects of the ankle as well as to the plantar aspect of the heel. She states she fell two days ago and heard a popping sound. She has been weightbearing in the CAM boot as directed. Patient is here for further evaluation and treatment.   Past Medical History:  Diagnosis Date  . Abnormal uterine bleeding (AUB)   . Allergy    seasonal allergies.  . Asthma   . Constipation   . Enlarged uterus   . Heavy periods   . Hepatitis A 09/16/2017  . Vaginal Pap smear, abnormal       Objective/Physical Exam Neurovascular status intact.  Skin incisions appear to be well coapted with sutures and staples intact. No sign of infectious process noted. No dehiscence. No active bleeding noted. Moderate edema noted to the surgical extremity.  Radiographic Exam:  Orthopedic hardware and osteotomies sites appear to be stable with routine healing.  Assessment: 1. s/p ankle arthroscopy and osteochondral drilling right. DOS: 02/14/18   Plan of Care:  1. Patient was evaluated. X-rays reviewed 2. Dressing changed.  3. Continue weightbearing in CAM boot.  4. Return to clinic in one week for suture removal.    Felecia Shelling, DPM [facility]  Dr. Felecia Shelling, DPM    [address]                                        [place], [place] [address]                Office [phone]  Fax [phone]

## 2018-02-22 ENCOUNTER — Encounter: Payer: BLUE CROSS/BLUE SHIELD | Admitting: Podiatry

## 2018-03-01 ENCOUNTER — Encounter: Payer: Self-pay | Admitting: Podiatry

## 2018-03-01 ENCOUNTER — Ambulatory Visit (INDEPENDENT_AMBULATORY_CARE_PROVIDER_SITE_OTHER): Payer: BLUE CROSS/BLUE SHIELD | Admitting: Podiatry

## 2018-03-01 DIAGNOSIS — Z9889 Other specified postprocedural states: Secondary | ICD-10-CM

## 2018-03-01 DIAGNOSIS — M949 Disorder of cartilage, unspecified: Secondary | ICD-10-CM

## 2018-03-01 DIAGNOSIS — M899 Disorder of bone, unspecified: Secondary | ICD-10-CM

## 2018-03-01 NOTE — Progress Notes (Signed)
   Subjective:  Patient presents today status post ankle arthroscopy and osteochondral drilling right. DOS: 02/14/18. She states she is doing better but reports some soreness in the posterior heel. Patient is here for further evaluation and treatment.   Past Medical History:  Diagnosis Date  . Abnormal uterine bleeding (AUB)   . Allergy    seasonal allergies.  . Asthma   . Constipation   . Enlarged uterus   . Heavy periods   . Hepatitis A 09/16/2017  . Vaginal Pap smear, abnormal       Objective/Physical Exam Neurovascular status intact.  Skin incisions appear to be well coapted with sutures and staples intact. No sign of infectious process noted. No dehiscence. No active bleeding noted. Moderate edema noted to the surgical extremity.   Assessment: 1. s/p ankle arthroscopy and osteochondral drilling right. DOS: 02/14/18   Plan of Care:  1. Patient was evaluated.  2. Sutures removed.  3. Order for physical therapy placed for three times weekly for four weeks.  4. Return to clinic in 4 weeks.     Felecia Shelling, DPM [facility]  Dr. Felecia Shelling, DPM    [address]                                        [place], [place] [address]                Office [phone]  Fax [phone]

## 2018-03-06 DIAGNOSIS — M25572 Pain in left ankle and joints of left foot: Secondary | ICD-10-CM | POA: Diagnosis not present

## 2018-03-08 DIAGNOSIS — M25572 Pain in left ankle and joints of left foot: Secondary | ICD-10-CM | POA: Diagnosis not present

## 2018-03-11 DIAGNOSIS — M25572 Pain in left ankle and joints of left foot: Secondary | ICD-10-CM | POA: Diagnosis not present

## 2018-03-13 DIAGNOSIS — M25572 Pain in left ankle and joints of left foot: Secondary | ICD-10-CM | POA: Diagnosis not present

## 2018-03-15 DIAGNOSIS — M25572 Pain in left ankle and joints of left foot: Secondary | ICD-10-CM | POA: Diagnosis not present

## 2018-03-18 DIAGNOSIS — M25572 Pain in left ankle and joints of left foot: Secondary | ICD-10-CM | POA: Diagnosis not present

## 2018-03-22 DIAGNOSIS — M25572 Pain in left ankle and joints of left foot: Secondary | ICD-10-CM | POA: Diagnosis not present

## 2018-03-29 DIAGNOSIS — M25572 Pain in left ankle and joints of left foot: Secondary | ICD-10-CM | POA: Diagnosis not present

## 2018-04-01 ENCOUNTER — Ambulatory Visit (INDEPENDENT_AMBULATORY_CARE_PROVIDER_SITE_OTHER): Payer: BLUE CROSS/BLUE SHIELD | Admitting: Podiatry

## 2018-04-01 ENCOUNTER — Encounter: Payer: Self-pay | Admitting: Podiatry

## 2018-04-01 DIAGNOSIS — M899 Disorder of bone, unspecified: Secondary | ICD-10-CM

## 2018-04-01 DIAGNOSIS — Z9889 Other specified postprocedural states: Secondary | ICD-10-CM

## 2018-04-01 DIAGNOSIS — M949 Disorder of cartilage, unspecified: Secondary | ICD-10-CM

## 2018-04-02 ENCOUNTER — Encounter: Payer: BLUE CROSS/BLUE SHIELD | Admitting: Podiatry

## 2018-04-07 NOTE — Progress Notes (Signed)
   Subjective:  Patient presents today status post ankle arthroscopy and osteochondral drilling right. DOS: 02/14/18. She reports an improvement in the pain but not complete resolution. She has been wearing the compression anklet for treatment. She denies modifying factors. Patient is here for further evaluation and treatment.   Past Medical History:  Diagnosis Date  . Abnormal uterine bleeding (AUB)   . Allergy    seasonal allergies.  . Asthma   . Constipation   . Enlarged uterus   . Heavy periods   . Hepatitis A 09/16/2017  . Vaginal Pap smear, abnormal       Objective/Physical Exam Neurovascular status intact.  Skin incisions appear to be well coapted. No sign of infectious process noted. No dehiscence. No active bleeding noted. Moderate edema noted to the surgical extremity.  Radiographic Exam:  Orthopedic hardware and osteotomies sites appear to be stable with routine healing.   Assessment: 1. s/p ankle arthroscopy and osteochondral drilling right. DOS: 02/14/18   Plan of Care:  1. Patient was evaluated. X-Rays reviewed.  2. Slowly resume full activity.  3. Continue wearing good sneakers x one month.  4. Continue wearing compression anklet.  5. Return to clinic in 3 months.      Felecia ShellingBrent M. Sharry Beining, DPM [facility]  Dr. Felecia ShellingBrent M. Jarrid Lienhard, DPM    [address]                                        [place], [place] [address]                Office [phone](336) [phone]  Fax [phone]) [phone]

## 2018-04-12 ENCOUNTER — Encounter: Payer: BLUE CROSS/BLUE SHIELD | Admitting: Podiatry

## 2018-04-12 ENCOUNTER — Encounter

## 2018-04-30 IMAGING — US US ABDOMEN LIMITED
1 series · 14 of 25 positions shown · non-contrast
Comparison: None.

CLINICAL DATA: Abdominal pain for [REDACTED] weeks with nausea and
vomiting.

EXAM:
ULTRASOUND ABDOMEN LIMITED RIGHT UPPER QUADRANT

[Series 1: us abdomen limited · 0.20mm/px · 14 of 58 slices shown]
[im 1/58]
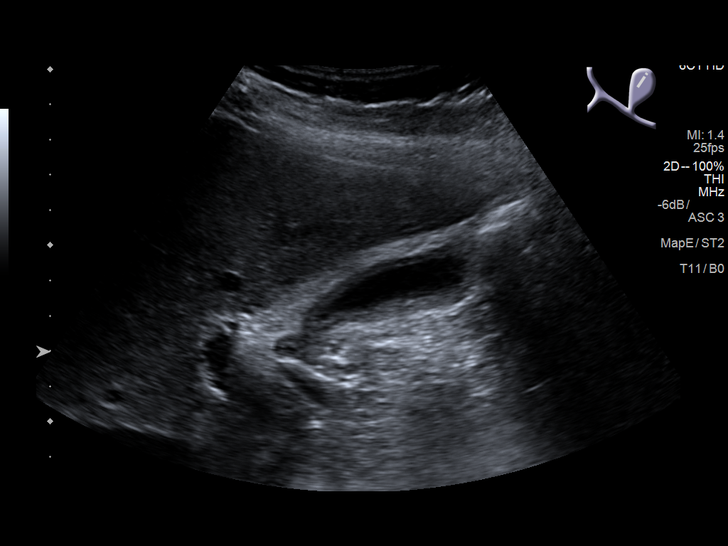
[im 5/58]
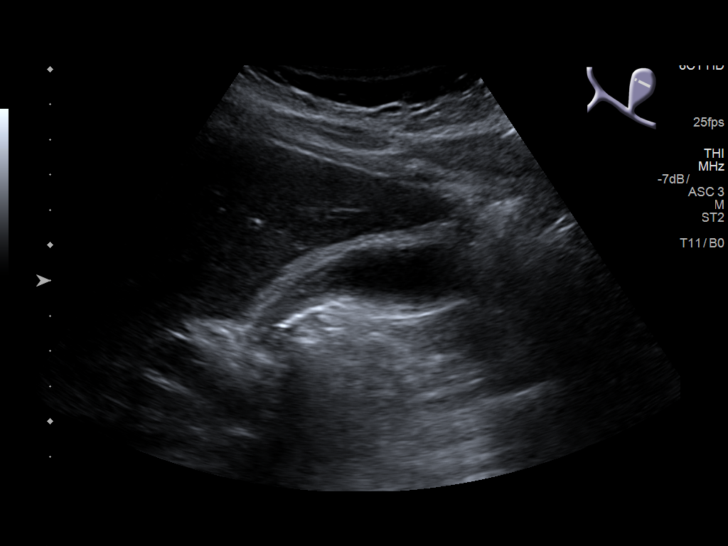
[im 10/58]
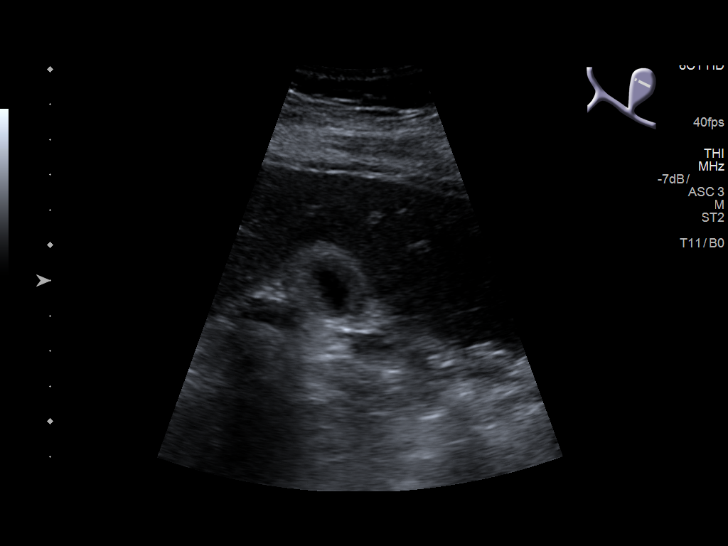
[im 15/58]
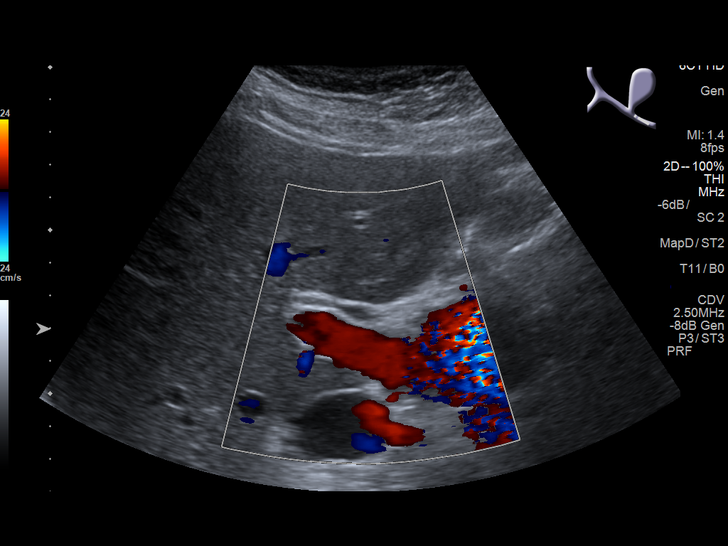
[im 20/58]
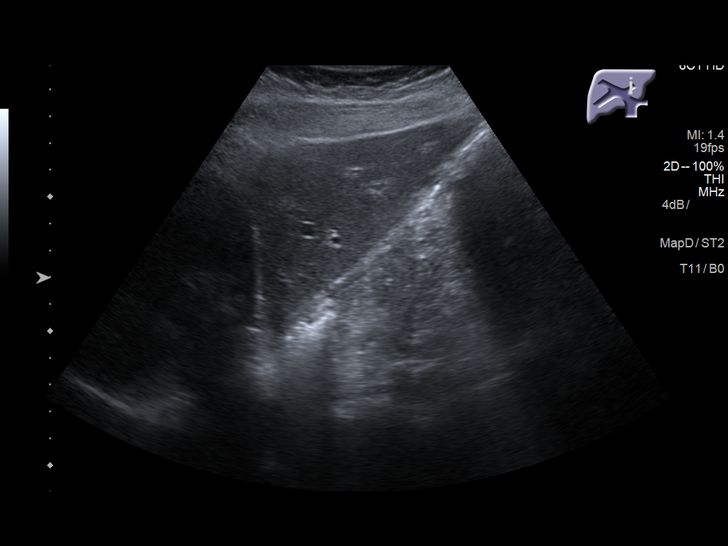
[im 22/58]
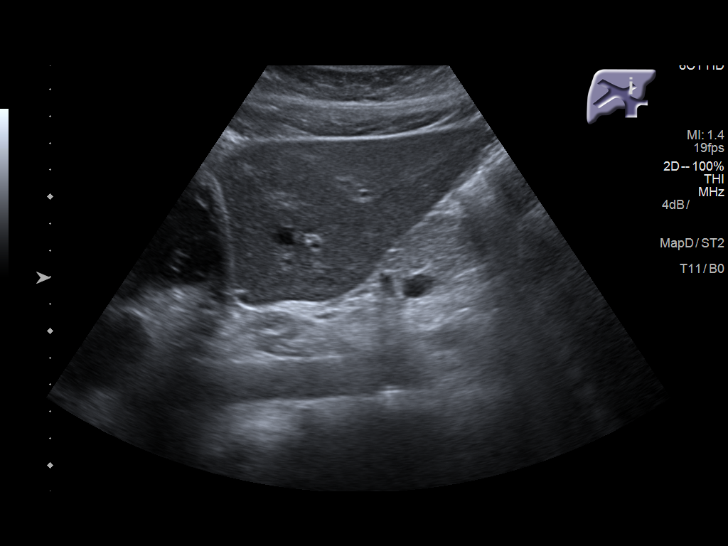
[im 27/58]
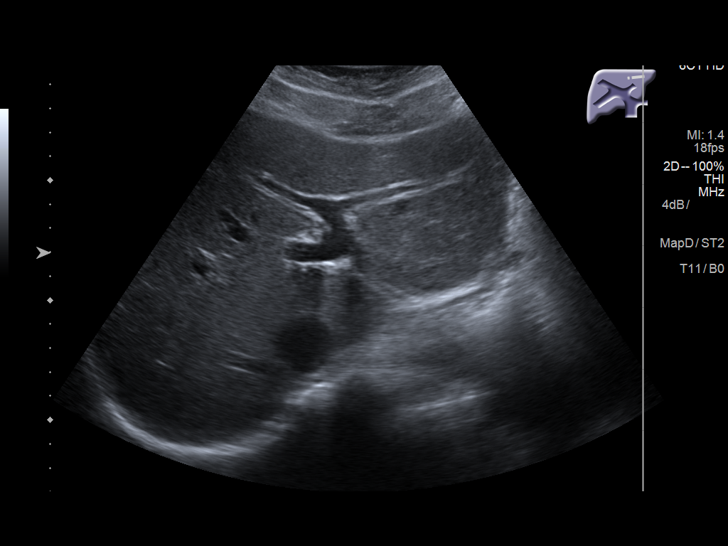
[im 31/58]
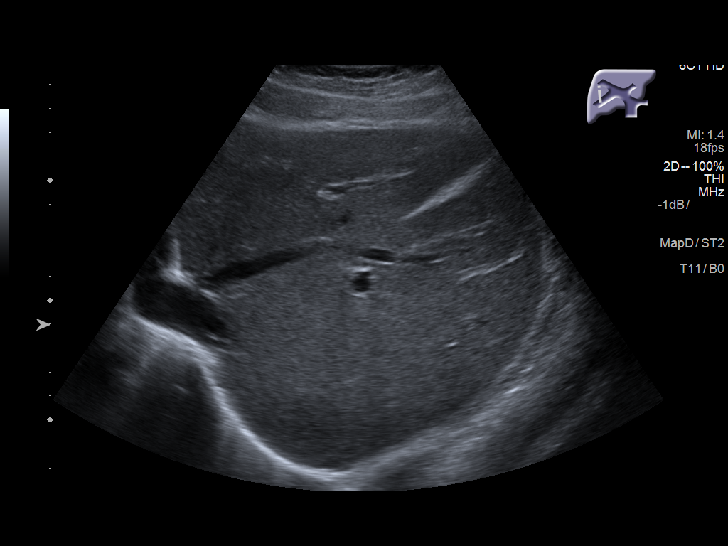
[im 36/58]
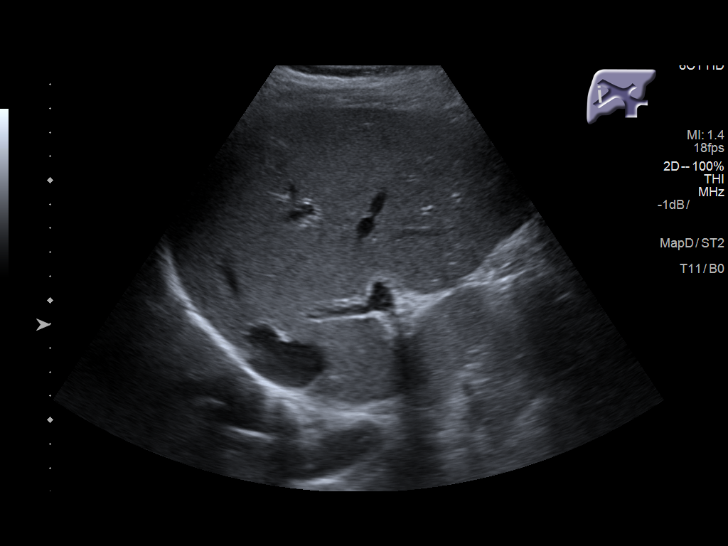
[im 39/58]
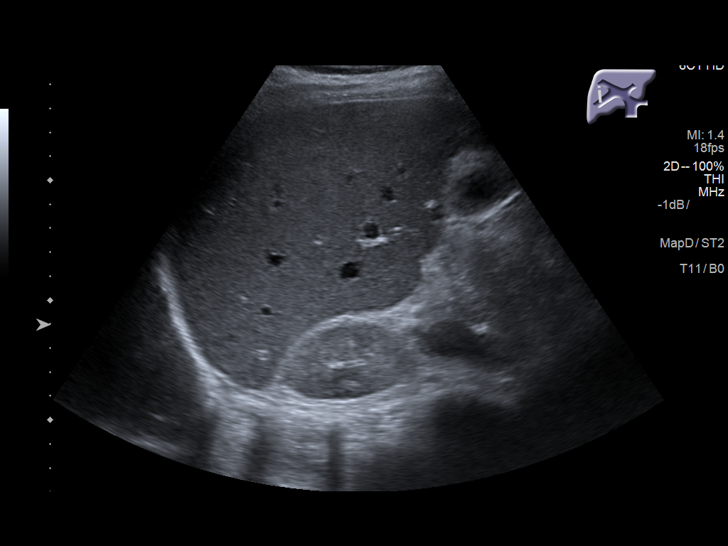
[im 43/58]
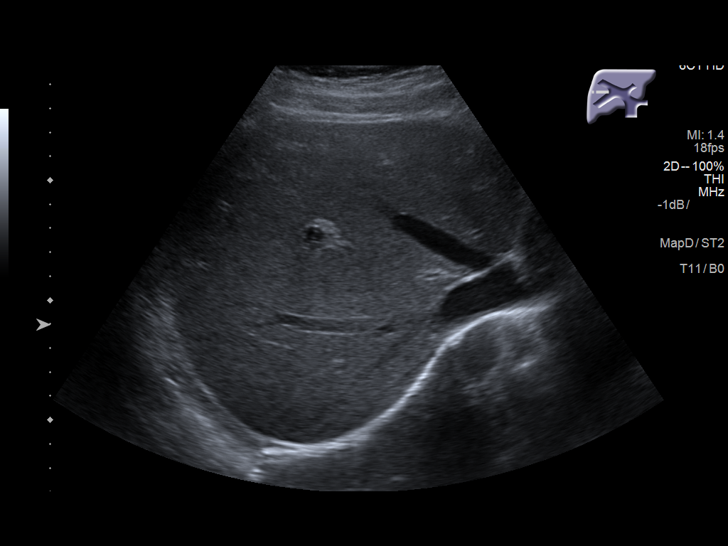
[im 48/58]
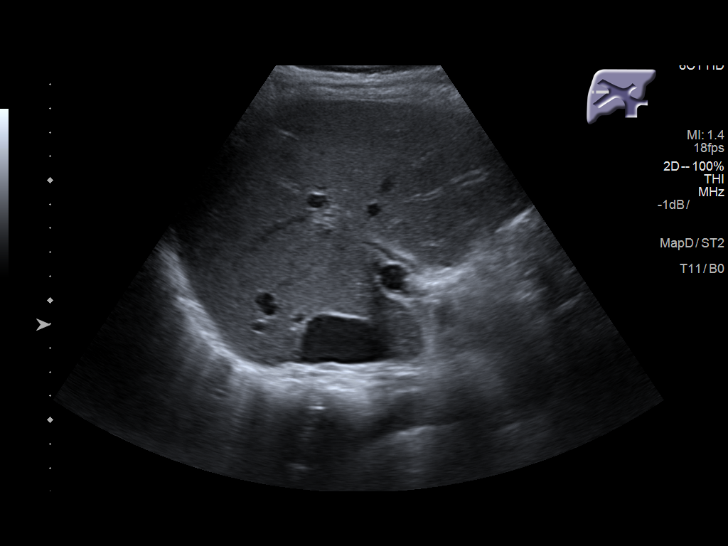
[im 53/58]
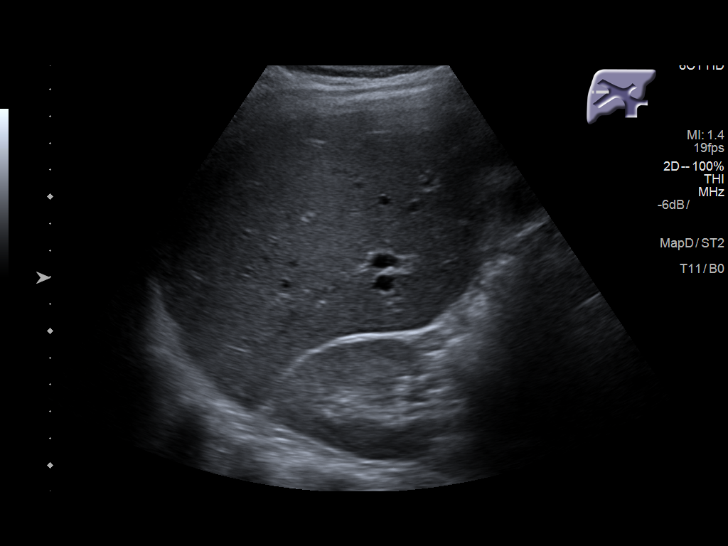
[im 58/58]
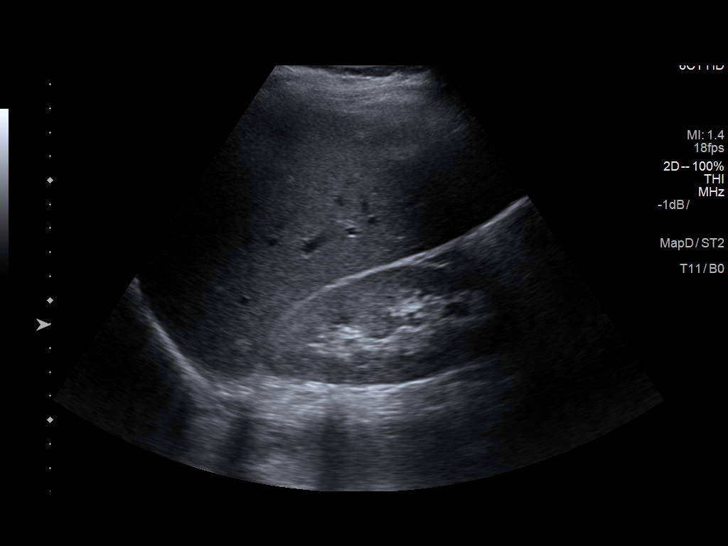

[14 of 25 positions shown; findings below may reference images not displayed]

FINDINGS: Gallbladder:

Gallbladder is mostly contracted. No gallstones. No significant wall
thickening. No pericholecystic fluid.

Common bile duct:

Diameter: 7 mm. No duct stone visualized. Distal aspect of the duct
was obscured by bowel gas.

Liver:

No focal lesion identified. Within normal limits in parenchymal
echogenicity. Portal vein is patent on color Doppler imaging with
normal direction of blood flow towards the liver.
IMPRESSION: 1. Dilated common bile duct. No duct stone visualized. If there are
symptoms of biliary obstruction, follow-up ERCP or MRCP would be
recommended.
2. No other abnormalities.  No evidence of acute cholecystitis.

## 2018-05-07 ENCOUNTER — Telehealth: Payer: Self-pay | Admitting: Podiatry

## 2018-05-07 NOTE — Telephone Encounter (Signed)
Pt is having some pain and would like for a nurse to call her back. She has a few questions.

## 2018-06-04 ENCOUNTER — Ambulatory Visit (INDEPENDENT_AMBULATORY_CARE_PROVIDER_SITE_OTHER): Payer: BLUE CROSS/BLUE SHIELD

## 2018-06-04 ENCOUNTER — Ambulatory Visit: Payer: BLUE CROSS/BLUE SHIELD | Admitting: Podiatry

## 2018-06-04 ENCOUNTER — Encounter: Payer: Self-pay | Admitting: Podiatry

## 2018-06-04 DIAGNOSIS — M659 Synovitis and tenosynovitis, unspecified: Secondary | ICD-10-CM

## 2018-06-04 DIAGNOSIS — M7751 Other enthesopathy of right foot: Secondary | ICD-10-CM

## 2018-06-04 MED ORDER — METHYLPREDNISOLONE 4 MG PO TBPK: ORAL_TABLET | ORAL | 0 refills | Status: DC

## 2018-06-04 MED ORDER — MELOXICAM 15 MG PO TABS: 15.0000 mg | ORAL_TABLET | Freq: Every day | ORAL | 1 refills | Status: DC

## 2018-06-06 NOTE — Progress Notes (Signed)
   Subjective:  Patient presents today status post ankle arthroscopy and osteochondral drilling right. DOS: 02/14/18. She reports right ankle pain that began one month ago. She states it worsened yesterday. Walking increases the pain. She has not done anything for treatment. Patient is here for further evaluation and treatment.   Past Medical History:  Diagnosis Date  . Abnormal uterine bleeding (AUB)   . Allergy    seasonal allergies.  . Asthma   . Constipation   . Enlarged uterus   . Heavy periods   . Hepatitis A 09/16/2017  . Vaginal Pap smear, abnormal       Objective/Physical Exam Neurovascular status intact.  Skin incisions appear to be well coapted. No sign of infectious process noted. No dehiscence. No active bleeding noted. Moderate edema noted to the surgical extremity.  Radiographic Exam:  Orthopedic hardware and osteotomies sites appear to be stable with routine healing.   Assessment: 1. s/p ankle arthroscopy and osteochondral drilling right. DOS: 02/14/18 2. Right ankle synovitis    Plan of Care:  1. Patient was evaluated. X-Rays reviewed.  2. Injection of 0.5 mLs Celestone Soluspan injected into the right ankle joint.  3. Prescription for Medrol Dose Pak provided to patient.  4. Prescription for Meloxicam provided to patient.  5. Continue wearing ankle brace.  6. Return to clinic in 4 weeks.     Felecia ShellingBrent M. Mylinh Cragg, DPM [facility]  Dr. Felecia ShellingBrent M. Aleksei Goodlin, DPM    [address]                                        [place], [place] [address]                Office [phone]  Fax [phone]

## 2018-06-25 ENCOUNTER — Ambulatory Visit: Payer: BLUE CROSS/BLUE SHIELD | Admitting: Obstetrics and Gynecology

## 2018-07-03 ENCOUNTER — Encounter: Payer: Self-pay | Admitting: Obstetrics and Gynecology

## 2018-07-03 ENCOUNTER — Ambulatory Visit (INDEPENDENT_AMBULATORY_CARE_PROVIDER_SITE_OTHER): Payer: BLUE CROSS/BLUE SHIELD | Admitting: Obstetrics and Gynecology

## 2018-07-03 VITALS — BP 107/67 | HR 72 | Ht 64.0 in | Wt 161.4 lb

## 2018-07-03 DIAGNOSIS — Z30432 Encounter for removal of intrauterine contraceptive device: Secondary | ICD-10-CM

## 2018-07-03 MED ORDER — CONCEPT OB 130-92.4-1 MG PO CAPS: 1.0000 | ORAL_CAPSULE | Freq: Every day | ORAL | 11 refills | Status: DC

## 2018-07-04 NOTE — Patient Instructions (Signed)
1.  IUD is removed today. 2.  Begin prenatal vitamins 3.  Keep regular gynecologic appointments scheduled.

## 2018-07-04 NOTE — Progress Notes (Signed)
Chief complaint: 1.  Desires conception 2.  Desires IUD removal  Christy Farmer presents today for her Mirena IUD removal.  She does have significant history of dysmenorrhea and menorrhagia and understands that with removal of the IUD, her symptomatology will likely return. However, patient is desiring conception.  She is starting prenatal vitamins and understands the importance of having them on board in order to reduce risk for spina bifida.  OBJECTIVE: BP 107/67   Pulse 72   Ht 5\' 4"  (1.626 m)   Wt 161 lb 6.4 oz (73.2 kg)   BMI 27.70 kg/m  Pleasant well-appearing female no acute distress.  Alert and oriented. Pelvic: External genitalia-normal BUS-normal Vagina-normal without discharge Cervix-no lesions; IUD strings are 3 cm; no cervical motion tenderness Rectovaginal-normal external exam  PROCEDURE: IUD removal Verbal consent is obtained.  Patient is placed in dorsolithotomy position.  Graves speculum is placed in the vagina to facilitate visualization of the cervix.  [place]eman forceps were used to grasp IUD strings with subsequent removal in normal fashion.  Minimal spotting is encountered.  Complications are none.  Patient tolerated procedure well.  ASSESSMENT: 1.  Desires IUD removal 2.  Desires conception 3.  IUD successfully removed  PLAN: 1.  Begin prenatal vitamins 2.  IUD is removed 3.  Follow-up for routine health maintenance as scheduled.  Christy HarmsMartin A Paz Winsett, MD  Note: This dictation was prepared with Dragon dictation along with smaller phrase technology. Any transcriptional errors that result from this process are unintentional.

## 2018-07-05 ENCOUNTER — Encounter: Payer: Self-pay | Admitting: Podiatry

## 2018-07-05 ENCOUNTER — Ambulatory Visit (INDEPENDENT_AMBULATORY_CARE_PROVIDER_SITE_OTHER): Payer: BLUE CROSS/BLUE SHIELD | Admitting: Podiatry

## 2018-07-05 DIAGNOSIS — M659 Synovitis and tenosynovitis, unspecified: Secondary | ICD-10-CM | POA: Diagnosis not present

## 2018-07-05 DIAGNOSIS — M899 Disorder of bone, unspecified: Secondary | ICD-10-CM | POA: Diagnosis not present

## 2018-07-05 DIAGNOSIS — M949 Disorder of cartilage, unspecified: Secondary | ICD-10-CM

## 2018-07-05 MED ORDER — MELOXICAM 15 MG PO TABS: 15.0000 mg | ORAL_TABLET | Freq: Every day | ORAL | 1 refills | Status: AC

## 2018-07-08 NOTE — Progress Notes (Signed)
   Subjective:  Patient presents today status post ankle arthroscopy and osteochondral drilling right. DOS: 02/14/18. She states the ankle pain has improved but now the pain is radiating up the anterior shin. She has been taking Meloxicam as directed. Patient is here for further evaluation and treatment.   Past Medical History:  Diagnosis Date  . Abnormal uterine bleeding (AUB)   . Allergy    seasonal allergies.  . Asthma   . Constipation   . Enlarged uterus   . Heavy periods   . Hepatitis A 09/16/2017  . Vaginal Pap smear, abnormal       Objective/Physical Exam Neurovascular status intact.  Skin incisions appear to be well coapted. No sign of infectious process noted. No dehiscence. No active bleeding noted. Moderate edema noted to the surgical extremity.  Radiographic Exam:  Orthopedic hardware and osteotomies sites appear to be stable with routine healing.   Assessment: 1. s/p ankle arthroscopy and osteochondral drilling right. DOS: 02/14/18 2. Right ankle synovitis    Plan of Care:  1. Patient was evaluated. 2. May resume full activity with no restrictions.  3. Recommended good shoe gear.  4. Refill prescription for Meloxicam 15 mg provided to patient.  5. Return to clinic as needed.    Felecia ShellingBrent M. Evans, DPM [facility]  Dr. Felecia ShellingBrent M. Evans, DPM    [address]                                        [place], [place] [address]                Office [phone]  Fax [phone]

## 2018-08-01 DIAGNOSIS — Z23 Encounter for immunization: Secondary | ICD-10-CM | POA: Diagnosis not present

## 2018-10-20 ENCOUNTER — Encounter: Payer: Self-pay | Admitting: Podiatry

## 2019-01-16 ENCOUNTER — Encounter: Payer: Self-pay | Admitting: Family Medicine

## 2019-01-16 ENCOUNTER — Ambulatory Visit (INDEPENDENT_AMBULATORY_CARE_PROVIDER_SITE_OTHER): Payer: BLUE CROSS/BLUE SHIELD | Admitting: Family Medicine

## 2019-01-16 ENCOUNTER — Other Ambulatory Visit: Payer: Self-pay

## 2019-01-16 DIAGNOSIS — J4520 Mild intermittent asthma, uncomplicated: Secondary | ICD-10-CM | POA: Diagnosis not present

## 2019-01-16 DIAGNOSIS — R0982 Postnasal drip: Secondary | ICD-10-CM | POA: Diagnosis not present

## 2019-01-16 MED ORDER — FLUTICASONE PROPIONATE 50 MCG/ACT NA SUSP: 2.0000 | Freq: Every day | NASAL | 6 refills | Status: DC

## 2019-01-16 MED ORDER — ALBUTEROL SULFATE HFA 108 (90 BASE) MCG/ACT IN AERS: 2.0000 | INHALATION_SPRAY | Freq: Four times a day (QID) | RESPIRATORY_TRACT | 0 refills | Status: DC | PRN

## 2019-01-16 MED ORDER — LEVOCETIRIZINE DIHYDROCHLORIDE 5 MG PO TABS: 5.0000 mg | ORAL_TABLET | Freq: Every evening | ORAL | 1 refills | Status: DC

## 2019-01-16 NOTE — Progress Notes (Signed)
Name: Christy Farmer   MRN: [id number]    DOB: 11-27-1985   Date:01/16/2019       Progress Note  Subjective  Chief Complaint  Chief Complaint  Patient presents with  . Sore Throat    for 2 weeks    I connected with .Darris Leonides Sake on 01/16/19 at  7:20 AM EDT by a video enabled telemedicine application and verified that I am speaking with the correct person using two identifiers.  I discussed the limitations of evaluation and management by telemedicine and the availability of in person appointments. The patient expressed understanding and agreed to proceed. Staff also discussed with the patient that there may be a patient responsible charge related to this service. Patient Location: Home Provider Location: Home Additional Individuals present: None  HPI  Pt presents with concern for sore throat for a few weeks.  She denies fever, cough, chest pain, nasal congestion, body aches.  She tried looking in the back of her throat but couldn't see anything. Does endorse some post nasal drip and phlegm in the back of her throat - worse at night.  Has been taking tylenol and this has not helped her much.  She does note occasional shortness of breath - states she feels that her asthma has been acting up this allergy season.  No travel within 14 days of symptom onset and no contact with any confirmed or suspected COVID19 patients.  Patient Active Problem List   Diagnosis Date Noted  . Viral hepatitis A without hepatic coma 10/18/2017  . Jaundice 09/16/2017  . Well woman exam without gynecological exam 11/17/2016  . Contact dermatitis 04/28/2016  . Skin rash 04/24/2016  . Annual physical exam 12/01/2015  . Sore throat 12/01/2015  . Viral pharyngitis 08/18/2015  . Encounter to establish care with new doctor 08/13/2015  . Asthma 08/13/2015  . History of abnormal cervical Pap smear 06/15/2015  . Enlarged uterus 06/15/2015  . Menorrhagia 06/15/2015  . Dysmenorrhea 06/15/2015    Social  History   Tobacco Use  . Smoking status: Never Smoker  . Smokeless tobacco: Never Used  Substance Use Topics  . Alcohol use: No    Alcohol/week: 2.0 standard drinks    Types: 2 Glasses of wine per week    Frequency: Never     Current Outpatient Medications:  .  levonorgestrel (MIRENA) 20 MCG/24HR IUD, 1 each by Intrauterine route once., Disp: , Rfl:  .  Prenat w/o A Vit-FeFum-FePo-FA (CONCEPT OB) 130-92.4-1 MG CAPS, Take 1 capsule by mouth daily. (Patient not taking: Reported on 01/16/2019), Disp: 30 capsule, Rfl: 11  No Known Allergies  I personally reviewed active problem list, medication list, allergies with the patient/caregiver today.  ROS  Ten systems reviewed and is negative except as mentioned in HPI  Objective  Virtual encounter, vitals not obtained.  There is no height or weight on file to calculate BMI.  Nursing Note and Vital Signs reviewed.  Physical Exam  Constitutional: Patient appears well-developed and well-nourished. No distress.  HENT: Head: Normocephalic and atraumatic.  Neck: Normal range of motion. Pulmonary/Chest: Effort normal. No respiratory distress. Speaking in complete sentences Neurological: Pt is alert and oriented to person, place, and time. Coordination, speech and gait are normal.  Psychiatric: Patient has a normal mood and affect. behavior is normal. Judgment and thought content normal.  No results found for this or any previous visit (from the past 72 hour(s)).  Assessment & Plan  1. Mild intermittent asthma without complication -  levocetirizine (XYZAL) 5 MG tablet; Take 1 tablet (5 mg total) by mouth every evening.  Dispense: 90 tablet; Refill: 1 - albuterol (PROVENTIL HFA;VENTOLIN HFA) 108 (90 Base) MCG/ACT inhaler; Inhale 2 puffs into the lungs every 6 (six) hours as needed for wheezing or shortness of breath.  Dispense: 1 Inhaler; Refill: 0  2. Post-nasal drip - levocetirizine (XYZAL) 5 MG tablet; Take 1 tablet (5 mg total) by  mouth every evening.  Dispense: 90 tablet; Refill: 1 - fluticasone (FLONASE) 50 MCG/ACT nasal spray; Place 2 sprays into both nostrils daily.  Dispense: 16 g; Refill: 6  -Red flags and when to present for emergency care or RTC including fever >101.22F, chest pain, shortness of breath, new/worsening/un-resolving symptoms, reviewed with patient at time of visit. Follow up and care instructions discussed and provided in AVS. - I discussed the assessment and treatment plan with the patient. The patient was provided an opportunity to ask questions and all were answered. The patient agreed with the plan and demonstrated an understanding of the instructions.  I provided 12 minutes of non-face-to-face time during this encounter.  Doren Custard, FNP

## 2019-03-25 ENCOUNTER — Other Ambulatory Visit: Payer: Self-pay

## 2019-03-25 ENCOUNTER — Other Ambulatory Visit (HOSPITAL_COMMUNITY)
Admission: RE | Admit: 2019-03-25 | Discharge: 2019-03-25 | Disposition: A | Discharge status: Home / Self Care | Payer: BC Managed Care – PPO | Source: Ambulatory Visit | Attending: Obstetrics and Gynecology | Admitting: Obstetrics and Gynecology

## 2019-03-25 ENCOUNTER — Ambulatory Visit (INDEPENDENT_AMBULATORY_CARE_PROVIDER_SITE_OTHER): Payer: BC Managed Care – PPO | Admitting: Obstetrics and Gynecology

## 2019-03-25 ENCOUNTER — Encounter: Payer: Self-pay | Admitting: Obstetrics and Gynecology

## 2019-03-25 VITALS — BP 110/71 | HR 65 | Ht 64.0 in | Wt 160.6 lb

## 2019-03-25 DIAGNOSIS — Z01419 Encounter for gynecological examination (general) (routine) without abnormal findings: Secondary | ICD-10-CM | POA: Diagnosis not present

## 2019-03-25 DIAGNOSIS — Z124 Encounter for screening for malignant neoplasm of cervix: Secondary | ICD-10-CM | POA: Insufficient documentation

## 2019-03-25 DIAGNOSIS — N926 Irregular menstruation, unspecified: Secondary | ICD-10-CM | POA: Diagnosis not present

## 2019-03-25 LAB — POCT URINE PREGNANCY: Preg Test, Ur: NEGATIVE

## 2019-03-25 NOTE — Progress Notes (Signed)
Patient comes in today for well woman exam. She has no concerns today. She is due for labs and PAP.

## 2019-03-25 NOTE — Progress Notes (Signed)
HPI:      Ms. Tanielle Emigh is a 33 y.o. G1P1001 who LMP was No LMP recorded (lmp unknown).  Subjective:   She presents today for her annual examination.  She had her IUD removed approximately 8 months ago considering pregnancy.  She is currently using withdrawal method" if it happens it happens".  She did want to discuss use of prenatal vitamins and timing of intercourse.  In addition she states she has missed a menstrual period and has breast tenderness but she did a home pregnancy test on Friday and it was negative.  Has a history of hepatitis A.  She says this has resolved and is no longer an issue. Patient asking about weight loss-states that she would like to lose 20 pounds.     Hx: The following portions of the patient's history were reviewed and updated as appropriate:             She  has a past medical history of Abnormal uterine bleeding (AUB), Allergy, Asthma, Constipation, Enlarged uterus, Heavy periods, Hepatitis A (09/16/2017), and Vaginal Pap smear, abnormal. She does not have any pertinent problems on file. She  has a past surgical history that includes Colposcopy and Ankle surgery. Her family history includes Healthy in her brother and father; Heart attack in her maternal grandmother; Hyperlipidemia in her mother; Hypertension in her mother. She  reports that she has never smoked. She has never used smokeless tobacco. She reports that she does not drink alcohol or use drugs. She has a current medication list which includes the following prescription(s): albuterol and fluticasone. She has No Known Allergies.       Review of Systems:  Review of Systems  Constitutional: Denied constitutional symptoms, night sweats, recent illness, fatigue, fever, insomnia and weight loss.  Eyes: Denied eye symptoms, eye pain, photophobia, vision change and visual disturbance.  Ears/Nose/Throat/Neck: Denied ear, nose, throat or neck symptoms, hearing loss, nasal discharge, sinus  congestion and sore throat.  Cardiovascular: Denied cardiovascular symptoms, arrhythmia, chest pain/pressure, edema, exercise intolerance, orthopnea and palpitations.  Respiratory: Denied pulmonary symptoms, asthma, pleuritic pain, productive sputum, cough, dyspnea and wheezing.  Gastrointestinal: Denied, gastro-esophageal reflux, melena, nausea and vomiting.  Genitourinary: Denied genitourinary symptoms including symptomatic vaginal discharge, pelvic relaxation issues, and urinary complaints.  Musculoskeletal: Denied musculoskeletal symptoms, stiffness, swelling, muscle weakness and myalgia.  Dermatologic: Denied dermatology symptoms, rash and scar.  Neurologic: Denied neurology symptoms, dizziness, headache, neck pain and syncope.  Psychiatric: Denied psychiatric symptoms, anxiety and depression.  Endocrine: Denied endocrine symptoms including hot flashes and night sweats.   Meds:   Current Outpatient Medications on File Prior to Visit  Medication Sig Dispense Refill  . albuterol (PROVENTIL HFA;VENTOLIN HFA) 108 (90 Base) MCG/ACT inhaler Inhale 2 puffs into the lungs every 6 (six) hours as needed for wheezing or shortness of breath. 1 Inhaler 0  . fluticasone (FLONASE) 50 MCG/ACT nasal spray Place 2 sprays into both nostrils daily. 16 g 6   No current facility-administered medications on file prior to visit.     Objective:     Vitals:   03/25/19 1436  BP: 110/71  Pulse: 65              Physical examination General NAD, Conversant  HEENT Atraumatic; Op clear with mmm.  Normo-cephalic. Pupils reactive. Anicteric sclerae  Thyroid/Neck Smooth without nodularity or enlargement. Normal ROM.  Neck Supple.  Skin No rashes, lesions or ulceration. Normal palpated skin turgor. No nodularity.  Breasts: No masses or  discharge.  Symmetric.  No axillary adenopathy.  Lungs: Clear to auscultation.No rales or wheezes. Normal Respiratory effort, no retractions.  Heart: NSR.  No murmurs or rubs  appreciated. No periferal edema  Abdomen: Soft.  Non-tender.  No masses.  No HSM. No hernia  Extremities: Moves all appropriately.  Normal ROM for age. No lymphadenopathy.  Neuro: Oriented to PPT.  Normal mood. Normal affect.     Pelvic:   Vulva: Normal appearance.  No lesions.  Vagina: No lesions or abnormalities noted.  Support: Normal pelvic support.  Urethra No masses tenderness or scarring.  Meatus Normal size without lesions or prolapse.  Cervix: Normal appearance.  No lesions.  Anus: Normal exam.  No lesions.  Perineum: Normal exam.  No lesions.        Bimanual   Uterus: Normal size.  Non-tender.  Mobile.  AV.  Adnexae: No masses.  Non-tender to palpation.  Cul-de-sac: Negative for abnormality.    Urinary pregnancy test negative  Assessment:    G1P1001 Patient Active Problem List   Diagnosis Date Noted  . Viral hepatitis A without hepatic coma 10/18/2017  . Jaundice 09/16/2017  . Well woman exam without gynecological exam 11/17/2016  . Contact dermatitis 04/28/2016  . Skin rash 04/24/2016  . Annual physical exam 12/01/2015  . Sore throat 12/01/2015  . Viral pharyngitis 08/18/2015  . Encounter to establish care with new doctor 08/13/2015  . Asthma 08/13/2015  . History of abnormal cervical Pap smear 06/15/2015  . Enlarged uterus 06/15/2015  . Menorrhagia 06/15/2015  . Dysmenorrhea 06/15/2015     1. Well woman exam with routine gynecological exam   2. Cervical cancer screening   3. Missed menses     Normal exam   Plan:            1.  Basic Screening Recommendations The basic screening recommendations for asymptomatic women were discussed with the patient during her visit.  The age-appropriate recommendations were discussed with her and the rational for the tests reviewed.  When I am informed by the patient that another primary care physician has previously obtained the age-appropriate tests and they are up-to-date, only outstanding tests are ordered and  referrals given as necessary.  Abnormal results of tests will be discussed with her when all of her results are completed. Pap performed -labs ordered 2.  Because of questionable birth control breast tenderness and missed menstrual.  We will do a urine pregnancy test today. 3.  Advice given regarding conceptual prenatal vitamins 4.  Weight loss discussed especially caloric modification including intake and exercise.  Orders Orders Placed This Encounter  Procedures  . Hemoglobin A1c  . Lipid panel  . TSH    No orders of the defined types were placed in this encounter.     F/U  No follow-ups on file.  Elonda Huskyavid J. Nashae Maudlin, M.D. 03/25/2019 3:08 PM

## 2019-03-26 ENCOUNTER — Other Ambulatory Visit: Payer: Self-pay | Admitting: Surgical

## 2019-03-26 ENCOUNTER — Other Ambulatory Visit: Payer: BC Managed Care – PPO

## 2019-03-26 DIAGNOSIS — B159 Hepatitis A without hepatic coma: Secondary | ICD-10-CM

## 2019-03-26 DIAGNOSIS — Z01419 Encounter for gynecological examination (general) (routine) without abnormal findings: Secondary | ICD-10-CM | POA: Diagnosis not present

## 2019-03-27 LAB — HEMOGLOBIN A1C
Est. average glucose Bld gHb Est-mCnc: 108 mg/dL
Hgb A1c MFr Bld: 5.4 % (ref 4.8–5.6)

## 2019-03-27 LAB — TSH: TSH: 3.56 u[IU]/mL (ref 0.450–4.500)

## 2019-03-27 LAB — HEPATIC FUNCTION PANEL
ALT: 12 IU/L (ref 0–32)
AST: 17 IU/L (ref 0–40)
Albumin: 4.5 g/dL (ref 3.8–4.8)
Alkaline Phosphatase: 42 IU/L (ref 39–117)
Bilirubin Total: 0.3 mg/dL (ref 0.0–1.2)
Bilirubin, Direct: 0.06 mg/dL (ref 0.00–0.40)
Total Protein: 6.8 g/dL (ref 6.0–8.5)

## 2019-03-27 LAB — LIPID PANEL
Chol/HDL Ratio: 4 ratio (ref 0.0–4.4)
Cholesterol, Total: 208 mg/dL — ABNORMAL HIGH (ref 100–199)
HDL: 52 mg/dL (ref >39)
LDL Calculated: 129 mg/dL — ABNORMAL HIGH (ref 0–99)
Triglycerides: 134 mg/dL (ref 0–149)
VLDL Cholesterol Cal: 27 mg/dL (ref 5–40)

## 2019-04-02 ENCOUNTER — Other Ambulatory Visit: Payer: Self-pay | Admitting: Surgical

## 2019-04-02 DIAGNOSIS — B159 Hepatitis A without hepatic coma: Secondary | ICD-10-CM

## 2019-04-02 LAB — CYTOLOGY - PAP
HPV 16/18/45 genotyping: NEGATIVE
HPV: DETECTED — AB

## 2019-04-14 ENCOUNTER — Telehealth: Payer: Self-pay

## 2019-04-14 NOTE — Telephone Encounter (Signed)
Coronavirus (COVID-19) Are you at risk?  Are you at risk for the Coronavirus (COVID-19)?  To be considered HIGH RISK for Coronavirus (COVID-19), you have to meet the following criteria:  . Traveled to [place], [place], [place], [place] or [place]; or in the [place] to [place], [place], [place], or [place]; and have fever, cough, and shortness of breath within the last 2 weeks of travel OR . Been in close contact with a person diagnosed with COVID-19 within the last 2 weeks and have fever, cough, and shortness of breath . IF YOU DO NOT MEET THESE CRITERIA, YOU ARE CONSIDERED LOW RISK FOR COVID-19.  What to do if you are HIGH RISK for COVID-19?  . If you are having a medical emergency, call 911. . Seek medical care right away. Before you go to a doctor's office, urgent care or emergency department, call ahead and tell them about your recent travel, contact with someone diagnosed with COVID-19, and your symptoms. You should receive instructions from your physician's office regarding next steps of care.  . When you arrive at healthcare provider, tell the healthcare staff immediately you have returned from visiting [place], [place], [place], [place] or [place]; or traveled in the [place] to [place], [place], [place], or [place]; in the last two weeks or you have been in close contact with a person diagnosed with COVID-19 in the last 2 weeks.   . Tell the health care staff about your symptoms: fever, cough and shortness of breath. . After you have been seen by a medical provider, you will be either: o Tested for (COVID-19) and discharged home on quarantine except to seek medical care if symptoms worsen, and asked to  - Stay home and avoid contact with others until you get your results (4-5 days)  - Avoid travel on public transportation if possible (such as bus, train, or airplane) or o Sent to the Emergency Department by EMS for evaluation, COVID-19 testing, and possible  admission depending on your condition and test results.  What to do if you are LOW RISK for COVID-19?  Reduce your risk of any infection by using the same precautions used for avoiding the common cold or flu:  . Wash your hands often with soap and warm water for at least 20 seconds.  If soap and water are not readily available, use an alcohol-based hand sanitizer with at least 60% alcohol.  . If coughing or sneezing, cover your mouth and nose by coughing or sneezing into the elbow areas of your shirt or coat, into a tissue or into your sleeve (not your hands). . Avoid shaking hands with others and consider head nods or verbal greetings only. . Avoid touching your eyes, nose, or mouth with unwashed hands.  . Avoid close contact with people who are Christy Farmer. . Avoid places or events with large numbers of people in one location, like concerts or sporting events. . Carefully consider travel plans you have or are making. . If you are planning any travel outside or inside the [place], visit the CDC's Travelers' Health webpage for the latest health notices. . If you have some symptoms but not all symptoms, continue to monitor at home and seek medical attention if your symptoms worsen. . If you are having a medical emergency, call 911.  04/14/19 SCREENING NEG SLS ADDITIONAL HEALTHCARE OPTIONS FOR PATIENTS  [facility] / e-Visit: [email or web address]         [facility]: [phone]    [facility]: [phone]                   [facility]: [phone]  

## 2019-04-15 ENCOUNTER — Ambulatory Visit (INDEPENDENT_AMBULATORY_CARE_PROVIDER_SITE_OTHER): Payer: BC Managed Care – PPO | Admitting: Obstetrics and Gynecology

## 2019-04-15 ENCOUNTER — Encounter: Payer: Self-pay | Admitting: Obstetrics and Gynecology

## 2019-04-15 ENCOUNTER — Other Ambulatory Visit (HOSPITAL_COMMUNITY)
Admission: RE | Admit: 2019-04-15 | Discharge: 2019-04-15 | Disposition: A | Discharge status: Home / Self Care | Payer: BC Managed Care – PPO | Source: Ambulatory Visit | Attending: Obstetrics and Gynecology | Admitting: Obstetrics and Gynecology

## 2019-04-15 ENCOUNTER — Other Ambulatory Visit: Payer: Self-pay

## 2019-04-15 VITALS — BP 106/68 | HR 67 | Ht 64.0 in | Wt 161.2 lb

## 2019-04-15 DIAGNOSIS — N870 Mild cervical dysplasia: Secondary | ICD-10-CM | POA: Diagnosis not present

## 2019-04-15 DIAGNOSIS — N72 Inflammatory disease of cervix uteri: Secondary | ICD-10-CM | POA: Insufficient documentation

## 2019-04-15 DIAGNOSIS — R87610 Atypical squamous cells of undetermined significance on cytologic smear of cervix (ASC-US): Secondary | ICD-10-CM | POA: Insufficient documentation

## 2019-04-15 DIAGNOSIS — B977 Papillomavirus as the cause of diseases classified elsewhere: Secondary | ICD-10-CM

## 2019-04-15 NOTE — Addendum Note (Signed)
Addended by: Durwin Glaze on: 04/15/2019 02:30 PM   Modules accepted: Orders

## 2019-04-15 NOTE — Progress Notes (Signed)
   HPI:  Christy Farmer is a 33 y.o.  G1P1001  who presents today for evaluation and management of abnormal cervical cytology.    Dysplasia History: Patient describes a remote history of previous HPV followed by colposcopy.  She says she does not remember but she thinks she never was treated for anything.  She reports that she has never had any problems since then.  She also states that she received the HPV "vaccine".  ROS:  Pertinent items are noted in HPI.  OB History  Gravida Para Term Preterm AB Living  1 1 1     1   SAB TAB Ectopic Multiple Live Births          1    # Outcome Date GA Lbr Len/2nd Weight Sex Delivery Anes PTL Lv  1 Term    6 lb 14.4 oz (3.13 kg) M Vag-Spont   LIV    Past Medical History:  Diagnosis Date  . Abnormal uterine bleeding (AUB)   . Allergy    seasonal allergies.  . Asthma   . Constipation   . Enlarged uterus   . Heavy periods   . Hepatitis A 09/16/2017  . Vaginal Pap smear, abnormal     Past Surgical History:  Procedure Laterality Date  . ANKLE SURGERY    . COLPOSCOPY      SOCIAL HISTORY: Social History   Substance and Sexual Activity  Alcohol Use No  . Alcohol/week: 2.0 standard drinks  . Types: 2 Glasses of wine per week  . Frequency: Never   Social History   Substance and Sexual Activity  Drug Use No     Family History  Problem Relation Age of Onset  . Hyperlipidemia Mother   . Hypertension Mother   . Healthy Father   . Healthy Brother   . Heart attack Maternal Grandmother   . Cancer Neg Hx   . Diabetes Neg Hx   . Heart disease Neg Hx     ALLERGIES:  Patient has no known allergies.  She has a current medication list which includes the following prescription(s): albuterol and fluticasone.  Physical Exam: -Vitals:  BP 106/68   Pulse 67   Ht 5\' 4"  (1.626 m)   Wt 161 lb 3.2 oz (73.1 kg)   LMP  (LMP Unknown)   BMI 27.67 kg/m   PROCEDURE: 1.  Colposcopy performed with 4% acetic acid after verbal consent  obtained                           -Aceto-white Lesions Location(s): 2 o'clock.              -Biopsy performed at 2 o'clock               -ECC indicated and performed: No.     -Biopsy sites made hemostatic with pressure and Monsel's solution   -Satisfactory colposcopy: Yes.      -Evidence of Invasive cervical CA :  NO  ASSESSMENT:  Christy Farmer is a 33 y.o. G1P1001 here for No diagnosis found.Marland Kitchen  PLAN: 1.  I discussed the grading system of pap smears and HPV high risk viral types.  We will discuss management after colpo results return.  No orders of the defined types were placed in this encounter.          F/U  Return in about 2 weeks (around 04/29/2019) for Colpo f/u.  Jeannie Fend ,MD 04/15/2019,2:07 PM

## 2019-04-24 ENCOUNTER — Other Ambulatory Visit: Payer: Self-pay | Admitting: Family Medicine

## 2019-04-24 DIAGNOSIS — Z20822 Contact with and (suspected) exposure to covid-19: Secondary | ICD-10-CM

## 2019-04-29 ENCOUNTER — Encounter: Payer: BC Managed Care – PPO | Admitting: Obstetrics and Gynecology

## 2019-04-29 LAB — NOVEL CORONAVIRUS, NAA: SARS-CoV-2, NAA: NOT DETECTED

## 2019-05-07 ENCOUNTER — Encounter: Payer: Self-pay | Admitting: Obstetrics and Gynecology

## 2019-05-08 ENCOUNTER — Encounter: Payer: Self-pay | Admitting: Obstetrics and Gynecology

## 2019-05-08 ENCOUNTER — Ambulatory Visit (INDEPENDENT_AMBULATORY_CARE_PROVIDER_SITE_OTHER): Payer: Managed Care, Other (non HMO) | Admitting: Obstetrics and Gynecology

## 2019-05-08 ENCOUNTER — Other Ambulatory Visit: Payer: Self-pay

## 2019-05-08 VITALS — BP 122/84 | HR 84 | Ht 64.0 in | Wt 160.7 lb

## 2019-05-08 DIAGNOSIS — N870 Mild cervical dysplasia: Secondary | ICD-10-CM

## 2019-05-08 NOTE — Progress Notes (Signed)
HPI:      [name] is a 33 y.o. G1P1001 who LMP was No LMP recorded.  Subjective:   She presents today for follow-up of her colposcopy.  She has a remote history of HPV and her most recent Pap smear showed ASCUS.  She underwent colposcopy which now shows CIN-1.  She has had no issues status post colposcopy.  Has no concerns.    Hx: The following portions of the patient's history were reviewed and updated as appropriate:             She  has a past medical history of Abnormal uterine bleeding (AUB), Allergy, Asthma, Constipation, Enlarged uterus, Heavy periods, Hepatitis A (09/16/2017), and Vaginal Pap smear, abnormal. She does not have any pertinent problems on file. She  has a past surgical history that includes Colposcopy and Ankle surgery. Her family history includes Healthy in her brother and father; Heart attack in her maternal grandmother; Hyperlipidemia in her mother; Hypertension in her mother. She  reports that she has never smoked. She has never used smokeless tobacco. She reports that she does not drink alcohol or use drugs. She has a current medication list which includes the following prescription(s): albuterol and fluticasone. She has No Known Allergies.       Review of Systems:  Review of Systems  Constitutional: Denied constitutional symptoms, night sweats, recent illness, fatigue, fever, insomnia and weight loss.  Eyes: Denied eye symptoms, eye pain, photophobia, vision change and visual disturbance.  Ears/Nose/Throat/Neck: Denied ear, nose, throat or neck symptoms, hearing loss, nasal discharge, sinus congestion and sore throat.  Cardiovascular: Denied cardiovascular symptoms, arrhythmia, chest pain/pressure, edema, exercise intolerance, orthopnea and palpitations.  Respiratory: Denied pulmonary symptoms, asthma, pleuritic pain, productive sputum, cough, dyspnea and wheezing.  Gastrointestinal: Denied, gastro-esophageal reflux, melena, nausea and vomiting.   Genitourinary: Denied genitourinary symptoms including symptomatic vaginal discharge, pelvic relaxation issues, and urinary complaints.  Musculoskeletal: Denied musculoskeletal symptoms, stiffness, swelling, muscle weakness and myalgia.  Dermatologic: Denied dermatology symptoms, rash and scar.  Neurologic: Denied neurology symptoms, dizziness, headache, neck pain and syncope.  Psychiatric: Denied psychiatric symptoms, anxiety and depression.  Endocrine: Denied endocrine symptoms including hot flashes and night sweats.   Meds:   Current Outpatient Medications on File Prior to Visit  Medication Sig Dispense Refill  . albuterol (PROVENTIL HFA;VENTOLIN HFA) 108 (90 Base) MCG/ACT inhaler Inhale 2 puffs into the lungs every 6 (six) hours as needed for wheezing or shortness of breath. 1 Inhaler 0  . fluticasone (FLONASE) 50 MCG/ACT nasal spray Place 2 sprays into both nostrils daily. 16 g 6   No current facility-administered medications on file prior to visit.     Objective:     Vitals:   05/08/19 1451  BP: 122/84  Pulse: 84              Pathology results reviewed directly with the patient  Assessment:    G1P1001 Patient Active Problem List   Diagnosis Date Noted  . Viral hepatitis A without hepatic coma 10/18/2017  . Jaundice 09/16/2017  . Well woman exam without gynecological exam 11/17/2016  . Contact dermatitis 04/28/2016  . Skin rash 04/24/2016  . Annual physical exam 12/01/2015  . Sore throat 12/01/2015  . Viral pharyngitis 08/18/2015  . Encounter to establish care with new doctor 08/13/2015  . Asthma 08/13/2015  . History of abnormal cervical Pap smear 06/15/2015  . Enlarged uterus 06/15/2015  . Menorrhagia 06/15/2015  . Dysmenorrhea 06/15/2015     1.  CIN I (cervical intraepithelial neoplasia I)        Plan:            1.  We have discussed the natural course and history of HPV and cervical dysplasia and their relationship.  I recommended a follow-up  colposcopy in 6 months. Orders No orders of the defined types were placed in this encounter.   No orders of the defined types were placed in this encounter.     F/U  Return in about 6 months (around 11/08/2019). I spent 18 minutes involved in the care of this patient of which greater than 50% was spent discussing natural course and history of HPV, relationship of dysplasia to HPV.  CIN-1 versus 2 versus 3.  All questions answered.  Finis Bud, M.D. 05/08/2019 3:21 PM

## 2019-05-08 NOTE — Progress Notes (Signed)
Patient comes in today for colposcopy results.  

## 2019-05-09 ENCOUNTER — Encounter: Payer: Self-pay | Admitting: Family Medicine

## 2019-05-09 ENCOUNTER — Ambulatory Visit: Payer: Managed Care, Other (non HMO) | Admitting: Family Medicine

## 2019-05-09 VITALS — BP 102/64 | HR 91 | Temp 97.3°F | Resp 14 | Ht 64.0 in | Wt 159.9 lb

## 2019-05-09 DIAGNOSIS — M659 Synovitis and tenosynovitis, unspecified: Secondary | ICD-10-CM | POA: Diagnosis not present

## 2019-05-09 DIAGNOSIS — Z1389 Encounter for screening for other disorder: Secondary | ICD-10-CM | POA: Diagnosis not present

## 2019-05-09 NOTE — Progress Notes (Signed)
Name: Christy Farmer   MRN: [id number]    DOB: 1986-05-02   Date:05/09/2019       Progress Note  Subjective  Chief Complaint  Chief Complaint  Patient presents with  . skin tag    HPI  PT presents with concern for abnormal mole on the RIGHT cheek.  She has had it for years, but has grown significantly over the last few months.  She denies itching or pain.  She is concerned and would like it biopsies. Maternal aunt with history of skin cancer. She does wear sunscreen, but she burns easily.  We will refer to dermatology as this is a facial lesion.   RIGHT Ankle pain: She had surgery last year done by [facility]. She had right ankle arthroscopic synovectomy in May of 2019.  She did do PT afterwards, and is still having pain.  She has been trying to do her home exercises, but it is quite painful.  She would like a second opinion.  Patient Active Problem List   Diagnosis Date Noted  . Viral hepatitis A without hepatic coma 10/18/2017  . Jaundice 09/16/2017  . Well woman exam without gynecological exam 11/17/2016  . Contact dermatitis 04/28/2016  . Skin rash 04/24/2016  . Annual physical exam 12/01/2015  . Sore throat 12/01/2015  . Viral pharyngitis 08/18/2015  . Encounter to establish care with new doctor 08/13/2015  . Asthma 08/13/2015  . History of abnormal cervical Pap smear 06/15/2015  . Enlarged uterus 06/15/2015  . Menorrhagia 06/15/2015  . Dysmenorrhea 06/15/2015    Social History   Tobacco Use  . Smoking status: Never Smoker  . Smokeless tobacco: Never Used  Substance Use Topics  . Alcohol use: No    Alcohol/week: 2.0 standard drinks    Types: 2 Glasses of wine per week    Frequency: Never     Current Outpatient Medications:  .  albuterol (PROVENTIL HFA;VENTOLIN HFA) 108 (90 Base) MCG/ACT inhaler, Inhale 2 puffs into the lungs every 6 (six) hours as needed for wheezing or shortness of breath., Disp: 1 Inhaler, Rfl: 0 .  fluticasone (FLONASE) 50  MCG/ACT nasal spray, Place 2 sprays into both nostrils daily., Disp: 16 g, Rfl: 6  No Known Allergies  I personally reviewed active problem list, medication list, allergies, notes from last encounter, lab results with the patient/caregiver today.  ROS  Ten systems reviewed and is negative except as mentioned in HPI  Objective  Vitals:   05/09/19 1253  BP: 102/64  Pulse: 91  Resp: 14  Temp: (!) 97.3 F (36.3 C)  SpO2: 98%  Weight: 159 lb 14.4 oz (72.5 kg)  Height: 5\' 4"  (1.626 m)   Body mass index is 27.45 kg/m.  Nursing Note and Vital Signs reviewed.  Physical Exam  Constitutional: Patient appears well-developed and well-nourished. No distress.  HENT: Head: Normocephalic and atraumatic.  Eyes: Conjunctivae and EOM are normal. No scleral icterus. Neck: Normal range of motion. Neck supple. Cardiovascular: Normal rate, regular rhythm and normal heart sounds.  No murmur heard. No BLE edema. Pulmonary/Chest: Effort normal and breath sounds normal. No respiratory distress. Neurological: Pt is alert and oriented to person, place, and time. No cranial nerve deficit. Coordination, balance, strength, speech and gait are normal.  Skin: Skin is warm and dry. No rash noted. No erythema. Very small indurated nevus that is flesh colored, on closer examination the nevus is rugated.   Psychiatric: Patient has a normal mood and affect. behavior is  normal. Judgment and thought content normal.  No results found for this or any previous visit (from the past 72 hour(s)).  Assessment & Plan  1. Synovitis of right ankle - Ambulatory referral to Orthopedics  2. Encounter for surveillance of abnormal nevi - Ambulatory referral to Dermatology -Red flags and when to present for emergency care or RTC including fever >101.43F, chest pain, shortness of breath, new/worsening/un-resolving symptoms, reviewed with patient at time of visit. Follow up and care instructions discussed and provided in AVS.

## 2019-05-09 NOTE — Patient Instructions (Signed)
Right ankle arthroscopic synovectomy in May of 2019

## 2019-05-20 ENCOUNTER — Encounter: Payer: Self-pay | Admitting: Family Medicine

## 2019-06-04 ENCOUNTER — Telehealth: Payer: Self-pay

## 2019-06-04 NOTE — Telephone Encounter (Signed)
Coronavirus (COVID-19) Are you at risk?  Are you at risk for the Coronavirus (COVID-19)?  To be considered HIGH RISK for Coronavirus (COVID-19), you have to meet the following criteria:  . Traveled to [place], [place], [place], [place] or [place]; or in the [place] to [place], [place], [place], or [place]; and have fever, cough, and shortness of breath within the last 2 weeks of travel OR . Been in close contact with a person diagnosed with COVID-19 within the last 2 weeks and have fever, cough, and shortness of breath . IF YOU DO NOT MEET THESE CRITERIA, YOU ARE CONSIDERED LOW RISK FOR COVID-19.  What to do if you are HIGH RISK for COVID-19?  . If you are having a medical emergency, call 911. . Seek medical care right away. Before you go to a doctor's office, urgent care or emergency department, call ahead and tell them about your recent travel, contact with someone diagnosed with COVID-19, and your symptoms. You should receive instructions from your physician's office regarding next steps of care.  . When you arrive at healthcare provider, tell the healthcare staff immediately you have returned from visiting [place], [place], [place], [place] or [place]; or traveled in the [place] to [place], [place], [place], or [place]; in the last two weeks or you have been in close contact with a person diagnosed with COVID-19 in the last 2 weeks.   . Tell the health care staff about your symptoms: fever, cough and shortness of breath. . After you have been seen by a medical provider, you will be either: o Tested for (COVID-19) and discharged home on quarantine except to seek medical care if symptoms worsen, and asked to  - Stay home and avoid contact with others until you get your results (4-5 days)  - Avoid travel on public transportation if possible (such as bus, train, or airplane) or o Sent to the Emergency Department by EMS for evaluation, COVID-19 testing, and possible  admission depending on your condition and test results.  What to do if you are LOW RISK for COVID-19?  Reduce your risk of any infection by using the same precautions used for avoiding the common cold or flu:  . Wash your hands often with soap and warm water for at least 20 seconds.  If soap and water are not readily available, use an alcohol-based hand sanitizer with at least 60% alcohol.  . If coughing or sneezing, cover your mouth and nose by coughing or sneezing into the elbow areas of your shirt or coat, into a tissue or into your sleeve (not your hands). . Avoid shaking hands with others and consider head nods or verbal greetings only. . Avoid touching your eyes, nose, or mouth with unwashed hands.  . Avoid close contact with people who are Marcel Sorter. . Avoid places or events with large numbers of people in one location, like concerts or sporting events. . Carefully consider travel plans you have or are making. . If you are planning any travel outside or inside the [place], visit the CDC's Travelers' Health webpage for the latest health notices. . If you have some symptoms but not all symptoms, continue to monitor at home and seek medical attention if your symptoms worsen. . If you are having a medical emergency, call 911.  06/04/19 SCREENING NEG SLS ADDITIONAL HEALTHCARE OPTIONS FOR PATIENTS  [facility] / e-Visit: [email or web address]         [facility]: [phone]    [facility]: [phone]                   [facility]: [phone]  

## 2019-06-05 ENCOUNTER — Other Ambulatory Visit: Payer: Self-pay

## 2019-06-05 ENCOUNTER — Ambulatory Visit: Payer: Managed Care, Other (non HMO) | Admitting: Obstetrics and Gynecology

## 2019-06-05 ENCOUNTER — Encounter: Payer: Self-pay | Admitting: Obstetrics and Gynecology

## 2019-06-05 VITALS — BP 116/77 | HR 89 | Ht 64.0 in | Wt 159.4 lb

## 2019-06-05 DIAGNOSIS — N926 Irregular menstruation, unspecified: Secondary | ICD-10-CM | POA: Diagnosis not present

## 2019-06-05 NOTE — Progress Notes (Signed)
HPI:      [name] is a 33 y.o. G1P1001 who LMP was No LMP recorded (lmp unknown).  Subjective:   She presents today after missing her menstrual period.  She states that she did pregnancy test at home and they were positive.  She and her partner were attempting pregnancy.  She is very excited about the possibility of being pregnant.  She is currently taking prenatal vitamins. She states that she is unsure of her last menstrual period she does not know if it occurred in June or July. (Possibly July 15) She has daily nausea without vomiting.  She describes some mild pelvic cramping without bleeding.    Hx: The following portions of the patient's history were reviewed and updated as appropriate:             She  has a past medical history of Abnormal uterine bleeding (AUB), Allergy, Asthma, Constipation, Enlarged uterus, Heavy periods, Hepatitis A (09/16/2017), and Vaginal Pap smear, abnormal. She does not have any pertinent problems on file. She  has a past surgical history that includes Colposcopy and Ankle surgery. Her family history includes Healthy in her brother and father; Heart attack in her maternal grandmother; Hyperlipidemia in her mother; Hypertension in her mother. She  reports that she has never smoked. She has never used smokeless tobacco. She reports that she does not drink alcohol or use drugs. She has a current medication list which includes the following prescription(s): albuterol, fluticasone, and prenatal multivitamin. She has No Known Allergies.       Review of Systems:  Review of Systems  Constitutional: Denied constitutional symptoms, night sweats, recent illness, fatigue, fever, insomnia and weight loss.  Eyes: Denied eye symptoms, eye pain, photophobia, vision change and visual disturbance.  Ears/Nose/Throat/Neck: Denied ear, nose, throat or neck symptoms, hearing loss, nasal discharge, sinus congestion and sore throat.  Cardiovascular: Denied  cardiovascular symptoms, arrhythmia, chest pain/pressure, edema, exercise intolerance, orthopnea and palpitations.  Respiratory: Denied pulmonary symptoms, asthma, pleuritic pain, productive sputum, cough, dyspnea and wheezing.  Gastrointestinal: Denied, gastro-esophageal reflux, melena, nausea and vomiting.  Genitourinary: See HPI for additional information.  Musculoskeletal: Denied musculoskeletal symptoms, stiffness, swelling, muscle weakness and myalgia.  Dermatologic: Denied dermatology symptoms, rash and scar.  Neurologic: Denied neurology symptoms, dizziness, headache, neck pain and syncope.  Psychiatric: Denied psychiatric symptoms, anxiety and depression.  Endocrine: Denied endocrine symptoms including hot flashes and night sweats.   Meds:   Current Outpatient Medications on File Prior to Visit  Medication Sig Dispense Refill  . albuterol (PROVENTIL HFA;VENTOLIN HFA) 108 (90 Base) MCG/ACT inhaler Inhale 2 puffs into the lungs every 6 (six) hours as needed for wheezing or shortness of breath. 1 Inhaler 0  . fluticasone (FLONASE) 50 MCG/ACT nasal spray Place 2 sprays into both nostrils daily. 16 g 6  . Prenatal Vit-Fe Fumarate-FA (PRENATAL MULTIVITAMIN) TABS tablet Take 1 tablet by mouth daily at 12 noon.     No current facility-administered medications on file prior to visit.     Objective:     Vitals:   06/05/19 1501  BP: 116/77  Pulse: 89              Urinary pregnancy test positive  Assessment:    G1P1001 Patient Active Problem List   Diagnosis Date Noted  . Viral hepatitis A without hepatic coma 10/18/2017  . Jaundice 09/16/2017  . Well woman exam without gynecological exam 11/17/2016  . Contact dermatitis 04/28/2016  . Skin rash 04/24/2016  .  Annual physical exam 12/01/2015  . Sore throat 12/01/2015  . Viral pharyngitis 08/18/2015  . Encounter to establish care with new doctor 08/13/2015  . Asthma 08/13/2015  . History of abnormal cervical Pap smear  06/15/2015  . Enlarged uterus 06/15/2015  . Menorrhagia 06/15/2015  . Dysmenorrhea 06/15/2015     1. Missed menses     Positive pregnancy test-likely IUP   Plan:            Prenatal Plan 1.  The patient was given prenatal literature. 2.  She was continued on prenatal vitamins. 3.  A prenatal lab panel was ordered or drawn. 4.  An ultrasound was ordered to better determine an EDC. 5.  A nurse visit was scheduled. 6.  Genetic testing and testing for other inheritable conditions discussed in detail. She will decide in the future whether to have these labs performed. 7.  A general overview of pregnancy testing, visit schedule, ultrasound schedule, and prenatal care was discussed.   Orders No orders of the defined types were placed in this encounter.   No orders of the defined types were placed in this encounter.     F/U  Return in about 7 weeks (around 07/24/2019). I spent 19 minutes involved in the care of this patient of which greater than 50% was spent discussing pregnancy, genetic testing, general overview of pregnancy as listed above.  All questions answered regarding ectopic pregnancy and miscarriage.  Finis Bud, M.D. 06/05/2019 4:29 PM

## 2019-06-12 ENCOUNTER — Other Ambulatory Visit: Payer: Self-pay | Admitting: Obstetrics and Gynecology

## 2019-06-12 ENCOUNTER — Other Ambulatory Visit: Payer: Self-pay

## 2019-06-12 ENCOUNTER — Ambulatory Visit (INDEPENDENT_AMBULATORY_CARE_PROVIDER_SITE_OTHER): Payer: Managed Care, Other (non HMO)

## 2019-06-12 DIAGNOSIS — N939 Abnormal uterine and vaginal bleeding, unspecified: Secondary | ICD-10-CM

## 2019-07-03 ENCOUNTER — Other Ambulatory Visit: Payer: Self-pay

## 2019-07-03 ENCOUNTER — Ambulatory Visit: Payer: Managed Care, Other (non HMO) | Admitting: Obstetrics and Gynecology

## 2019-07-03 VITALS — BP 129/78 | HR 86 | Ht 64.0 in | Wt 163.1 lb

## 2019-07-03 DIAGNOSIS — Z3481 Encounter for supervision of other normal pregnancy, first trimester: Secondary | ICD-10-CM

## 2019-07-03 DIAGNOSIS — Z113 Encounter for screening for infections with a predominantly sexual mode of transmission: Secondary | ICD-10-CM

## 2019-07-03 DIAGNOSIS — Z0283 Encounter for blood-alcohol and blood-drug test: Secondary | ICD-10-CM

## 2019-07-03 MED ORDER — BONJESTA 20-20 MG PO TBCR: 20.0000 mg | EXTENDED_RELEASE_TABLET | Freq: Every day | ORAL | 0 refills | Status: DC

## 2019-07-03 MED ORDER — PROMETHAZINE HCL 25 MG PO TABS: 25.0000 mg | ORAL_TABLET | Freq: Four times a day (QID) | ORAL | 2 refills | Status: DC | PRN

## 2019-07-03 NOTE — Patient Instructions (Signed)
WHAT OB PATIENTS CAN EXPECT   Confirmation of pregnancy and ultrasound ordered if medically indicated-[redacted] weeks gestation  New OB (NOB) intake with nurse and New OB (NOB) labs- [redacted] weeks gestation  New OB (NOB) physical examination with provider- 11/[redacted] weeks gestation  Flu vaccine-[redacted] weeks gestation  Anatomy scan-[redacted] weeks gestation  Glucose tolerance test, blood work to test for anemia, T-dap vaccine-[redacted] weeks gestation  Vaginal swabs/cultures-STD/Group B strep-[redacted] weeks gestation  Appointments every 4 weeks until 28 weeks  Every 2 weeks from 28 weeks until 36 weeks  Weekly visits from 36 weeks until delivery  Prenatal Care Prenatal care is health care during pregnancy. It helps you and your unborn baby (fetus) stay as healthy as possible. Prenatal care may be provided by a midwife, a family practice health care provider, or a childbirth and pregnancy specialist (obstetrician). How does this affect me? During pregnancy, you will be closely monitored for any new conditions that might develop. To lower your risk of pregnancy complications, you and your health care provider will talk about any underlying conditions you have. How does this affect my baby? Early and consistent prenatal care increases the chance that your baby will be healthy during pregnancy. Prenatal care lowers the risk that your baby will be:  Born early (prematurely).  Smaller than expected at birth (small for gestational age). What can I expect at the first prenatal care visit? Your first prenatal care visit will likely be the longest. You should schedule your first prenatal care visit as soon as you know that you are pregnant. Your first visit is a good time to talk about any questions or concerns you have about pregnancy. At your visit, you and your health care provider will talk about:  Your medical history, including: ? Any past pregnancies. ? Your family's medical history. ? The baby's father's medical history.  ? Any long-term (chronic) health conditions you have and how you manage them. ? Any surgeries or procedures you have had. ? Any current over-the-counter or prescription medicines, herbs, or supplements you are taking.  Other factors that could pose a risk to your baby, including:  Your home setting and your stress levels, including: ? Exposure to abuse or violence. ? Household financial strain. ? Mental health conditions you have.  Your daily health habits, including diet and exercise. Your health care provider will also:  Measure your weight, height, and blood pressure.  Do a physical exam, including a pelvic and breast exam.  Perform blood tests and urine tests to check for: ? Urinary tract infection. ? Sexually transmitted infections (STIs). ? Low iron levels in your blood (anemia). ? Blood type and certain proteins on red blood cells (Rh antibodies). ? Infections and immunity to viruses, such as hepatitis B and rubella. ? HIV (human immunodeficiency virus).  Do an ultrasound to confirm your baby's growth and development and to help predict your estimated due date (EDD). This ultrasound is done with a probe that is inserted into the vagina (transvaginal ultrasound).  Discuss your options for genetic screening.  Give you information about how to keep yourself and your baby healthy, including: ? Nutrition and taking vitamins. ? Physical activity. ? How to manage pregnancy symptoms such as nausea and vomiting (morning sickness). ? Infections and substances that may be harmful to your baby and how to avoid them. ? Food safety. ? Dental care. ? Working. ? Travel. ? Warning signs to watch for and when to call your health care provider. How often will  I have prenatal care visits? After your first prenatal care visit, you will have regular visits throughout your pregnancy. The visit schedule is often as follows:  Up to week 28 of pregnancy: once every 4 weeks.  28-36 weeks:  once every 2 weeks.  After 36 weeks: every week until delivery. Some women may have visits more or less often depending on any underlying health conditions and the health of the baby. Keep all follow-up and prenatal care visits as told by your health care provider. This is important. What happens during routine prenatal care visits? Your health care provider will:  Measure your weight and blood pressure.  Check for fetal heart sounds.  Measure the height of your uterus in your abdomen (fundal height). This may be measured starting around week 20 of pregnancy.  Check the position of your baby inside your uterus.  Ask questions about your diet, sleeping patterns, and whether you can feel the baby move.  Review warning signs to watch for and signs of labor.  Ask about any pregnancy symptoms you are having and how you are dealing with them. Symptoms may include: ? Headaches. ? Nausea and vomiting. ? Vaginal discharge. ? Swelling. ? Fatigue. ? Constipation. ? Any discomfort, including back or pelvic pain. Make a list of questions to ask your health care provider at your routine visits. What tests might I have during prenatal care visits? You may have blood, urine, and imaging tests throughout your pregnancy, such as:  Urine tests to check for glucose, protein, or signs of infection.  Glucose tests to check for a form of diabetes that can develop during pregnancy (gestational diabetes mellitus). This is usually done around week 24 of pregnancy.  An ultrasound to check your baby's growth and development and to check for birth defects. This is usually done around week 20 of pregnancy.  A test to check for group B strep (GBS) infection. This is usually done around week 36 of pregnancy.  Genetic testing. This may include blood or imaging tests, such as an ultrasound. Some genetic tests are done during the first trimester and some are done during the second trimester. What else can I  expect during prenatal care visits? Your health care provider may recommend getting certain vaccines during pregnancy. These may include:  A yearly flu shot (annual influenza vaccine). This is especially important if you will be pregnant during flu season.  Tdap (tetanus, diphtheria, pertussis) vaccine. Getting this vaccine during pregnancy can protect your baby from whooping cough (pertussis) after birth. This vaccine may be recommended between weeks 27 and 36 of pregnancy. Later in your pregnancy, your health care provider may give you information about:  Childbirth and breastfeeding classes.  Choosing a health care provider for your baby.  Umbilical cord banking.  Breastfeeding.  Birth control after your baby is born.  The hospital labor and delivery unit and how to tour it.  Registering at the hospital before you go into labor. Where to find more information  Office on Women's Health: LegalWarrants.gl  American Pregnancy Association: americanpregnancy.org  March of Dimes: marchofdimes.org Summary  Prenatal care helps you and your baby stay as healthy as possible during pregnancy.  Your first prenatal care visit will most likely be the longest.  You will have visits and tests throughout your pregnancy to monitor your health and your baby's health.  Bring a list of questions to your visits to ask your health care provider.  Make sure to keep all follow-up and prenatal  care visits with your health care provider. This information is not intended to replace advice given to you by your health care provider. Make sure you discuss any questions you have with your health care provider. Document Released: 10/05/2003 Document Revised: 01/22/2019 Document Reviewed: 10/01/2017 Elsevier Patient Education  [place]. Morning Sickness  Morning sickness is when you feel sick to your stomach (nauseous) during pregnancy. You may feel sick to your stomach and throw up (vomit).  You may feel sick in the morning, but you can feel this way at any time of day. Some women feel very sick to their stomach and cannot stop throwing up (hyperemesis gravidarum). Follow these instructions at home: Medicines  Take over-the-counter and prescription medicines only as told by your doctor. Do not take any medicines until you talk with your doctor about them first.  Taking multivitamins before getting pregnant can stop or lessen the harshness of morning sickness. Eating and drinking  Eat dry toast or crackers before getting out of bed.  Eat 5 or 6 small meals a day.  Eat dry and bland foods like rice and baked potatoes.  Do not eat greasy, fatty, or spicy foods.  Have someone cook for you if the smell of food causes you to feel sick or throw up.  If you feel sick to your stomach after taking prenatal vitamins, take them at night or with a snack.  Eat protein when you need a snack. Nuts, yogurt, and cheese are good choices.  Drink fluids throughout the day.  Try ginger ale made with real ginger, ginger tea made from fresh grated ginger, or ginger candies. General instructions  Do not use any products that have nicotine or tobacco in them, such as cigarettes and e-cigarettes. If you need help quitting, ask your doctor.  Use an air purifier to keep the air in your house free of smells.  Get lots of fresh air.  Try to avoid smells that make you feel sick.  Try: ? Wearing a bracelet that is used for seasickness (acupressure wristband). ? Going to a doctor who puts thin needles into certain body points (acupuncture) to improve how you feel. Contact a doctor if:  You need medicine to feel better.  You feel dizzy or light-headed.  You are losing weight. Get help right away if:  You feel very sick to your stomach and cannot stop throwing up.  You pass out (faint).  You have very bad pain in your belly. Summary  Morning sickness is when you feel sick to your  stomach (nauseous) during pregnancy.  You may feel sick in the morning, but you can feel this way at any time of day.  Making some changes to what you eat may help your symptoms go away. This information is not intended to replace advice given to you by your health care provider. Make sure you discuss any questions you have with your health care provider. Document Released: 11/09/2004 Document Revised: 09/14/2017 Document Reviewed: 11/02/2016 Elsevier Patient Education  [place]. Morning Sickness  Morning sickness is when you feel sick to your stomach (nauseous) during pregnancy. You may feel sick to your stomach and throw up (vomit). You may feel sick in the morning, but you can feel this way at any time of day. Some women feel very sick to their stomach and cannot stop throwing up (hyperemesis gravidarum). Follow these instructions at home: Medicines  Take over-the-counter and prescription medicines only as told by your doctor. Do not  take any medicines until you talk with your doctor about them first.  Taking multivitamins before getting pregnant can stop or lessen the harshness of morning sickness. Eating and drinking  Eat dry toast or crackers before getting out of bed.  Eat 5 or 6 small meals a day.  Eat dry and bland foods like rice and baked potatoes.  Do not eat greasy, fatty, or spicy foods.  Have someone cook for you if the smell of food causes you to feel sick or throw up.  If you feel sick to your stomach after taking prenatal vitamins, take them at night or with a snack.  Eat protein when you need a snack. Nuts, yogurt, and cheese are good choices.  Drink fluids throughout the day.  Try ginger ale made with real ginger, ginger tea made from fresh grated ginger, or ginger candies. General instructions  Do not use any products that have nicotine or tobacco in them, such as cigarettes and e-cigarettes. If you need help quitting, ask your doctor.  Use an  air purifier to keep the air in your house free of smells.  Get lots of fresh air.  Try to avoid smells that make you feel sick.  Try: ? Wearing a bracelet that is used for seasickness (acupressure wristband). ? Going to a doctor who puts thin needles into certain body points (acupuncture) to improve how you feel. Contact a doctor if:  You need medicine to feel better.  You feel dizzy or light-headed.  You are losing weight. Get help right away if:  You feel very sick to your stomach and cannot stop throwing up.  You pass out (faint).  You have very bad pain in your belly. Summary  Morning sickness is when you feel sick to your stomach (nauseous) during pregnancy.  You may feel sick in the morning, but you can feel this way at any time of day.  Making some changes to what you eat may help your symptoms go away. This information is not intended to replace advice given to you by your health care provider. Make sure you discuss any questions you have with your health care provider. Document Released: 11/09/2004 Document Revised: 09/14/2017 Document Reviewed: 11/02/2016 Elsevier Patient Education  2020 [place] [employer] Gain During Pregnancy, Adult A certain amount of weight gain during pregnancy is normal and healthy. How much weight you should gain depends on your overall health and a measurement called BMI (body mass index). BMI is an estimate of your body fat based on your height and weight. You can use an Freight forwarder to figure out your BMI, or you can ask your health care provider to calculate it for you at your next visit. Your recommended pregnancy weight gain is based on your pre-pregnancy BMI. General guidelines for a healthy total weight gain during pregnancy are listed below. If your BMI at or before the start of your pregnancy is:  Less than 18.5 (underweight), you should gain 28-40 lb (13-18 kg).  18.5-24.9 (normal weight), you should gain 25-35 lb  (11-16 kg).  25-29.9 (overweight), you should gain 15-25 lb (7-11 kg).  30 or higher (obese), you should gain 11-20 lb (5-9 kg). These ranges vary depending on your individual health. If you are carrying more than one baby (multiples), it may be safe to gain more weight than these recommendations. If you gain less weight than recommended, that may be safe as long as your baby is growing and developing normally. How can unhealthy  weight gain affect me and my baby? Gaining too much weight during pregnancy can lead to pregnancy complications, such as:  A temporary form of diabetes that develops during pregnancy (gestational diabetes).  High blood pressure during pregnancy and protein in your urine (preeclampsia).  High blood pressure during pregnancy without protein in your urine (gestational hypertension).  Your baby having a high weight at birth, which may: ? Raise your risk of having a more difficult delivery or a surgical delivery (cesarean delivery, or C-section). ? Raise your child's risk of developing obesity during childhood. Not gaining enough weight can be life-threatening for your baby, and it may raise your baby's chances of:  Being born early (preterm).  Growing more slowly than normal during pregnancy (growth restriction).  Having a low weight at birth. What actions can I take to gain a healthy amount of weight during pregnancy? General instructions  Keep track of your weight gain during pregnancy.  Take over-the-counter and prescription medicines only as told by your health care provider. Take all prenatal supplements as directed.  Keep all health care visits during pregnancy (prenatal visits). These visits are a good time to discuss your weight gain. Your health care provider will weigh you at each visit to make sure you are gaining a healthy amount of weight. Nutrition   Eat a balanced, nutrient-rich diet. Eat plenty of: ? Fruits and vegetables, such as berries and  broccoli. ? Whole grains, such as millet, barley, whole-wheat breads and cereals, and oatmeal. ? Low-fat dairy products or non-dairy products such as almond milk or rice milk. ? Protein foods, such as lean meat, chicken, eggs, and legumes (such as peas, beans, soybeans, and lentils).  Avoid foods that are fried or have a lot of fat, salt (sodium), or sugar.  Drink enough fluid to keep your urine pale yellow.  Choose healthy snack and drink options when you are at work or on the go: ? Drink water. Avoid soda, sports drinks, and juices that have added sugar. ? Avoid drinks with caffeine, such as coffee and energy drinks. ? Eat snacks that are high in protein, such as nuts, protein bars, and low-fat yogurt. ? Carry convenient snacks in your purse that do not need refrigeration, such as a pack of trail mix, an apple, or a granola bar.  If you need help improving your diet, work with a health care provider or a diet and nutrition specialist (dietitian). Activity   Exercise regularly, as told by your health care provider. ? If you were active before becoming pregnant, you may be able to continue your regular fitness activities. ? If you were not active before pregnancy, you may gradually build up to exercising for 30 or more minutes on most days of the week. This may include walking, swimming, or yoga.  Ask your health care provider what activities are safe for you. Talk with your health care provider about whether you may need to be excused from certain school or work activities. Where to find more information Learn more about managing your weight gain during pregnancy from:  American Pregnancy Association: www.americanpregnancy.org  U.S. Department of Agriculture pregnancy weight gain calculator: FormerBoss.no Summary  Too much weight gain during pregnancy can lead to complications for you and your baby.  Find out your pre-pregnancy BMI to determine how much weight gain is  healthy for you.  Eat nutritious foods and stay active.  Keep all of your prenatal visits as told by your health care provider. This information is  not intended to replace advice given to you by your health care provider. Make sure you discuss any questions you have with your health care provider. Document Released: 06/22/2017 Document Revised: 06/22/2017 Document Reviewed: 06/22/2017 Elsevier Patient Education  [place] of Pregnancy  The first trimester of pregnancy is from week 1 until the end of week 13 (months 1 through 3). During this time, your baby will begin to develop inside you. At 6-8 weeks, the eyes and face are formed, and the heartbeat can be seen on ultrasound. At the end of 12 weeks, all the baby's organs are formed. Prenatal care is all the medical care you receive before the birth of your baby. Make sure you get good prenatal care and follow all of your doctor's instructions. Follow these instructions at home: Medicines  Take over-the-counter and prescription medicines only as told by your doctor. Some medicines are safe and some medicines are not safe during pregnancy.  Take a prenatal vitamin that contains at least 600 micrograms (mcg) of folic acid.  If you have trouble pooping (constipation), take medicine that will make your stool soft (stool softener) if your doctor approves. Eating and drinking   Eat regular, healthy meals.  Your doctor will tell you the amount of weight gain that is right for you.  Avoid raw meat and uncooked cheese.  If you feel sick to your stomach (nauseous) or throw up (vomit): ? Eat 4 or 5 small meals a day instead of 3 large meals. ? Try eating a few soda crackers. ? Drink liquids between meals instead of during meals.  To prevent constipation: ? Eat foods that are high in fiber, like fresh fruits and vegetables, whole grains, and beans. ? Drink enough fluids to keep your pee (urine) clear or pale yellow.  Activity  Exercise only as told by your doctor. Stop exercising if you have cramps or pain in your lower belly (abdomen) or low back.  Do not exercise if it is too hot, too humid, or if you are in a place of great height (high altitude).  Try to avoid standing for long periods of time. Move your legs often if you must stand in one place for a long time.  Avoid heavy lifting.  Wear low-heeled shoes. Sit and stand up straight.  You can have sex unless your doctor tells you not to. Relieving pain and discomfort  Wear a good support bra if your breasts are sore.  Take warm water baths (sitz baths) to soothe pain or discomfort caused by hemorrhoids. Use hemorrhoid cream if your doctor says it is okay.  Rest with your legs raised if you have leg cramps or low back pain.  If you have puffy, bulging veins (varicose veins) in your legs: ? Wear support hose or compression stockings as told by your doctor. ? Raise (elevate) your feet for 15 minutes, 3-4 times a day. ? Limit salt in your food. Prenatal care  Schedule your prenatal visits by the twelfth week of pregnancy.  Write down your questions. Take them to your prenatal visits.  Keep all your prenatal visits as told by your doctor. This is important. Safety  Wear your seat belt at all times when driving.  Make a list of emergency phone numbers. The list should include numbers for family, friends, the hospital, and police and fire departments. General instructions  Ask your doctor for a referral to a local prenatal class. Begin classes no later than at the start  of month 6 of your pregnancy.  Ask for help if you need counseling or if you need help with nutrition. Your doctor can give you advice or tell you where to go for help.  Do not use hot tubs, steam rooms, or saunas.  Do not douche or use tampons or scented sanitary pads.  Do not cross your legs for long periods of time.  Avoid all herbs and alcohol. Avoid drugs that  are not approved by your doctor.  Do not use any tobacco products, including cigarettes, chewing tobacco, and electronic cigarettes. If you need help quitting, ask your doctor. You may get counseling or other support to help you quit.  Avoid cat litter boxes and soil used by cats. These carry germs that can cause birth defects in the baby and can cause a loss of your baby (miscarriage) or stillbirth.  Visit your dentist. At home, brush your teeth with a soft toothbrush. Be gentle when you floss. Contact a doctor if:  You are dizzy.  You have mild cramps or pressure in your lower belly.  You have a nagging pain in your belly area.  You continue to feel sick to your stomach, you throw up, or you have watery poop (diarrhea).  You have a bad smelling fluid coming from your vagina.  You have pain when you pee (urinate).  You have increased puffiness (swelling) in your face, hands, legs, or ankles. Get help right away if:  You have a fever.  You are leaking fluid from your vagina.  You have spotting or bleeding from your vagina.  You have very bad belly cramping or pain.  You gain or lose weight rapidly.  You throw up blood. It may look like coffee grounds.  You are around people who have [place] measles, fifth disease, or chickenpox.  You have a very bad headache.  You have shortness of breath.  You have any kind of trauma, such as from a fall or a car accident. Summary  The first trimester of pregnancy is from week 1 until the end of week 13 (months 1 through 3).  To take care of yourself and your unborn baby, you will need to eat healthy meals, take medicines only if your doctor tells you to do so, and do activities that are safe for you and your baby.  Keep all follow-up visits as told by your doctor. This is important as your doctor will have to ensure that your baby is healthy and growing well. This information is not intended to replace advice given to you by your  health care provider. Make sure you discuss any questions you have with your health care provider. Document Released: 03/20/2008 Document Revised: 01/23/2019 Document Reviewed: 10/10/2016 Elsevier Patient Education  2020 [employer]. Commonly Asked Questions During Pregnancy  Cats: A parasite can be excreted in cat feces.  To avoid exposure you need to have another person empty the little box.  If you must empty the litter box you will need to wear gloves.  Wash your hands after handling your cat.  This parasite can also be found in raw or undercooked meat so this should also be avoided.  Colds, Sore Throats, Flu: Please check your medication sheet to see what you can take for symptoms.  If your symptoms are unrelieved by these medications please call the office.  Dental Work: Most any dental work Investment banker, corporate recommends is permitted.  X-rays should only be taken during the first trimester if absolutely necessary.  Your abdomen should be shielded with a lead apron during all x-rays.  Please notify your provider prior to receiving any x-rays.  Novocaine is fine; gas is not recommended.  If your dentist requires a note from [place] prior to dental work please call the office and we will provide one for you.  Exercise: Exercise is an important part of staying healthy during your pregnancy.  You may continue most exercises you were accustomed to prior to pregnancy.  Later in your pregnancy you will most likely notice you have difficulty with activities requiring balance like riding a bicycle.  It is important that you listen to your body and avoid activities that put you at a higher risk of falling.  Adequate rest and staying well hydrated are a must!  If you have questions about the safety of specific activities ask your provider.    Exposure to Children with illness: Try to avoid obvious exposure; report any symptoms to [place] when noted,  If you have chicken pos, red measles or mumps, you should be immune to  these diseases.   Please do not take any vaccines while pregnant unless you have checked with your OB provider.  Fetal Movement: After 28 weeks we recommend you do "kick counts" twice daily.  Lie or sit down in a calm quiet environment and count your baby movements "kicks".  You should feel your baby at least 10 times per hour.  If you have not felt 10 kicks within the first hour get up, walk around and have something sweet to eat or drink then repeat for an additional hour.  If count remains less than 10 per hour notify your provider.  Fumigating: Follow your pest control agent's advice as to how long to stay out of your home.  Ventilate the area well before re-entering.  Hemorrhoids:   Most over-the-counter preparations can be used during pregnancy.  Check your medication to see what is safe to use.  It is important to use a stool softener or fiber in your diet and to drink lots of liquids.  If hemorrhoids seem to be getting worse please call the office.   Hot Tubs:  Hot tubs Jacuzzis and saunas are not recommended while pregnant.  These increase your internal body temperature and should be avoided.  Intercourse:  Sexual intercourse is safe during pregnancy as long as you are comfortable, unless otherwise advised by your provider.  Spotting may occur after intercourse; report any bright red bleeding that is heavier than spotting.  Labor:  If you know that you are in labor, please go to the hospital.  If you are unsure, please call the office and let us help you decide what to do.  Lifting, straining, etc:  If your job requires heavy lifting or straining please check with your provider for any limitations.  Generally, you should not lift items heavier than that you can lift simply with your hands and arms (no back muscles)  Painting:  Paint fumes do not harm your pregnancy, but may make you ill and should be avoided if possible.  Latex or water based paints have less odor than oils.  Use adequate  ventilation while painting.  Permanents & Hair Color:  Chemicals in hair dyes are not recommended as they cause increase hair dryness which can increase hair loss during pregnancy.  " Highlighting" and permanents are allowed.  Dye may be absorbed differently and permanents may not hold as well during pregnancy.  Sunbathing:  Use a sunscreen,  as skin burns easily during pregnancy.  Drink plenty of fluids; avoid over heating.  Tanning Beds:  Because their possible side effects are still unknown, tanning beds are not recommended.  Ultrasound Scans:  Routine ultrasounds are performed at approximately 20 weeks.  You will be able to see your baby's general anatomy an if you would like to know the gender this can usually be determined as well.  If it is questionable when you conceived you may also receive an ultrasound early in your pregnancy for dating purposes.  Otherwise ultrasound exams are not routinely performed unless there is a medical necessity.  Although you can request a scan we ask that you pay for it when conducted because insurance does not cover " patient request" scans.  Work: If your pregnancy proceeds without complications you may work until your due date, unless your physician or employer advises otherwise.  Round Ligament Pain/Pelvic Discomfort:  Sharp, shooting pains not associated with bleeding are fairly common, usually occurring in the second trimester of pregnancy.  They tend to be worse when standing up or when you remain standing for long periods of time.  These are the result of pressure of certain pelvic ligaments called "round ligaments".  Rest, Tylenol and heat seem to be the most effective relief.  As the womb and fetus grow, they rise out of the pelvis and the discomfort improves.  Please notify the office if your pain seems different than that described.  It may represent a more serious condition.  Common Medications Safe in Pregnancy  Acne:      Constipation:  Benzoyl  Peroxide     Colace  Clindamycin      Dulcolax Suppository  Topica Erythromycin     Fibercon  Salicylic Acid      Metamucil         Miralax AVOID:        Senakot   Accutane    Cough:  Retin-A       Cough Drops  Tetracycline      Phenergan w/ Codeine if Rx  Minocycline      Robitussin (Plain & DM)  Antibiotics:     Crabs/Lice:  Ceclor       RID  Cephalosporins    AVOID:  E-Mycins      Kwell  Keflex  Macrobid/Macrodantin   Diarrhea:  Penicillin      Kao-Pectate  Zithromax      Imodium AD         PUSH FLUIDS AVOID:       Cipro     Fever:  Tetracycline      Tylenol (Regular or Extra  Minocycline       Strength)  Levaquin      Extra Strength-Do not          Exceed 8 tabs/24 hrs Caffeine:        <295m/day (equiv. To 1 cup of coffee or  approx. 3 12 oz sodas)         Gas: Cold/Hayfever:       Gas-X  Benadryl      Mylicon  Claritin       Phazyme  **Claritin-D        Chlor-Trimeton    Headaches:  Dimetapp      ASA-Free Excedrin  Drixoral-Non-Drowsy     Cold Compress  Mucinex (Guaifenasin)     Tylenol (Regular or Extra  Sudafed/Sudafed-12 Hour     Strength)  **Sudafed PE Pseudoephedrine   Tylenol Cold &  Sinus     Vicks Vapor Rub  Zyrtec  **AVOID if Problems With Blood Pressure         Heartburn: Avoid lying down for at least 1 hour after meals  Aciphex      Maalox     Rash:  Milk of Magnesia     Benadryl    Mylanta       1% Hydrocortisone Cream  Pepcid  Pepcid Complete   Sleep Aids:  Prevacid      Ambien   Prilosec       Benadryl  Rolaids       Chamomile Tea  Tums (Limit 4/day)     Unisom  Zantac       Tylenol PM         Warm milk-add vanilla or  Hemorrhoids:       Sugar for taste  Anusol/Anusol H.C.  (RX: Analapram 2.5%)  Sugar Substitutes:  Hydrocortisone OTC     Ok in moderation  Preparation H      Tucks        Vaseline lotion applied to tissue with wiping    Herpes:     Throat:  Acyclovir      Oragel  Famvir  Valtrex     Vaccines:         Flu  Shot Leg Cramps:       *Gardasil  Benadryl      Hepatitis A         Hepatitis B Nasal Spray:       Pneumovax  Saline Nasal Spray     Polio Booster         Tetanus Nausea:       Tuberculosis test or PPD  Vitamin B6 25 mg TID   AVOID:    Dramamine      *Gardasil  Emetrol       Live Poliovirus  Ginger Root 250 mg QID    MMR (measles, mumps &  High Complex Carbs @ Bedtime    rebella)  Sea Bands-Accupressure    Varicella (Chickenpox)  Unisom 1/2 tab TID     *No known complications           If received before Pain:         Known pregnancy;   Darvocet       Resume series after  Lortab        Delivery  Percocet    Yeast:   Tramadol      Femstat  Tylenol 3      Gyne-lotrimin  Ultram       Monistat  Vicodin           MISC:         All Sunscreens           Hair Coloring/highlights          Insect Repellant's          (Including DEET)         Mystic Tans

## 2019-07-03 NOTE — Progress Notes (Unsigned)
      Christy Farmer presents for NOB nurse intake visit. Pregnancy confirmation done at [facility], 06/05/19, with Harlin Heys.  G2.  P1001.  LMP  04/30/19.  EDD 01/26/2020.  Ga38w3d. Pregnancy education material explained and given.  0 cats in the home.  NOB labs ordered. BMI greater less  30. TSH/HbgA1c not ordered. Sickle cell ordered due to race. HIV and drug screen explained and ordered Genetic screening discussed. Genetic testing; Unsure. Pt to discuss genetic testing with provider. PNV encouraged. Pt to follow up with provider in 2 weeks for NOB physical. [place] and FMLA form reviewed, completed and signed.

## 2019-07-04 LAB — MICROSCOPIC EXAMINATION: Casts: NONE SEEN /lpf

## 2019-07-04 LAB — RUBELLA SCREEN: Rubella Antibodies, IGG: 1.59 index (ref >0.99)

## 2019-07-04 LAB — TOXOPLASMA ANTIBODIES- IGG AND  IGM
Toxoplasma Antibody- IgM: <3 AU/mL (ref 0.0–7.9)
Toxoplasma IgG Ratio: 301 IU/mL — ABNORMAL HIGH (ref 0.0–7.1)

## 2019-07-04 LAB — HEPATITIS B SURFACE ANTIGEN: Hepatitis B Surface Ag: NEGATIVE

## 2019-07-04 LAB — DRUG PROFILE, UR, 9 DRUGS (LABCORP)
Amphetamines, Urine: NEGATIVE ng/mL
Barbiturate Quant, Ur: NEGATIVE ng/mL
Benzodiazepine Quant, Ur: NEGATIVE ng/mL
Cannabinoid Quant, Ur: NEGATIVE ng/mL
Cocaine (Metab.): NEGATIVE ng/mL
Methadone Screen, Urine: NEGATIVE ng/mL
Opiate Quant, Ur: NEGATIVE ng/mL
PCP Quant, Ur: NEGATIVE ng/mL
Propoxyphene: NEGATIVE ng/mL

## 2019-07-04 LAB — URINALYSIS, ROUTINE W REFLEX MICROSCOPIC
Bilirubin, UA: NEGATIVE
Glucose, UA: NEGATIVE
Ketones, UA: NEGATIVE
Nitrite, UA: NEGATIVE
Protein,UA: NEGATIVE
RBC, UA: NEGATIVE
Specific Gravity, UA: 1.016 (ref 1.005–1.030)
Urobilinogen, Ur: 0.2 mg/dL (ref 0.2–1.0)
pH, UA: 6 (ref 5.0–7.5)

## 2019-07-04 LAB — ANTIBODY SCREEN: Antibody Screen: NEGATIVE

## 2019-07-04 LAB — VARICELLA ZOSTER ANTIBODY, IGG: Varicella zoster IgG: 2824 index (ref >165)

## 2019-07-04 LAB — NICOTINE SCREEN, URINE: Cotinine Ql Scrn, Ur: NEGATIVE ng/mL

## 2019-07-04 LAB — HGB SOLU + RFLX FRAC: Sickle Solubility Test - HGBRFX: NEGATIVE

## 2019-07-04 LAB — RPR: RPR Ser Ql: NONREACTIVE

## 2019-07-04 LAB — ABO AND RH: Rh Factor: POSITIVE

## 2019-07-04 LAB — HIV ANTIBODY (ROUTINE TESTING W REFLEX): HIV Screen 4th Generation wRfx: NONREACTIVE

## 2019-07-05 LAB — GC/CHLAMYDIA PROBE AMP
Chlamydia trachomatis, NAA: NEGATIVE
Neisseria Gonorrhoeae by PCR: NEGATIVE

## 2019-07-05 LAB — CULTURE, OB URINE

## 2019-07-05 LAB — URINE CULTURE, OB REFLEX: Organism ID, Bacteria: NO GROWTH

## 2019-07-17 ENCOUNTER — Ambulatory Visit (INDEPENDENT_AMBULATORY_CARE_PROVIDER_SITE_OTHER): Payer: Managed Care, Other (non HMO) | Admitting: Obstetrics and Gynecology

## 2019-07-17 ENCOUNTER — Other Ambulatory Visit: Payer: Self-pay

## 2019-07-17 ENCOUNTER — Encounter: Payer: Self-pay | Admitting: Obstetrics and Gynecology

## 2019-07-17 VITALS — BP 131/76 | HR 93 | Wt 163.0 lb

## 2019-07-17 DIAGNOSIS — Z3481 Encounter for supervision of other normal pregnancy, first trimester: Secondary | ICD-10-CM

## 2019-07-17 NOTE — Progress Notes (Signed)
NOB: Desires genetic testing.  Reports no complaints.  Taking vitamins as directed.  Desires gender reveal.  Breast-feeding education begun.  Ready set baby literature given.  Website discussed.  History of ASCUS Pap-will delay colposcopy till 3 months postpartum.  Physical examination General NAD, Conversant  HEENT Atraumatic; Op clear with mmm.  Normo-cephalic. Pupils reactive. Anicteric sclerae  Thyroid/Neck Smooth without nodularity or enlargement. Normal ROM.  Neck Supple.  Skin No rashes, lesions or ulceration. Normal palpated skin turgor. No nodularity.  Breasts: No masses or discharge.  Symmetric.  No axillary adenopathy.  Lungs: Clear to auscultation.No rales or wheezes. Normal Respiratory effort, no retractions.  Heart: NSR.  No murmurs or rubs appreciated. No periferal edema  Abdomen: Soft.  Non-tender.  No masses.  No HSM. No hernia  Extremities: Moves all appropriately.  Normal ROM for age. No lymphadenopathy.  Neuro: Oriented to PPT.  Normal mood. Normal affect.     Pelvic:   Vulva: Normal appearance.  No lesions.  Vagina: No lesions or abnormalities noted.  Support: Normal pelvic support.  Urethra No masses tenderness or scarring.  Meatus Normal size without lesions or prolapse.  Cervix: Normal appearance.  No lesions.  Anus: Normal exam.  No lesions.  Perineum: Normal exam.  No lesions.        Bimanual   Adnexae: No masses.  Non-tender to palpation.  Uterus: Enlarged. 12wks  POS FHTs 154  Non-tender.  Mobile.  AV.  Adnexae: No masses.  Non-tender to palpation.  Cul-de-sac: Negative for abnormality.  Adnexae: No masses.  Non-tender to palpation.         Pelvimetry   Diagonal: Reached.  Spines: Average.  Sacrum: Concave.  Pubic Arch: Normal.

## 2019-07-17 NOTE — Progress Notes (Signed)
Patient comes in today for new OB physical. She is taking prenatal vitamin. She would like genetic testing.

## 2019-07-22 ENCOUNTER — Encounter: Payer: Managed Care, Other (non HMO) | Admitting: Obstetrics and Gynecology

## 2019-07-23 ENCOUNTER — Encounter: Payer: Managed Care, Other (non HMO) | Admitting: Obstetrics and Gynecology

## 2019-07-23 LAB — MATERNIT21  PLUS CORE+ESS+SCA, BLOOD

## 2019-08-15 ENCOUNTER — Encounter: Payer: Self-pay | Admitting: Obstetrics and Gynecology

## 2019-08-15 ENCOUNTER — Other Ambulatory Visit: Payer: Self-pay

## 2019-08-15 ENCOUNTER — Ambulatory Visit (INDEPENDENT_AMBULATORY_CARE_PROVIDER_SITE_OTHER): Payer: Managed Care, Other (non HMO) | Admitting: Obstetrics and Gynecology

## 2019-08-15 VITALS — BP 108/62 | HR 92 | Wt 166.5 lb

## 2019-08-15 DIAGNOSIS — Z3482 Encounter for supervision of other normal pregnancy, second trimester: Secondary | ICD-10-CM

## 2019-08-15 DIAGNOSIS — Z3A17 17 weeks gestation of pregnancy: Secondary | ICD-10-CM

## 2019-08-15 DIAGNOSIS — Z1379 Encounter for other screening for genetic and chromosomal anomalies: Secondary | ICD-10-CM

## 2019-08-15 LAB — POCT URINALYSIS DIPSTICK OB
Bilirubin, UA: NEGATIVE
Blood, UA: NEGATIVE
Glucose, UA: NEGATIVE
Ketones, UA: NEGATIVE
Leukocytes, UA: NEGATIVE
Nitrite, UA: NEGATIVE
POC,PROTEIN,UA: NEGATIVE
Spec Grav, UA: 1.02 (ref 1.010–1.025)
Urobilinogen, UA: 0.2 E.U./dL
pH, UA: 7 (ref 5.0–8.0)

## 2019-08-15 NOTE — Progress Notes (Signed)
[name] present for routine prenatal care. Pt stated having lower abd pain off and on. Pt declined flu vaccine.

## 2019-08-15 NOTE — Patient Instructions (Signed)

## 2019-08-15 NOTE — Progress Notes (Signed)
ROB: Notes mild nausea but managing with ginger. Also noting some occasional mild lower abdominal cramping. Denies bleeding.  Reports issues of hemorrhoids, using OTC suppositories. Normal MaterniT21, for spina bifida screen today. Declined flu vaccine.  RTC in 4 weeks. For anatomy scan at that time.

## 2019-08-15 NOTE — Progress Notes (Signed)
Presents for pre-natal care she is 16wk4d GA. She states overall doing well. Reports mild nausea managed with ginger. She also is having worsening of hemorrhoids. Reports adequate water intake, does have increased straining with bowel movements for past few days.

## 2019-08-20 LAB — AFP, SERUM, OPEN SPINA BIFIDA
AFP MoM: 0.7
AFP Value: 22.9 ng/mL
Gest. Age on Collection Date: 16.4 weeks
Maternal Age At EDD: 33.9 yr
OSBR Risk 1 IN: 10000
Test Results:: NEGATIVE
Weight: 167 [lb_av]

## 2019-09-04 ENCOUNTER — Other Ambulatory Visit: Payer: Self-pay

## 2019-09-04 ENCOUNTER — Ambulatory Visit (INDEPENDENT_AMBULATORY_CARE_PROVIDER_SITE_OTHER): Payer: Managed Care, Other (non HMO) | Admitting: Obstetrics and Gynecology

## 2019-09-04 ENCOUNTER — Ambulatory Visit (INDEPENDENT_AMBULATORY_CARE_PROVIDER_SITE_OTHER): Payer: Managed Care, Other (non HMO)

## 2019-09-04 VITALS — BP 122/73 | HR 69 | Wt 171.2 lb

## 2019-09-04 DIAGNOSIS — Z363 Encounter for antenatal screening for malformations: Secondary | ICD-10-CM

## 2019-09-04 DIAGNOSIS — Z3A19 19 weeks gestation of pregnancy: Secondary | ICD-10-CM

## 2019-09-04 DIAGNOSIS — Z3482 Encounter for supervision of other normal pregnancy, second trimester: Secondary | ICD-10-CM

## 2019-09-04 LAB — POCT URINALYSIS DIPSTICK OB
Bilirubin, UA: NEGATIVE
Blood, UA: NEGATIVE
Glucose, UA: NEGATIVE
Ketones, UA: NEGATIVE
Leukocytes, UA: NEGATIVE
Nitrite, UA: NEGATIVE
POC,PROTEIN,UA: NEGATIVE
Spec Grav, UA: 1.01 (ref 1.010–1.025)
Urobilinogen, UA: 0.2 E.U./dL
pH, UA: 7 (ref 5.0–8.0)

## 2019-09-04 NOTE — Progress Notes (Signed)
ROB: No complaints.  Feels occasional fetal movement.  Had her gender reveal party (gilr).  Ultrasound today reveals normal FAS.

## 2019-09-16 ENCOUNTER — Encounter: Payer: Managed Care, Other (non HMO) | Admitting: Obstetrics and Gynecology

## 2019-09-16 ENCOUNTER — Other Ambulatory Visit: Payer: Managed Care, Other (non HMO)

## 2019-10-01 ENCOUNTER — Ambulatory Visit (INDEPENDENT_AMBULATORY_CARE_PROVIDER_SITE_OTHER): Payer: Managed Care, Other (non HMO) | Admitting: Obstetrics and Gynecology

## 2019-10-01 ENCOUNTER — Other Ambulatory Visit: Payer: Self-pay

## 2019-10-01 ENCOUNTER — Encounter: Payer: Self-pay | Admitting: Obstetrics and Gynecology

## 2019-10-01 VITALS — BP 108/69 | HR 96 | Wt 178.1 lb

## 2019-10-01 DIAGNOSIS — Z3482 Encounter for supervision of other normal pregnancy, second trimester: Secondary | ICD-10-CM

## 2019-10-01 DIAGNOSIS — Z130 Encounter for screening for diseases of the blood and blood-forming organs and certain disorders involving the immune mechanism: Secondary | ICD-10-CM

## 2019-10-01 DIAGNOSIS — O26892 Other specified pregnancy related conditions, second trimester: Secondary | ICD-10-CM

## 2019-10-01 DIAGNOSIS — N760 Acute vaginitis: Secondary | ICD-10-CM

## 2019-10-01 DIAGNOSIS — B9689 Other specified bacterial agents as the cause of diseases classified elsewhere: Secondary | ICD-10-CM | POA: Diagnosis not present

## 2019-10-01 DIAGNOSIS — Z131 Encounter for screening for diabetes mellitus: Secondary | ICD-10-CM

## 2019-10-01 DIAGNOSIS — Z3A23 23 weeks gestation of pregnancy: Secondary | ICD-10-CM

## 2019-10-01 LAB — POCT URINALYSIS DIPSTICK OB
Bilirubin, UA: NEGATIVE
Blood, UA: NEGATIVE
Glucose, UA: NEGATIVE
Ketones, UA: NEGATIVE
Leukocytes, UA: NEGATIVE
Nitrite, UA: NEGATIVE
POC,PROTEIN,UA: NEGATIVE
Spec Grav, UA: 1.01 (ref 1.010–1.025)
Urobilinogen, UA: 0.2 E.U./dL
pH, UA: 7 (ref 5.0–8.0)

## 2019-10-01 MED ORDER — METRONIDAZOLE 500 MG PO TABS: 500.0000 mg | ORAL_TABLET | Freq: Two times a day (BID) | ORAL | 0 refills | Status: AC

## 2019-10-01 NOTE — Patient Instructions (Addendum)
Bacterial Vaginosis  Bacterial vaginosis is a vaginal infection that occurs when the normal balance of bacteria in the vagina is disrupted. It results from an overgrowth of certain bacteria. This is the most common vaginal infection among women ages 15-44. Because bacterial vaginosis increases your risk for STIs (sexually transmitted infections), getting treated can help reduce your risk for chlamydia, gonorrhea, herpes, and HIV (human immunodeficiency virus). Treatment is also important for preventing complications in pregnant women, because this condition can cause an early (premature) delivery. What are the causes? This condition is caused by an increase in harmful bacteria that are normally present in small amounts in the vagina. However, the reason that the condition develops is not fully understood. What increases the risk? The following factors may make you more likely to develop this condition:  Having a new sexual partner or multiple sexual partners.  Having unprotected sex.  Douching.  Having an intrauterine device (IUD).  Smoking.  Drug and alcohol abuse.  Taking certain antibiotic medicines.  Being pregnant. You cannot get bacterial vaginosis from toilet seats, bedding, swimming pools, or contact with objects around you. What are the signs or symptoms? Symptoms of this condition include:  Grey or white vaginal discharge. The discharge can also be watery or foamy.  A fish-like odor with discharge, especially after sexual intercourse or during menstruation.  Itching in and around the vagina.  Burning or pain with urination. Some women with bacterial vaginosis have no signs or symptoms. How is this diagnosed? This condition is diagnosed based on:  Your medical history.  A physical exam of the vagina.  Testing a sample of vaginal fluid under a microscope to look for a large amount of bad bacteria or abnormal cells. Your health care provider may use a cotton swab or  a small wooden spatula to collect the sample. How is this treated? This condition is treated with antibiotics. These may be given as a pill, a vaginal cream, or a medicine that is put into the vagina (suppository). If the condition comes back after treatment, a second round of antibiotics may be needed. Follow these instructions at home: Medicines  Take over-the-counter and prescription medicines only as told by your health care provider.  Take or use your antibiotic as told by your health care provider. Do not stop taking or using the antibiotic even if you start to feel better. General instructions  If you have a female sexual partner, tell her that you have a vaginal infection. She should see her health care provider and be treated if she has symptoms. If you have a female sexual partner, he does not need treatment.  During treatment: ? Avoid sexual activity until you finish treatment. ? Do not douche. ? Avoid alcohol as directed by your health care provider. ? Avoid breastfeeding as directed by your health care provider.  Drink enough water and fluids to keep your urine clear or pale yellow.  Keep the area around your vagina and rectum clean. ? Wash the area daily with warm water. ? Wipe yourself from front to back after using the toilet.  Keep all follow-up visits as told by your health care provider. This is important. How is this prevented?  Do not douche.  Wash the outside of your vagina with warm water only.  Use protection when having sex. This includes latex condoms and dental dams.  Limit how many sexual partners you have. To help prevent bacterial vaginosis, it is best to have sex with just one partner (  monogamous).  Make sure you and your sexual partner are tested for STIs.  Wear cotton or cotton-lined underwear.  Avoid wearing tight pants and pantyhose, especially during summer.  Limit the amount of alcohol that you drink.  Do not use any products that contain  nicotine or tobacco, such as cigarettes and e-cigarettes. If you need help quitting, ask your health care provider.  Do not use illegal drugs. Where to find more information  Centers for Disease Control and Prevention: AppraiserFraud.fi  American Sexual Health Association (ASHA): www.ashastd.org  [place] Department of Health and Financial controller, Office on Women's Health: DustingSprays.pl or SecuritiesCard.it Contact a health care provider if:  Your symptoms do not improve, even after treatment.  You have more discharge or pain when urinating.  You have a fever.  You have pain in your abdomen.  You have pain during sex.  You have vaginal bleeding between periods. Summary  Bacterial vaginosis is a vaginal infection that occurs when the normal balance of bacteria in the vagina is disrupted.  Because bacterial vaginosis increases your risk for STIs (sexually transmitted infections), getting treated can help reduce your risk for chlamydia, gonorrhea, herpes, and HIV (human immunodeficiency virus). Treatment is also important for preventing complications in pregnant women, because the condition can cause an early (premature) delivery.  This condition is treated with antibiotic medicines. These may be given as a pill, a vaginal cream, or a medicine that is put into the vagina (suppository). This information is not intended to replace advice given to you by your health care provider. Make sure you discuss any questions you have with your health care provider. Document Released: 10/02/2005 Document Revised: 09/14/2017 Document Reviewed: 06/17/2016 Elsevier Patient Education  [place] of Pregnancy The second trimester is from week 14 through week 27 (months 4 through 6). The second trimester is often a time when you feel your best. Your body has adjusted to being pregnant, and you begin to feel better physically.  Usually, morning sickness has lessened or quit completely, you may have more energy, and you may have an increase in appetite. The second trimester is also a time when the fetus is growing rapidly. At the end of the sixth month, the fetus is about 9 inches long and weighs about 1 pounds. You will likely begin to feel the baby move (quickening) between 16 and 20 weeks of pregnancy. Body changes during your second trimester Your body continues to go through many changes during your second trimester. The changes vary from woman to woman.  Your weight will continue to increase. You will notice your lower abdomen bulging out.  You may begin to get stretch marks on your hips, abdomen, and breasts.  You may develop headaches that can be relieved by medicines. The medicines should be approved by your health care provider.  You may urinate more often because the fetus is pressing on your bladder.  You may develop or continue to have heartburn as a result of your pregnancy.  You may develop constipation because certain hormones are causing the muscles that push waste through your intestines to slow down.  You may develop hemorrhoids or swollen, bulging veins (varicose veins).  You may have back pain. This is caused by: ? Weight gain. ? Pregnancy hormones that are relaxing the joints in your pelvis. ? A shift in weight and the muscles that support your balance.  Your breasts will continue to grow and they will continue to become  tender.  Your gums may bleed and may be sensitive to brushing and flossing.  Dark spots or blotches (chloasma, mask of pregnancy) may develop on your face. This will likely fade after the baby is born.  A dark line from your belly button to the pubic area (linea nigra) may appear. This will likely fade after the baby is born.  You may have changes in your hair. These can include thickening of your hair, rapid growth, and changes in texture. Some women also have hair loss  during or after pregnancy, or hair that feels dry or thin. Your hair will most likely return to normal after your baby is born. What to expect at prenatal visits During a routine prenatal visit:  You will be weighed to make sure you and the fetus are growing normally.  Your blood pressure will be taken.  Your abdomen will be measured to track your baby's growth.  The fetal heartbeat will be listened to.  Any test results from the previous visit will be discussed. Your health care provider may ask you:  How you are feeling.  If you are feeling the baby move.  If you have had any abnormal symptoms, such as leaking fluid, bleeding, severe headaches, or abdominal cramping.  If you are using any tobacco products, including cigarettes, chewing tobacco, and electronic cigarettes.  If you have any questions. Other tests that may be performed during your second trimester include:  Blood tests that check for: ? Low iron levels (anemia). ? High blood sugar that affects pregnant women (gestational diabetes) between 57 and 28 weeks. ? Rh antibodies. This is to check for a protein on red blood cells (Rh factor).  Urine tests to check for infections, diabetes, or protein in the urine.  An ultrasound to confirm the proper growth and development of the baby.  An amniocentesis to check for possible genetic problems.  Fetal screens for spina bifida and Down syndrome.  HIV (human immunodeficiency virus) testing. Routine prenatal testing includes screening for HIV, unless you choose not to have this test. Follow these instructions at home: Medicines  Follow your health care provider's instructions regarding medicine use. Specific medicines may be either safe or unsafe to take during pregnancy.  Take a prenatal vitamin that contains at least 600 micrograms (mcg) of folic acid.  If you develop constipation, try taking a stool softener if your health care provider approves. Eating and  drinking   Eat a balanced diet that includes fresh fruits and vegetables, whole grains, good sources of protein such as meat, eggs, or tofu, and low-fat dairy. Your health care provider will help you determine the amount of weight gain that is right for you.  Avoid raw meat and uncooked cheese. These carry germs that can cause birth defects in the baby.  If you have low calcium intake from food, talk to your health care provider about whether you should take a daily calcium supplement.  Limit foods that are high in fat and processed sugars, such as fried and sweet foods.  To prevent constipation: ? Drink enough fluid to keep your urine clear or pale yellow. ? Eat foods that are high in fiber, such as fresh fruits and vegetables, whole grains, and beans. Activity  Exercise only as directed by your health care provider. Most women can continue their usual exercise routine during pregnancy. Try to exercise for 30 minutes at least 5 days a week. Stop exercising if you experience uterine contractions.  Avoid heavy lifting, wear  low heel shoes, and practice good posture.  A sexual relationship may be continued unless your health care provider directs you otherwise. Relieving pain and discomfort  Wear a good support bra to prevent discomfort from breast tenderness.  Take warm sitz baths to soothe any pain or discomfort caused by hemorrhoids. Use hemorrhoid cream if your health care provider approves.  Rest with your legs elevated if you have leg cramps or low back pain.  If you develop varicose veins, wear support hose. Elevate your feet for 15 minutes, 3-4 times a day. Limit salt in your diet. Prenatal Care  Write down your questions. Take them to your prenatal visits.  Keep all your prenatal visits as told by your health care provider. This is important. Safety  Wear your seat belt at all times when driving.  Make a list of emergency phone numbers, including numbers for family,  friends, the hospital, and police and fire departments. General instructions  Ask your health care provider for a referral to a local prenatal education class. Begin classes no later than the beginning of month 6 of your pregnancy.  Ask for help if you have counseling or nutritional needs during pregnancy. Your health care provider can offer advice or refer you to specialists for help with various needs.  Do not use hot tubs, steam rooms, or saunas.  Do not douche or use tampons or scented sanitary pads.  Do not cross your legs for long periods of time.  Avoid cat litter boxes and soil used by cats. These carry germs that can cause birth defects in the baby and possibly loss of the fetus by miscarriage or stillbirth.  Avoid all smoking, herbs, alcohol, and unprescribed drugs. Chemicals in these products can affect the formation and growth of the baby.  Do not use any products that contain nicotine or tobacco, such as cigarettes and e-cigarettes. If you need help quitting, ask your health care provider.  Visit your dentist if you have not gone yet during your pregnancy. Use a soft toothbrush to brush your teeth and be gentle when you floss. Contact a health care provider if:  You have dizziness.  You have mild pelvic cramps, pelvic pressure, or nagging pain in the abdominal area.  You have persistent nausea, vomiting, or diarrhea.  You have a bad smelling vaginal discharge.  You have pain when you urinate. Get help right away if:  You have a fever.  You are leaking fluid from your vagina.  You have spotting or bleeding from your vagina.  You have severe abdominal cramping or pain.  You have rapid weight gain or weight loss.  You have shortness of breath with chest pain.  You notice sudden or extreme swelling of your face, hands, ankles, feet, or legs.  You have not felt your baby move in over an hour.  You have severe headaches that do not go away when you take  medicine.  You have vision changes. Summary  The second trimester is from week 14 through week 27 (months 4 through 6). It is also a time when the fetus is growing rapidly.  Your body goes through many changes during pregnancy. The changes vary from woman to woman.  Avoid all smoking, herbs, alcohol, and unprescribed drugs. These chemicals affect the formation and growth your baby.  Do not use any tobacco products, such as cigarettes, chewing tobacco, and e-cigarettes. If you need help quitting, ask your health care provider.  Contact your health care provider if you  have any questions. Keep all prenatal visits as told by your health care provider. This is important. This information is not intended to replace advice given to you by your health care provider. Make sure you discuss any questions you have with your health care provider. Document Released: 09/26/2001 Document Revised: 01/24/2019 Document Reviewed: 11/07/2016 Elsevier Patient Education  2020 [employer].

## 2019-10-01 NOTE — Progress Notes (Signed)
[name] present for routine prenatal care. Pt stated that she was having lower back pain, no blooding, no contractions and fetal movement present. Pt stated having discharge with odor.

## 2019-10-01 NOTE — Progress Notes (Signed)
ROB: Patient complains of vaginal discharge with odor. Wet prep done, BV infection noted, will prescribe Flagyl.  Notes concerns regarding feeling fetal movement in lower pelvis. Discussed that she may not feel much in her upper abdomen as she also has an anterior placenta. Denies cramping or contractions.  RTC in 4 weeks, for 28 week labs at that time.

## 2019-10-03 ENCOUNTER — Encounter: Payer: Managed Care, Other (non HMO) | Admitting: Obstetrics and Gynecology

## 2019-10-17 NOTE — L&D Delivery Note (Addendum)
Delivery Summary for Christy Farmer  Labor Events:   Preterm labor: No data found  Rupture date: 01/16/2020  Rupture time: 1:00 PM  Rupture type: Spontaneous  Fluid Color: Clear  Induction: No data found  Augmentation: No data found  Complications: No data found  Cervical ripening: No data found No data found   No data found     Delivery:   Episiotomy: No data found  Lacerations: No data found  Repair suture: No data found  Repair # of packets: No data found  Blood loss (ml): 375   Information for the patient's newborn:  Christy Farmer, Girl [name] [[id number]]    Delivery 01/18/2020 4:45 AM by  Vaginal, Spontaneous Sex:  female Gestational Age: [redacted]w[redacted]d Delivery Clinician:   Living?:         APGARS  One minute Five minutes Ten minutes  Skin color:        Heart rate:        Grimace:        Muscle tone:        Breathing:        Totals: 8  9      Presentation/position:      Resuscitation:   Cord information:    Disposition of cord blood:     Blood gases sent?  Complications:   Placenta: Delivered:       appearance Newborn Measurements: Weight: 6 lb 13.2 oz (3095 g)  Height: 19.88"  Head circumference:    Chest circumference:    Other providers:    Additional  information: Forceps:   Vacuum:   Breech:   Observed anomalies       Delivery Note At 4:45 AM a viable and healthy female was delivered via Vaginal, Spontaneous (Presentation: Right Occiput Anterior) with a precipitous labor.  APGAR: 8, 9; weight 3095 grams .   Placenta status: Spontaneous, Intact.  Cord: 3 vessels with the following complications: Body cord x 1, reduced after delivery of the fetus.  Cord pH: not obtained.  Delayed cord clamping observed  Anesthesia: None Episiotomy: None Lacerations: 1st degree perineal. Hemostatic.  Suture Repair: None Est. Blood Loss (mL):  375  Mom to postpartum.  Baby to Couplet care / Skin to Skin.   Hildred Laser, MD 01/18/2020, 5:06 AM

## 2019-10-28 ENCOUNTER — Encounter: Payer: Self-pay | Admitting: Surgical

## 2019-10-29 ENCOUNTER — Ambulatory Visit (INDEPENDENT_AMBULATORY_CARE_PROVIDER_SITE_OTHER): Payer: Managed Care, Other (non HMO) | Admitting: Obstetrics and Gynecology

## 2019-10-29 ENCOUNTER — Other Ambulatory Visit: Payer: Managed Care, Other (non HMO)

## 2019-10-29 ENCOUNTER — Encounter: Payer: Self-pay | Admitting: Obstetrics and Gynecology

## 2019-10-29 ENCOUNTER — Other Ambulatory Visit: Payer: Self-pay | Admitting: Obstetrics and Gynecology

## 2019-10-29 ENCOUNTER — Encounter: Payer: Managed Care, Other (non HMO) | Admitting: Obstetrics and Gynecology

## 2019-10-29 ENCOUNTER — Other Ambulatory Visit: Payer: Self-pay

## 2019-10-29 VITALS — BP 117/76 | HR 88 | Wt 180.5 lb

## 2019-10-29 DIAGNOSIS — Z130 Encounter for screening for diseases of the blood and blood-forming organs and certain disorders involving the immune mechanism: Secondary | ICD-10-CM

## 2019-10-29 DIAGNOSIS — Z131 Encounter for screening for diabetes mellitus: Secondary | ICD-10-CM

## 2019-10-29 DIAGNOSIS — Z3A27 27 weeks gestation of pregnancy: Secondary | ICD-10-CM

## 2019-10-29 DIAGNOSIS — Z3482 Encounter for supervision of other normal pregnancy, second trimester: Secondary | ICD-10-CM

## 2019-10-29 LAB — POCT URINALYSIS DIPSTICK OB
Bilirubin, UA: NEGATIVE
Blood, UA: NEGATIVE
Glucose, UA: NEGATIVE
Ketones, UA: NEGATIVE
Leukocytes, UA: NEGATIVE
Nitrite, UA: NEGATIVE
Spec Grav, UA: 1.01 (ref 1.010–1.025)
Urobilinogen, UA: 0.2 E.U./dL
pH, UA: 7 (ref 5.0–8.0)

## 2019-10-29 NOTE — Progress Notes (Signed)
Patient comes in today for ROB visit. No concerns today. Patient would like to wait until next visit on Tdap.

## 2019-10-29 NOTE — Progress Notes (Signed)
ROB:  No complaints.  1 hr GCT today.  Daily fetal movement.

## 2019-10-30 LAB — CBC
Hematocrit: 33.3 % — ABNORMAL LOW (ref 34.0–46.6)
Hemoglobin: 11.1 g/dL (ref 11.1–15.9)
MCH: 28.6 pg (ref 26.6–33.0)
MCHC: 33.3 g/dL (ref 31.5–35.7)
MCV: 86 fL (ref 79–97)
Platelets: 295 10*3/uL (ref 150–450)
RBC: 3.88 x10E6/uL (ref 3.77–5.28)
RDW: 13 % (ref 11.7–15.4)
WBC: 9.1 10*3/uL (ref 3.4–10.8)

## 2019-10-30 LAB — RPR: RPR Ser Ql: NONREACTIVE

## 2019-10-30 LAB — GLUCOSE, 1 HOUR GESTATIONAL: Gestational Diabetes Screen: 147 mg/dL — ABNORMAL HIGH (ref 65–139)

## 2019-11-07 ENCOUNTER — Other Ambulatory Visit: Payer: Managed Care, Other (non HMO)

## 2019-11-07 ENCOUNTER — Other Ambulatory Visit: Payer: Self-pay

## 2019-11-07 DIAGNOSIS — Z3482 Encounter for supervision of other normal pregnancy, second trimester: Secondary | ICD-10-CM

## 2019-11-08 LAB — GESTATIONAL GLUCOSE TOLERANCE
Glucose, Fasting: 80 mg/dL (ref 65–94)
Glucose, GTT - 1 Hour: 210 mg/dL — ABNORMAL HIGH (ref 65–179)
Glucose, GTT - 2 Hour: 201 mg/dL — ABNORMAL HIGH (ref 65–154)
Glucose, GTT - 3 Hour: 116 mg/dL (ref 65–139)

## 2019-11-10 NOTE — Progress Notes (Signed)
Patient with abnormal 3 hr glucola. Please inform of need for referral to [facility] for GDM.

## 2019-11-11 ENCOUNTER — Other Ambulatory Visit: Payer: Self-pay

## 2019-11-11 DIAGNOSIS — Z3482 Encounter for supervision of other normal pregnancy, second trimester: Secondary | ICD-10-CM

## 2019-11-12 ENCOUNTER — Encounter: Payer: Managed Care, Other (non HMO) | Admitting: Obstetrics and Gynecology

## 2019-11-13 ENCOUNTER — Ambulatory Visit: Payer: Self-pay | Admitting: *Deleted

## 2019-11-14 ENCOUNTER — Ambulatory Visit: Payer: Self-pay | Admitting: *Deleted

## 2019-11-17 ENCOUNTER — Other Ambulatory Visit: Payer: Self-pay

## 2019-11-17 ENCOUNTER — Encounter: Payer: Managed Care, Other (non HMO) | Attending: Obstetrics and Gynecology | Admitting: *Deleted

## 2019-11-17 ENCOUNTER — Encounter: Payer: Self-pay | Admitting: *Deleted

## 2019-11-17 VITALS — BP 100/66 | Ht 64.0 in | Wt 179.4 lb

## 2019-11-17 DIAGNOSIS — O24410 Gestational diabetes mellitus in pregnancy, diet controlled: Secondary | ICD-10-CM

## 2019-11-17 DIAGNOSIS — O24419 Gestational diabetes mellitus in pregnancy, unspecified control: Secondary | ICD-10-CM | POA: Insufficient documentation

## 2019-11-17 DIAGNOSIS — Z3A00 Weeks of gestation of pregnancy not specified: Secondary | ICD-10-CM | POA: Diagnosis not present

## 2019-11-17 NOTE — Patient Instructions (Signed)
Read booklet on Gestational Diabetes Follow Gestational Meal Planning Guidelines Avoid cold cereal for breakfast Limit desserts/sweets (cookies) and limit foods high in fat (chips) Complete a 3 Day Food Record and bring to next appointment Check blood sugars 4 x day - before breakfast and 2 hrs after every meal and record  Bring blood sugar log to all appointments Call MD for prescription for meter strips and lancets Strips One Touch Verio  Lancets   One Touch Delica  Plus Purchase urine ketone strips if ordered by MD and check urine ketones every am:  If + increase bedtime snack to 1 protein and 2 carbohydrate servings Walk 20-30 minutes at least 5 x week if permitted by MD

## 2019-11-18 NOTE — Progress Notes (Signed)
Diabetes Self-Management Education  Visit Type: First/Initial  Appt. Start Time: 1535 Appt. End Time: 3016  11/17/2019  [name], identified by name and date of birth, is a 34 y.o. female with a diagnosis of Diabetes: Gestational Diabetes.   ASSESSMENT  Blood pressure 100/66, height 5\' 4"  (1.626 m), weight 179 lb 6.4 oz (81.4 kg), last menstrual period 04/30/2019, estimated date of delivery 01/26/2020 Body mass index is 30.79 kg/m.  Diabetes Self-Management Education - 11/17/19 1642      Visit Information   Visit Type  First/Initial      Initial Visit   Diabetes Type  Gestational Diabetes    Are you currently following a meal plan?  No    Are you taking your medications as prescribed?  Yes    Date Diagnosed  1 week ago      Health Coping   How would you rate your overall health?  Good      Psychosocial Assessment   Patient Belief/Attitude about Diabetes  Other (comment)   "sad"   Self-care barriers  None    Self-management support  Doctor's office;Family    Other persons present  Spouse/SO    Patient Concerns  Nutrition/Meal planning;Monitoring;Healthy Lifestyle    Special Needs  None    Preferred Learning Style  Visual;Hands on    Learning Readiness  Ready    How often do you need to have someone help you when you read instructions, pamphlets, or other written materials from your doctor or pharmacy?  1 - Never    What is the last grade level you completed in school?  Bachelors      Pre-Education Assessment   Patient understands the diabetes disease and treatment process.  Needs Instruction    Patient understands incorporating nutritional management into lifestyle.  Needs Instruction    Patient undertands incorporating physical activity into lifestyle.  Needs Instruction    Patient understands using medications safely.  Needs Instruction    Patient understands monitoring blood glucose, interpreting and using results  Needs Instruction    Patient  understands prevention, detection, and treatment of acute complications.  Needs Instruction    Patient understands prevention, detection, and treatment of chronic complications.  Needs Instruction    Patient understands how to develop strategies to address psychosocial issues.  Needs Instruction    Patient understands how to develop strategies to promote health/change behavior.  Needs Instruction      Complications   How often do you check your blood sugar?  0 times/day (not testing)   Provided One Touch Verio Flex meter and instructed on use. BG upon return demonstration was 75 mg/dL at 5:00 pm- 4 1/2 hrs pp.   Have you had a dilated eye exam in the past 12 months?  No    Have you had a dental exam in the past 12 months?  No    Are you checking your feet?  No      Dietary Intake   Breakfast  cereal and milk; waffles; eggs and bread    Snack (morning)  cookies    Lunch  left overs - chicken, beef, shrimp, pasta, rice    Snack (afternoon)  chips    Dinner  chicken, beef, shrimp, potatoes, peas, beans, corn, rice pasta, broccoli, carrots, tomatoes, cuccumbers, radishes    Snack (evening)  chips    Beverage(s)  water, coffee      Exercise   Exercise Type  ADL's      Patient  Education   Previous Diabetes Education  No    Disease state   Definition of diabetes, type 1 and 2, and the diagnosis of diabetes;Factors that contribute to the development of diabetes    Nutrition management   Role of diet in the treatment of diabetes and the relationship between the three main macronutrients and blood glucose level;Food label reading, portion sizes and measuring food.;Reviewed blood glucose goals for pre and post meals and how to evaluate the patients' food intake on their blood glucose level.    Physical activity and exercise   Role of exercise on diabetes management, blood pressure control and cardiac health.    Medications  Other (comment)   Limited use of oral medications during pregnancy and  possibility of insulin   Monitoring  Taught/evaluated SMBG meter.;Purpose and frequency of SMBG.;Taught/discussed recording of test results and interpretation of SMBG.;Ketone testing, when, how.    Chronic complications  Relationship between chronic complications and blood glucose control    Psychosocial adjustment  Role of stress on diabetes;Identified and addressed patients feelings and concerns about diabetes    Preconception care  Pregnancy and GDM  Role of pre-pregnancy blood glucose control on the development of the fetus;Reviewed with patient blood glucose goals with pregnancy;Role of family planning for patients with diabetes      Individualized Goals (developed by patient)   Reducing Risk Prevent diabetes complications Lead a healthier lifestyle     Outcomes   Expected Outcomes  Demonstrated interest in learning. Expect positive outcomes     Individualized Plan for Diabetes Self-Management Training:   Learning Objective:  Patient will have a greater understanding of diabetes self-management. Patient education plan is to attend individual and/or group sessions per assessed needs and concerns.   Plan:   Patient Instructions  Read booklet on Gestational Diabetes Follow Gestational Meal Planning Guidelines Avoid cold cereal for breakfast Limit desserts/sweets (cookies) and limit foods high in fat (chips) Complete a 3 Day Food Record and bring to next appointment Check blood sugars 4 x day - before breakfast and 2 hrs after every meal and record  Bring blood sugar log to all appointments Call MD for prescription for meter strips and lancets Strips One Touch Verio  Lancets   One Touch Delica  Plus Purchase urine ketone strips if ordered by MD and check urine ketones every am:  If + increase bedtime snack to 1 protein and 2 carbohydrate servings Walk 20-30 minutes at least 5 x week if permitted by MD  Expected Outcomes:  Demonstrated interest in learning. Expect positive  outcomes  Education material provided:  Gestational Booklet Gestational Meal Planning Guidelines Simple Meal Plan Viewed Gestational Diabetes Video Meter - One Touch Verio Flex 3 Day Food Record Goals for a Healthy Pregnancy  If problems or questions, patient to contact team via:  Sharion Settler, RN, CCM, CDE [phone]  Future DSME appointment: November 26, 2019 with the dietitian

## 2019-11-19 ENCOUNTER — Other Ambulatory Visit: Payer: Self-pay

## 2019-11-19 MED ORDER — GLUCOSE BLOOD VI STRP: ORAL_STRIP | 12 refills | Status: DC

## 2019-11-19 MED ORDER — ONETOUCH DELICA PLUS LANCET33G MISC: 1.0000 | Freq: Four times a day (QID) | 2 refills | Status: DC

## 2019-11-20 ENCOUNTER — Ambulatory Visit (INDEPENDENT_AMBULATORY_CARE_PROVIDER_SITE_OTHER): Payer: Managed Care, Other (non HMO) | Admitting: Obstetrics and Gynecology

## 2019-11-20 ENCOUNTER — Other Ambulatory Visit: Payer: Self-pay

## 2019-11-20 VITALS — BP 137/75 | HR 89 | Wt 179.0 lb

## 2019-11-20 DIAGNOSIS — Z23 Encounter for immunization: Secondary | ICD-10-CM

## 2019-11-20 DIAGNOSIS — O24410 Gestational diabetes mellitus in pregnancy, diet controlled: Secondary | ICD-10-CM

## 2019-11-20 DIAGNOSIS — Z3A30 30 weeks gestation of pregnancy: Secondary | ICD-10-CM

## 2019-11-20 DIAGNOSIS — Z3483 Encounter for supervision of other normal pregnancy, third trimester: Secondary | ICD-10-CM

## 2019-11-20 LAB — POCT URINALYSIS DIPSTICK OB
Bilirubin, UA: NEGATIVE
Blood, UA: NEGATIVE
Glucose, UA: NEGATIVE
Ketones, UA: NEGATIVE
Leukocytes, UA: NEGATIVE
Nitrite, UA: NEGATIVE
POC,PROTEIN,UA: NEGATIVE
Spec Grav, UA: 1.015 (ref 1.010–1.025)
Urobilinogen, UA: 0.2 E.U./dL
pH, UA: 6 (ref 5.0–8.0)

## 2019-11-20 MED ORDER — TETANUS-DIPHTH-ACELL PERTUSSIS 5-2.5-18.5 LF-MCG/0.5 IM SUSP
0.5000 mL | Freq: Once | INTRAMUSCULAR | Status: AC
  Administered 2019-11-20: 16:00:00 0.5 mL via INTRAMUSCULAR

## 2019-11-20 MED ORDER — GLUCOSE BLOOD VI STRP: ORAL_STRIP | 12 refills | Status: DC

## 2019-11-20 NOTE — Progress Notes (Signed)
Patient has not checked blood sugar since yesterday morning, pharmacy is out of strips.

## 2019-11-20 NOTE — Progress Notes (Signed)
ROB: Patient doing well, no complaints. S/p visit with lactation student today, Ready Set Baby discussed. Patient newly diagnosed GDM, needs refill of testing strips. Glucose log reviewed, only 2 days of values noted, fastings 90s, postprandials highs of 120s. Will continue to monitor. RTC in 2 weeks.

## 2019-11-20 NOTE — Progress Notes (Signed)
Lactation student discussed benefits of breastfeeding per the Ready, Set, Baby curriculum. Christy Farmer encouraged to review breastfeeding information on Ready, set, [employer] site and given information for virtual breastfeeding classes. Mom is interested in BF this baby, was not well supported in the hospital with first baby.

## 2019-11-26 ENCOUNTER — Encounter: Payer: Managed Care, Other (non HMO) | Admitting: Skilled Nursing Facility1

## 2019-11-26 ENCOUNTER — Other Ambulatory Visit: Payer: Self-pay

## 2019-11-26 DIAGNOSIS — O24410 Gestational diabetes mellitus in pregnancy, diet controlled: Secondary | ICD-10-CM | POA: Diagnosis present

## 2019-11-26 DIAGNOSIS — O24419 Gestational diabetes mellitus in pregnancy, unspecified control: Secondary | ICD-10-CM | POA: Diagnosis not present

## 2019-11-26 DIAGNOSIS — Z8632 Personal history of gestational diabetes: Secondary | ICD-10-CM

## 2019-11-26 HISTORY — DX: Personal history of gestational diabetes: Z86.32

## 2019-11-26 NOTE — Progress Notes (Signed)
Pt states she is about [redacted] weeks along. 2 hours after lunch: 126, 154, 127 Fasting: 115, 100, 96, 93, 77, 89 Pt states she checks her blood sugars 4 times a day but sometimes forgets to check.  Pt states she felt shaky from not eating for 3 hours and checked her blood sugar which was 70, ate cracklings.  Pt arrives with her seemingly supportive husband.  Pt had specific questions about specific foods: these questions were answered to pts satisfaction.  Pt states her husband cooks for her.   Educated on: Hypoglycemia Appropriate food choices for blood sugar control Proper hygiene for blood sugar testing What is a fast acting sugar to bring low blood sugars up The importance of physical activity and its impact on blood sugar control  Goals: If feeling low blood sugar symptoms eat something with sugar in it such as fruit or candy  Cows milk is an okay option for snacks  It is okay to have an appropriate serving of pizza: 2 slices thin crust with salad Create balanced meals daily

## 2019-12-03 ENCOUNTER — Other Ambulatory Visit: Payer: Self-pay

## 2019-12-03 ENCOUNTER — Encounter: Payer: Managed Care, Other (non HMO) | Admitting: Obstetrics and Gynecology

## 2019-12-03 ENCOUNTER — Ambulatory Visit: Payer: Managed Care, Other (non HMO) | Admitting: Obstetrics and Gynecology

## 2019-12-03 VITALS — BP 109/70 | HR 85 | Wt 182.1 lb

## 2019-12-03 DIAGNOSIS — Z3483 Encounter for supervision of other normal pregnancy, third trimester: Secondary | ICD-10-CM

## 2019-12-03 LAB — POCT URINALYSIS DIPSTICK OB
Bilirubin, UA: NEGATIVE
Blood, UA: NEGATIVE
Glucose, UA: NEGATIVE
Ketones, UA: NEGATIVE
Leukocytes, UA: NEGATIVE
Nitrite, UA: NEGATIVE
POC,PROTEIN,UA: NEGATIVE
Spec Grav, UA: 1.01 (ref 1.010–1.025)
Urobilinogen, UA: 0.2 E.U./dL
pH, UA: 7 (ref 5.0–8.0)

## 2019-12-03 NOTE — Progress Notes (Signed)
Patient comes in today for ROB visit. Patient states that her hemorrhoids  have been worse.

## 2019-12-03 NOTE — Progress Notes (Signed)
ROB: Patient states that she is constipated and having some difficulty with hemorrhoids.  She is using suppositories and these are helping.  We have discussed constipation and the use of Citrucel.  She also had a number of questions regarding disposition of the placenta and cord blood banking.  We have discussed both of these in detail.  All of her questions were answered.  She did not bring her sugar log today and I have discussed the necessity of bringing this to every visit.  She reports her fastings as below 90 and most of her postprandials below 130.

## 2019-12-16 ENCOUNTER — Ambulatory Visit (INDEPENDENT_AMBULATORY_CARE_PROVIDER_SITE_OTHER): Payer: Managed Care, Other (non HMO) | Admitting: Obstetrics and Gynecology

## 2019-12-16 ENCOUNTER — Encounter: Payer: Self-pay | Admitting: Obstetrics and Gynecology

## 2019-12-16 ENCOUNTER — Other Ambulatory Visit: Payer: Self-pay

## 2019-12-16 VITALS — BP 128/76 | HR 78 | Wt 179.9 lb

## 2019-12-16 DIAGNOSIS — O0993 Supervision of high risk pregnancy, unspecified, third trimester: Secondary | ICD-10-CM

## 2019-12-16 DIAGNOSIS — O24410 Gestational diabetes mellitus in pregnancy, diet controlled: Secondary | ICD-10-CM

## 2019-12-16 LAB — POCT URINALYSIS DIPSTICK OB
Bilirubin, UA: NEGATIVE
Blood, UA: NEGATIVE
Glucose, UA: NEGATIVE
Leukocytes, UA: NEGATIVE
Nitrite, UA: NEGATIVE
POC,PROTEIN,UA: NEGATIVE
Spec Grav, UA: 1.01 (ref 1.010–1.025)
Urobilinogen, UA: 0.2 E.U./dL
pH, UA: 6 (ref 5.0–8.0)

## 2019-12-16 NOTE — Progress Notes (Signed)
[name] present for routine prenatal care. Pt c/o lower back pain and lower abd pressure. No other complaints at this time.

## 2019-12-16 NOTE — Progress Notes (Signed)
ROB: Patient doing well. Does note some increased pelvic pressure, encouraged use of pregnancy band. Did not check sugars over the weekend as she had her baby shower and "ate poorly".  Also sometimes forgets to check her sugars during the week. Fastings that were collected range from 75-100.  Postprandials breakfast range 86-163, lunch 85-151, dinner 101-161, however values scattered. Will return in 1 week with full glucose log completed. If still elevated, discussed need for medication. Will schedule for growth ultrasound at 36 weeks.

## 2019-12-16 NOTE — Patient Instructions (Signed)
Gestational Diabetes Mellitus, Self Care  Caring for yourself after you have been diagnosed with gestational diabetes (gestational diabetes mellitus) means keeping your blood sugar (glucose) under control. You can do that with a balance of:   Nutrition.   Exercise.   Lifestyle changes.   Medicines or insulin, if necessary.   Support from your team of health care providers and others.  The following information explains what you need to know to manage your gestational diabetes at home.  What are the risks?  If gestational diabetes is treated, it is unlikely to cause problems. If it is not controlled with treatment, it may cause problems during labor and delivery, and some of those problems can be harmful to the unborn baby (fetus) and the mother. Uncontrolled gestational diabetes may also cause the newborn baby to have breathing problems and low blood glucose.  Women who get gestational diabetes are more likely to develop it if they get pregnant again, and they are more likely to develop type 2 diabetes in the future.  How to monitor blood glucose     Check your blood glucose every day during your pregnancy. Do this as often as told by your health care provider.   Contact your health care provider if your blood glucose is above your target for two tests in a row.  Your health care provider will set individualized treatment goals for you. Generally, the goal of treatment is to maintain the following blood glucose levels during pregnancy:   Before meals (preprandial): at or below 95 mg/dL (5.3 mmol/L).   After meals (postprandial):  ? One hour after a meal: at or below 140 mg/dL (7.8 mmol/L).  ? Two hours after a meal: at or below 120 mg/dL (6.7 mmol/L).   A1c (hemoglobin A1c) level: 6-6.5%.  How to manage hyperglycemia and hypoglycemia  Hyperglycemia symptoms  Hyperglycemia, also called high blood glucose, occurs when blood glucose is too high. Make sure you know the early signs of hyperglycemia, such  as:   Increased thirst.   Hunger.   Feeling very tired.   Needing to urinate more often than usual.   Blurry vision.  Hypoglycemia symptoms  Hypoglycemia, also called low blood glucose, occurs with a blood glucose level at or below 70 mg/dL (3.9 mmol/L). The risk for hypoglycemia increases during or after exercise, during sleep, during illness, and when skipping meals or not eating for a long time (fasting). Symptoms may include:   Hunger.   Anxiety.   Sweating and feeling clammy.   Confusion.   Dizziness or feeling light-headed.   Sleepiness.   Nausea.   Increased heart rate.   Headache.   Blurry vision.   Irritability.   Tingling or numbness around the mouth, lips, or tongue.   A change in coordination.   Restless sleep.   Fainting.   Seizure.  It is important to know the symptoms of hypoglycemia and treat it right away. Always have a 15-gram rapid-acting carbohydrate snack with you to treat low blood glucose. Family members and close friends should also know the symptoms and should understand how to treat hypoglycemia, in case you are not able to treat yourself.  Treating hypoglycemia  If you are alert and able to swallow safely, follow the 15:15 rule:   Take 15 grams of a rapid-acting carbohydrate. Talk with your health care provider about how much you should take.   Rapid-acting options include:  ? Glucose pills (take 15 grams).  ? 6-8 pieces of hard candy.  ?   mmol/L), take 15 grams of a carbohydrate again.  If your blood glucose level does not increase above 70 mg/dL (3.9 mmol/L) after 3 tries, seek emergency medical care.  After your blood glucose level returns to normal, eat a meal or a snack within 1 hour. Treating  severe hypoglycemia Severe hypoglycemia is when your blood glucose level is at or below 54 mg/dL (3 mmol/L). Severe hypoglycemia is an emergency. Do not wait to see if the symptoms will go away. Get medical help right away. Call your local emergency services (911 in the [place] If you have severe hypoglycemia and you cannot eat or drink, you may need an injection of glucagon. A family member or close friend should learn how to check your blood glucose and how to give you a glucagon injection. Ask your health care provider if you need to have an emergency glucagon injection kit available. Severe hypoglycemia may need to be treated in a hospital. The treatment may include getting glucose through an IV. You may also need treatment for the cause of your hypoglycemia. Follow these instructions at home: Take diabetes medicines as told  If your health care provider prescribed insulin or diabetes medicines, take them every day.  Do not run out of insulin or other diabetes medicines that you take. Plan ahead so you always have these available.  If you use insulin, adjust your dosage based on how physically active you are and what foods you eat. Your health care provider will tell you how to adjust your dosage. Make healthy food choices  The things that you eat and drink affect your blood glucose (and your insulin dose, if this applies). Making good choices helps to control your diabetes and prevent other health problems. A healthy meal plan includes eating lean proteins, complex carbohydrates, fresh fruits and vegetables, low-fat dairy products, and healthy fats. Make an appointment to see a diet and nutrition specialist (registered dietitian) to help you create an eating plan that is right for you. Make sure that you:  Follow instructions from your health care provider about eating or drinking restrictions.  Drink enough fluid to keep your urine pale yellow.  Eat healthy snacks between nutritious  meals.  Keep a record of the carbohydrates that you eat. Do this by reading food labels and learning the standard serving sizes of foods.  Follow your sick day plan whenever you cannot eat or drink as usual. Make this plan in advance with your health care provider.  Stay active  Do 30 or more minutes of physical activity a day, or as much physical activity as your health care provider recommends during your pregnancy. ? Doing 10 minutes of exercise starting 30 minutes after each meal may help to control postprandial blood glucose levels.  If you start a new exercise or activity, work with your health care provider to adjust your insulin, medicines, or food intake as needed. Make healthy lifestyle choices  Do not drink alcohol.  Do not use any tobacco products, such as cigarettes, chewing tobacco, and e-cigarettes. If you need help quitting, ask your health care provider.  Learn to manage stress. If you need help with this, ask your health care provider. Care for your body  Keep your immunizations up to date.  Brush your teeth and gums two times a day, and floss one or more times a day. Visit your dentist one or more times every 6 months.  Maintain a healthy weight during your pregnancy. General instructions  Take over-the-counter and  prescription medicines only as told by your health care provider.  Talk with your health care provider about your risk for high blood pressure during pregnancy (preeclampsia or eclampsia).  Share your diabetes management plan with people in your workplace, school, and household.  Check your urine for ketones during your pregnancy when you are ill and as told by your health care provider.  Carry a medical alert card or wear medical alert jewelry that says you have gestational diabetes.  Keep all follow-up visits during your pregnancy (prenatal) and after delivery (postnatal) as told by your health care provider. This is important. Get the care that  you need after delivery  Have your blood glucose level checked 4-12 weeks after delivery. This is done with an oral glucose tolerance test (OGTT).  Get screened for diabetes at least every 3 years, or as often as told by your health care provider. Questions to ask your health care provider  Do I need to meet with a diabetes educator?  Where can I find a support group for people with gestational diabetes? Where to find more information For more information about gestational diabetes, visit:  American Diabetes Association (ADA): www.diabetes.org  Centers for Disease Control and Prevention (CDC): www.cdc.gov Summary  Check your blood glucose every day during your pregnancy. Do this as often as told by your health care provider.  If your health care provider prescribed insulin or diabetes medicines, take them every day as told.  Keep all follow-up visits during your pregnancy (prenatal) and after delivery (postnatal) as told by your health care provider. This is important.  Have your blood glucose level checked 4-12 weeks after delivery. This information is not intended to replace advice given to you by your health care provider. Make sure you discuss any questions you have with your health care provider. Document Revised: 03/25/2018 Document Reviewed: 11/05/2015 Elsevier Patient Education  2020 [employer]  

## 2019-12-23 ENCOUNTER — Ambulatory Visit (INDEPENDENT_AMBULATORY_CARE_PROVIDER_SITE_OTHER): Payer: Managed Care, Other (non HMO) | Admitting: Obstetrics and Gynecology

## 2019-12-23 ENCOUNTER — Other Ambulatory Visit: Payer: Self-pay

## 2019-12-23 VITALS — BP 110/75 | HR 91 | Wt 179.0 lb

## 2019-12-23 DIAGNOSIS — O24410 Gestational diabetes mellitus in pregnancy, diet controlled: Secondary | ICD-10-CM

## 2019-12-23 DIAGNOSIS — O0993 Supervision of high risk pregnancy, unspecified, third trimester: Secondary | ICD-10-CM

## 2019-12-23 LAB — POCT URINALYSIS DIPSTICK OB
Bilirubin, UA: NEGATIVE
Blood, UA: NEGATIVE
Glucose, UA: NEGATIVE
Ketones, UA: NEGATIVE
Leukocytes, UA: NEGATIVE
Nitrite, UA: NEGATIVE
Spec Grav, UA: 1.01 (ref 1.010–1.025)
Urobilinogen, UA: 0.2 E.U./dL
pH, UA: 7 (ref 5.0–8.0)

## 2019-12-23 NOTE — Progress Notes (Signed)
ROB:  Abd pain - belly band not working.  Sugars good.  Cx's next visit.

## 2019-12-23 NOTE — Addendum Note (Signed)
Addended by: Dorian Pod on: 12/23/2019 03:50 PM   Modules accepted: Orders

## 2019-12-30 ENCOUNTER — Encounter: Payer: Managed Care, Other (non HMO) | Admitting: Obstetrics and Gynecology

## 2019-12-30 ENCOUNTER — Other Ambulatory Visit: Payer: Managed Care, Other (non HMO)

## 2019-12-30 NOTE — Patient Instructions (Signed)
Third Trimester of Pregnancy  The third trimester is from week 28 through week 40 (months 7 through 9). This trimester is when your unborn baby (fetus) is growing very fast. At the end of the ninth month, the unborn baby is about 20 inches in length. It weighs about 6-10 pounds. Follow these instructions at home: Medicines  Take over-the-counter and prescription medicines only as told by your doctor. Some medicines are safe and some medicines are not safe during pregnancy.  Take a prenatal vitamin that contains at least 600 micrograms (mcg) of folic acid.  If you have trouble pooping (constipation), take medicine that will make your stool soft (stool softener) if your doctor approves. Eating and drinking   Eat regular, healthy meals.  Avoid raw meat and uncooked cheese.  If you get low calcium from the food you eat, talk to your doctor about taking a daily calcium supplement.  Eat four or five small meals rather than three large meals a day.  Avoid foods that are high in fat and sugars, such as fried and sweet foods.  To prevent constipation: ? Eat foods that are high in fiber, like fresh fruits and vegetables, whole grains, and beans. ? Drink enough fluids to keep your pee (urine) clear or pale yellow. Activity  Exercise only as told by your doctor. Stop exercising if you start to have cramps.  Avoid heavy lifting, wear low heels, and sit up straight.  Do not exercise if it is too hot, too humid, or if you are in a place of great height (high altitude).  You may continue to have sex unless your doctor tells you not to. Relieving pain and discomfort  Wear a good support bra if your breasts are tender.  Take frequent breaks and rest with your legs raised if you have leg cramps or low back pain.  Take warm water baths (sitz baths) to soothe pain or discomfort caused by hemorrhoids. Use hemorrhoid cream if your doctor approves.  If you develop puffy, bulging veins (varicose  veins) in your legs: ? Wear support hose or compression stockings as told by your doctor. ? Raise (elevate) your feet for 15 minutes, 3-4 times a day. ? Limit salt in your food. Safety  Wear your seat belt when driving.  Make a list of emergency phone numbers, including numbers for family, friends, the hospital, and police and fire departments. Preparing for your baby's arrival To prepare for the arrival of your baby:  Take prenatal classes.  Practice driving to the hospital.  Visit the hospital and tour the maternity area.  Talk to your work about taking leave once the baby comes.  Pack your hospital bag.  Prepare the baby's room.  Go to your doctor visits.  Buy a rear-facing car seat. Learn how to install it in your car. General instructions  Do not use hot tubs, steam rooms, or saunas.  Do not use any products that contain nicotine or tobacco, such as cigarettes and e-cigarettes. If you need help quitting, ask your doctor.  Do not drink alcohol.  Do not douche or use tampons or scented sanitary pads.  Do not cross your legs for long periods of time.  Do not travel for long distances unless you must. Only do so if your doctor says it is okay.  Visit your dentist if you have not gone during your pregnancy. Use a soft toothbrush to brush your teeth. Be gentle when you floss.  Avoid cat litter boxes and soil   used by cats. These carry germs that can cause birth defects in the baby and can cause a loss of your baby (miscarriage) or stillbirth.  Keep all your prenatal visits as told by your doctor. This is important. Contact a doctor if:  You are not sure if you are in labor or if your water has broken.  You are dizzy.  You have mild cramps or pressure in your lower belly.  You have a nagging pain in your belly area.  You continue to feel sick to your stomach, you throw up, or you have watery poop.  You have bad smelling fluid coming from your vagina.  You have  pain when you pee. Get help right away if:  You have a fever.  You are leaking fluid from your vagina.  You are spotting or bleeding from your vagina.  You have severe belly cramps or pain.  You lose or gain weight quickly.  You have trouble catching your breath and have chest pain.  You notice sudden or extreme puffiness (swelling) of your face, hands, ankles, feet, or legs.  You have not felt the baby move in over an hour.  You have severe headaches that do not go away with medicine.  You have trouble seeing.  You are leaking, or you are having a gush of fluid, from your vagina before you are 37 weeks.  You have regular belly spasms (contractions) before you are 37 weeks. Summary  The third trimester is from week 28 through week 40 (months 7 through 9). This time is when your unborn baby is growing very fast.  Follow your doctor's advice about medicine, food, and activity.  Get ready for the arrival of your baby by taking prenatal classes, getting all the baby items ready, preparing the baby's room, and visiting your doctor to be checked.  Get help right away if you are bleeding from your vagina, or you have chest pain and trouble catching your breath, or if you have not felt your baby move in over an hour. This information is not intended to replace advice given to you by your health care provider. Make sure you discuss any questions you have with your health care provider. Document Revised: 01/23/2019 Document Reviewed: 11/07/2016 Elsevier Patient Education  2020 [employer]    Breastfeeding  Choosing to breastfeed is one of the best decisions you can make for yourself and your baby. A change in hormones during pregnancy causes your breasts to make breast milk in your milk-producing glands. Hormones prevent breast milk from being released before your baby is born. They also prompt milk flow after birth. Once breastfeeding has begun, thoughts of your baby, as well as  his or her sucking or crying, can stimulate the release of milk from your milk-producing glands. Benefits of breastfeeding Research shows that breastfeeding offers many health benefits for infants and mothers. It also offers a cost-free and convenient way to feed your baby. For your baby  Your first milk (colostrum) helps your baby's digestive system to function better.  Special cells in your milk (antibodies) help your baby to fight off infections.  Breastfed babies are less likely to develop asthma, allergies, obesity, or type 2 diabetes. They are also at lower risk for sudden infant death syndrome (SIDS).  Nutrients in breast milk are better able to meet your baby's needs compared to infant formula.  Breast milk improves your baby's brain development. For you  Breastfeeding helps to create a very special bond between   you and your baby.  Breastfeeding is convenient. Breast milk costs nothing and is always available at the correct temperature.  Breastfeeding helps to burn calories. It helps you to lose the weight that you gained during pregnancy.  Breastfeeding makes your uterus return faster to its size before pregnancy. It also slows bleeding (lochia) after you give birth.  Breastfeeding helps to lower your risk of developing type 2 diabetes, osteoporosis, rheumatoid arthritis, cardiovascular disease, and breast, ovarian, uterine, and endometrial cancer later in life. Breastfeeding basics Starting breastfeeding  Find a comfortable place to sit or lie down, with your neck and back well-supported.  Place a pillow or a rolled-up blanket under your baby to bring him or her to the level of your breast (if you are seated). Nursing pillows are specially designed to help support your arms and your baby while you breastfeed.  Make sure that your baby's tummy (abdomen) is facing your abdomen.  Gently massage your breast. With your fingertips, massage from the outer edges of your breast  inward toward the nipple. This encourages milk flow. If your milk flows slowly, you may need to continue this action during the feeding.  Support your breast with 4 fingers underneath and your thumb above your nipple (make the letter "C" with your hand). Make sure your fingers are well away from your nipple and your baby's mouth.  Stroke your baby's lips gently with your finger or nipple.  When your baby's mouth is open wide enough, quickly bring your baby to your breast, placing your entire nipple and as much of the areola as possible into your baby's mouth. The areola is the colored area around your nipple. ? More areola should be visible above your baby's upper lip than below the lower lip. ? Your baby's lips should be opened and extended outward (flanged) to ensure an adequate, comfortable latch. ? Your baby's tongue should be between his or her lower gum and your breast.  Make sure that your baby's mouth is correctly positioned around your nipple (latched). Your baby's lips should create a seal on your breast and be turned out (everted).  It is common for your baby to suck about 2-3 minutes in order to start the flow of breast milk. Latching Teaching your baby how to latch onto your breast properly is very important. An improper latch can cause nipple pain, decreased milk supply, and poor weight gain in your baby. Also, if your baby is not latched onto your nipple properly, he or she may swallow some air during feeding. This can make your baby fussy. Burping your baby when you switch breasts during the feeding can help to get rid of the air. However, teaching your baby to latch on properly is still the best way to prevent fussiness from swallowing air while breastfeeding. Signs that your baby has successfully latched onto your nipple  Silent tugging or silent sucking, without causing you pain. Infant's lips should be extended outward (flanged).  Swallowing heard between every 3-4 sucks once  your milk has started to flow (after your let-down milk reflex occurs).  Muscle movement above and in front of his or her ears while sucking. Signs that your baby has not successfully latched onto your nipple  Sucking sounds or smacking sounds from your baby while breastfeeding.  Nipple pain. If you think your baby has not latched on correctly, slip your finger into the corner of your baby's mouth to break the suction and place it between your baby's gums. Attempt   to start breastfeeding again. Signs of successful breastfeeding Signs from your baby  Your baby will gradually decrease the number of sucks or will completely stop sucking.  Your baby will fall asleep.  Your baby's body will relax.  Your baby will retain a small amount of milk in his or her mouth.  Your baby will let go of your breast by himself or herself. Signs from you  Breasts that have increased in firmness, weight, and size 1-3 hours after feeding.  Breasts that are softer immediately after breastfeeding.  Increased milk volume, as well as a change in milk consistency and color by the fifth day of breastfeeding.  Nipples that are not sore, cracked, or bleeding. Signs that your baby is getting enough milk  Wetting at least 1-2 diapers during the first 24 hours after birth.  Wetting at least 5-6 diapers every 24 hours for the first week after birth. The urine should be clear or pale yellow by the age of 5 days.  Wetting 6-8 diapers every 24 hours as your baby continues to grow and develop.  At least 3 stools in a 24-hour period by the age of 5 days. The stool should be soft and yellow.  At least 3 stools in a 24-hour period by the age of 7 days. The stool should be seedy and yellow.  No loss of weight greater than 10% of birth weight during the first 3 days of life.  Average weight gain of 4-7 oz (113-198 g) per week after the age of 4 days.  Consistent daily weight gain by the age of 5 days, without weight  loss after the age of 2 weeks. After a feeding, your baby may spit up a small amount of milk. This is normal. Breastfeeding frequency and duration Frequent feeding will help you make more milk and can prevent sore nipples and extremely full breasts (breast engorgement). Breastfeed when you feel the need to reduce the fullness of your breasts or when your baby shows signs of hunger. This is called "breastfeeding on demand." Signs that your baby is hungry include:  Increased alertness, activity, or restlessness.  Movement of the head from side to side.  Opening of the mouth when the corner of the mouth or cheek is stroked (rooting).  Increased sucking sounds, smacking lips, cooing, sighing, or squeaking.  Hand-to-mouth movements and sucking on fingers or hands.  Fussing or crying. Avoid introducing a pacifier to your baby in the first 4-6 weeks after your baby is born. After this time, you may choose to use a pacifier. Research has shown that pacifier use during the first year of a baby's life decreases the risk of sudden infant death syndrome (SIDS). Allow your baby to feed on each breast as long as he or she wants. When your baby unlatches or falls asleep while feeding from the first breast, offer the second breast. Because newborns are often sleepy in the first few weeks of life, you may need to awaken your baby to get him or her to feed. Breastfeeding times will vary from baby to baby. However, the following rules can serve as a guide to help you make sure that your baby is properly fed:  Newborns (babies 4 weeks of age or younger) may breastfeed every 1-3 hours.  Newborns should not go without breastfeeding for longer than 3 hours during the day or 5 hours during the night.  You should breastfeed your baby a minimum of 8 times in a 24-hour period. Breast   milk pumping     Pumping and storing breast milk allows you to make sure that your baby is exclusively fed your breast milk, even at  times when you are unable to breastfeed. This is especially important if you go back to work while you are still breastfeeding, or if you are not able to be present during feedings. Your lactation consultant can help you find a method of pumping that works best for you and give you guidelines about how long it is safe to store breast milk. Caring for your breasts while you breastfeed Nipples can become dry, cracked, and sore while breastfeeding. The following recommendations can help keep your breasts moisturized and healthy:  Avoid using soap on your nipples.  Wear a supportive bra designed especially for nursing. Avoid wearing underwire-style bras or extremely tight bras (sports bras).  Air-dry your nipples for 3-4 minutes after each feeding.  Use only cotton bra pads to absorb leaked breast milk. Leaking of breast milk between feedings is normal.  Use lanolin on your nipples after breastfeeding. Lanolin helps to maintain your skin's normal moisture barrier. Pure lanolin is not harmful (not toxic) to your baby. You may also hand express a few drops of breast milk and gently massage that milk into your nipples and allow the milk to air-dry. In the first few weeks after giving birth, some women experience breast engorgement. Engorgement can make your breasts feel heavy, warm, and tender to the touch. Engorgement peaks within 3-5 days after you give birth. The following recommendations can help to ease engorgement:  Completely empty your breasts while breastfeeding or pumping. You may want to start by applying warm, moist heat (in the shower or with warm, water-soaked hand towels) just before feeding or pumping. This increases circulation and helps the milk flow. If your baby does not completely empty your breasts while breastfeeding, pump any extra milk after he or she is finished.  Apply ice packs to your breasts immediately after breastfeeding or pumping, unless this is too uncomfortable for you.  To do this: ? Put ice in a plastic bag. ? Place a towel between your skin and the bag. ? Leave the ice on for 20 minutes, 2-3 times a day.  Make sure that your baby is latched on and positioned properly while breastfeeding. If engorgement persists after 48 hours of following these recommendations, contact your health care provider or a lactation consultant. Overall health care recommendations while breastfeeding  Eat 3 healthy meals and 3 snacks every day. Well-nourished mothers who are breastfeeding need an additional 450-500 calories a day. You can meet this requirement by increasing the amount of a balanced diet that you eat.  Drink enough water to keep your urine pale yellow or clear.  Rest often, relax, and continue to take your prenatal vitamins to prevent fatigue, stress, and low vitamin and mineral levels in your body (nutrient deficiencies).  Do not use any products that contain nicotine or tobacco, such as cigarettes and e-cigarettes. Your baby may be harmed by chemicals from cigarettes that pass into breast milk and exposure to secondhand smoke. If you need help quitting, ask your health care provider.  Avoid alcohol.  Do not use illegal drugs or marijuana.  Talk with your health care provider before taking any medicines. These include over-the-counter and prescription medicines as well as vitamins and herbal supplements. Some medicines that may be harmful to your baby can pass through breast milk.  It is possible to become pregnant while breastfeeding.   If birth control is desired, ask your health care provider about options that will be safe while breastfeeding your baby. Where to find more information: [employer]: www.llli.org Contact a health care provider if:  You feel like you want to stop breastfeeding or have become frustrated with breastfeeding.  Your nipples are cracked or bleeding.  Your breasts are red, tender, or warm.  You have: ? Painful  breasts or nipples. ? A swollen area on either breast. ? A fever or chills. ? Nausea or vomiting. ? Drainage other than breast milk from your nipples.  Your breasts do not become full before feedings by the fifth day after you give birth.  You feel sad and depressed.  Your baby is: ? Too sleepy to eat well. ? Having trouble sleeping. ? More than 1 week old and wetting fewer than 6 diapers in a 24-hour period. ? Not gaining weight by 5 days of age.  Your baby has fewer than 3 stools in a 24-hour period.  Your baby's skin or the white parts of his or her eyes become yellow. Get help right away if:  Your baby is overly tired (lethargic) and does not want to wake up and feed.  Your baby develops an unexplained fever. Summary  Breastfeeding offers many health benefits for infant and mothers.  Try to breastfeed your infant when he or she shows early signs of hunger.  Gently tickle or stroke your baby's lips with your finger or nipple to allow the baby to open his or her mouth. Bring the baby to your breast. Make sure that much of the areola is in your baby's mouth. Offer one side and burp the baby before you offer the other side.  Talk with your health care provider or lactation consultant if you have questions or you face problems as you breastfeed. This information is not intended to replace advice given to you by your health care provider. Make sure you discuss any questions you have with your health care provider. Document Revised: 12/27/2017 Document Reviewed: 11/03/2016 Elsevier Patient Education  2020 [employer]   

## 2019-12-31 ENCOUNTER — Ambulatory Visit (INDEPENDENT_AMBULATORY_CARE_PROVIDER_SITE_OTHER): Payer: Managed Care, Other (non HMO)

## 2019-12-31 ENCOUNTER — Ambulatory Visit (INDEPENDENT_AMBULATORY_CARE_PROVIDER_SITE_OTHER): Payer: Managed Care, Other (non HMO) | Admitting: Obstetrics and Gynecology

## 2019-12-31 ENCOUNTER — Encounter: Payer: Self-pay | Admitting: Obstetrics and Gynecology

## 2019-12-31 ENCOUNTER — Other Ambulatory Visit: Payer: Self-pay

## 2019-12-31 VITALS — BP 109/71 | HR 83 | Wt 181.5 lb

## 2019-12-31 DIAGNOSIS — Z362 Encounter for other antenatal screening follow-up: Secondary | ICD-10-CM

## 2019-12-31 DIAGNOSIS — Z3A36 36 weeks gestation of pregnancy: Secondary | ICD-10-CM | POA: Diagnosis not present

## 2019-12-31 DIAGNOSIS — O24410 Gestational diabetes mellitus in pregnancy, diet controlled: Secondary | ICD-10-CM

## 2019-12-31 DIAGNOSIS — O0993 Supervision of high risk pregnancy, unspecified, third trimester: Secondary | ICD-10-CM

## 2019-12-31 LAB — POCT URINALYSIS DIPSTICK OB
Bilirubin, UA: NEGATIVE
Blood, UA: NEGATIVE
Glucose, UA: NEGATIVE
Leukocytes, UA: NEGATIVE
Nitrite, UA: NEGATIVE
Spec Grav, UA: 1.02 (ref 1.010–1.025)
Urobilinogen, UA: 0.2 E.U./dL
pH, UA: 7 (ref 5.0–8.0)

## 2019-12-31 NOTE — Progress Notes (Signed)
[name] present for routine prenatal care. Pt c/o lower back pain, increase in discharge, tired, vaginal pain, headaches and pelvic pain.

## 2019-12-31 NOTE — Progress Notes (Signed)
ROB: Patient notes she has been moving this week, not sure where her blood sugar log is. Will give a new one. Growth scan done, 49%ile.  Notes more [facility], pain in vagina, and back pain. RTC in 1 week.

## 2020-01-02 LAB — STREP GP B NAA: Strep Gp B NAA: NEGATIVE

## 2020-01-03 LAB — GC/CHLAMYDIA PROBE AMP
Chlamydia trachomatis, NAA: NEGATIVE
Neisseria Gonorrhoeae by PCR: NEGATIVE

## 2020-01-06 ENCOUNTER — Other Ambulatory Visit: Payer: Self-pay

## 2020-01-06 ENCOUNTER — Ambulatory Visit (INDEPENDENT_AMBULATORY_CARE_PROVIDER_SITE_OTHER): Payer: Managed Care, Other (non HMO) | Admitting: Obstetrics and Gynecology

## 2020-01-06 ENCOUNTER — Encounter: Payer: Self-pay | Admitting: Obstetrics and Gynecology

## 2020-01-06 VITALS — BP 126/78 | HR 86 | Wt 182.6 lb

## 2020-01-06 DIAGNOSIS — O0993 Supervision of high risk pregnancy, unspecified, third trimester: Secondary | ICD-10-CM

## 2020-01-06 LAB — POCT URINALYSIS DIPSTICK OB
Bilirubin, UA: NEGATIVE
Blood, UA: NEGATIVE
Glucose, UA: NEGATIVE
Ketones, UA: NEGATIVE
Leukocytes, UA: NEGATIVE
Nitrite, UA: NEGATIVE
Spec Grav, UA: 1.01 (ref 1.010–1.025)
Urobilinogen, UA: 0.2 E.U./dL
pH, UA: 6.5 (ref 5.0–8.0)

## 2020-01-06 NOTE — Progress Notes (Signed)
ROB: Denies contractions.  Sugar log for 1 week looks good.  Active daily movement.  Cultures reviewed-negative

## 2020-01-12 NOTE — Progress Notes (Signed)
[name] present for routine prenatal care. Pt stated having a lot of lower abd pain; swollen feet, and headaches.

## 2020-01-13 ENCOUNTER — Other Ambulatory Visit: Payer: Self-pay

## 2020-01-13 ENCOUNTER — Ambulatory Visit (INDEPENDENT_AMBULATORY_CARE_PROVIDER_SITE_OTHER): Payer: Managed Care, Other (non HMO) | Admitting: Obstetrics and Gynecology

## 2020-01-13 ENCOUNTER — Encounter: Payer: Self-pay | Admitting: Obstetrics and Gynecology

## 2020-01-13 VITALS — BP 106/66 | HR 79 | Wt 183.8 lb

## 2020-01-13 DIAGNOSIS — Z3A38 38 weeks gestation of pregnancy: Secondary | ICD-10-CM

## 2020-01-13 DIAGNOSIS — O0993 Supervision of high risk pregnancy, unspecified, third trimester: Secondary | ICD-10-CM

## 2020-01-13 LAB — POCT URINALYSIS DIPSTICK OB
Bilirubin, UA: NEGATIVE
Blood, UA: NEGATIVE
Glucose, UA: NEGATIVE
Nitrite, UA: NEGATIVE
POC,PROTEIN,UA: NEGATIVE
Spec Grav, UA: 1.025 (ref 1.010–1.025)
Urobilinogen, UA: 0.2 E.U./dL
pH, UA: 6.5 (ref 5.0–8.0)

## 2020-01-13 NOTE — Patient Instructions (Signed)

## 2020-01-13 NOTE — Progress Notes (Signed)
ROB: Patient complains of pelvic pressure, increased urination at night, headaches. Also notes Deberah Pelton. Overall blood sugars ok except one noted postprandial lunch of 213 (patient notes eating sweets after moving into a new house when a neighbor brought snacks).  Discussed labor precautions. RTC in 1 week.

## 2020-01-17 ENCOUNTER — Other Ambulatory Visit: Payer: Self-pay

## 2020-01-17 ENCOUNTER — Inpatient Hospital Stay
Admission: EM | Admit: 2020-01-17 | Discharge: 2020-01-20 | DRG: 807 | Disposition: A | Discharge status: Home / Self Care | Payer: Managed Care, Other (non HMO) | Attending: Obstetrics and Gynecology | Admitting: Obstetrics and Gynecology

## 2020-01-17 ENCOUNTER — Encounter: Payer: Self-pay | Admitting: Obstetrics and Gynecology

## 2020-01-17 DIAGNOSIS — Z3A38 38 weeks gestation of pregnancy: Secondary | ICD-10-CM | POA: Diagnosis not present

## 2020-01-17 DIAGNOSIS — O9952 Diseases of the respiratory system complicating childbirth: Secondary | ICD-10-CM | POA: Diagnosis present

## 2020-01-17 DIAGNOSIS — O9902 Anemia complicating childbirth: Secondary | ICD-10-CM | POA: Diagnosis present

## 2020-01-17 DIAGNOSIS — O4200 Premature rupture of membranes, onset of labor within 24 hours of rupture, unspecified weeks of gestation: Secondary | ICD-10-CM

## 2020-01-17 DIAGNOSIS — Z20822 Contact with and (suspected) exposure to covid-19: Secondary | ICD-10-CM | POA: Diagnosis present

## 2020-01-17 DIAGNOSIS — O24410 Gestational diabetes mellitus in pregnancy, diet controlled: Secondary | ICD-10-CM

## 2020-01-17 DIAGNOSIS — J4520 Mild intermittent asthma, uncomplicated: Secondary | ICD-10-CM

## 2020-01-17 DIAGNOSIS — J45909 Unspecified asthma, uncomplicated: Secondary | ICD-10-CM | POA: Diagnosis present

## 2020-01-17 DIAGNOSIS — O4210 Premature rupture of membranes, onset of labor more than 24 hours following rupture, unspecified weeks of gestation: Secondary | ICD-10-CM | POA: Diagnosis present

## 2020-01-17 DIAGNOSIS — D649 Anemia, unspecified: Secondary | ICD-10-CM | POA: Diagnosis present

## 2020-01-17 DIAGNOSIS — O4292 Full-term premature rupture of membranes, unspecified as to length of time between rupture and onset of labor: Secondary | ICD-10-CM | POA: Diagnosis present

## 2020-01-17 DIAGNOSIS — O24420 Gestational diabetes mellitus in childbirth, diet controlled: Secondary | ICD-10-CM | POA: Diagnosis present

## 2020-01-17 DIAGNOSIS — O4212 Full-term premature rupture of membranes, onset of labor more than 24 hours following rupture: Secondary | ICD-10-CM | POA: Diagnosis not present

## 2020-01-17 DIAGNOSIS — Z8632 Personal history of gestational diabetes: Secondary | ICD-10-CM | POA: Diagnosis present

## 2020-01-17 DIAGNOSIS — O26893 Other specified pregnancy related conditions, third trimester: Secondary | ICD-10-CM | POA: Diagnosis present

## 2020-01-17 LAB — GLUCOSE, CAPILLARY: Glucose-Capillary: 114 mg/dL — ABNORMAL HIGH (ref 70–99)

## 2020-01-17 LAB — CBC WITH DIFFERENTIAL/PLATELET
Abs Immature Granulocytes: 0.04 10*3/uL (ref 0.00–0.07)
Basophils Absolute: 0 10*3/uL (ref 0.0–0.1)
Basophils Relative: 0 %
Eosinophils Absolute: 0.1 10*3/uL (ref 0.0–0.5)
Eosinophils Relative: 1 %
HCT: 34.4 % — ABNORMAL LOW (ref 36.0–46.0)
Hemoglobin: 11.6 g/dL — ABNORMAL LOW (ref 12.0–15.0)
Immature Granulocytes: 0 %
Lymphocytes Relative: 31 %
Lymphs Abs: 2.7 10*3/uL (ref 0.7–4.0)
MCH: 27.2 pg (ref 26.0–34.0)
MCHC: 33.7 g/dL (ref 30.0–36.0)
MCV: 80.8 fL (ref 80.0–100.0)
Monocytes Absolute: 0.4 10*3/uL (ref 0.1–1.0)
Monocytes Relative: 4 %
Neutro Abs: 5.7 10*3/uL (ref 1.7–7.7)
Neutrophils Relative %: 64 %
Platelets: 257 10*3/uL (ref 150–400)
RBC: 4.26 MIL/uL (ref 3.87–5.11)
RDW: 14.1 % (ref 11.5–15.5)
WBC: 8.9 10*3/uL (ref 4.0–10.5)
nRBC: 0 % (ref 0.0–0.2)

## 2020-01-17 LAB — RESPIRATORY PANEL BY RT PCR (FLU A&B, COVID)
Influenza A by PCR: NEGATIVE
Influenza B by PCR: NEGATIVE
SARS Coronavirus 2 by RT PCR: NEGATIVE

## 2020-01-17 LAB — TYPE AND SCREEN
ABO/RH(D): AB POS
Antibody Screen: NEGATIVE

## 2020-01-17 LAB — RUPTURE OF MEMBRANE (ROM)PLUS: Rom Plus: POSITIVE

## 2020-01-17 MED ORDER — ONDANSETRON HCL 4 MG/2ML IJ SOLN: 4.0000 mg | Freq: Four times a day (QID) | INTRAMUSCULAR | Status: DC | PRN

## 2020-01-17 MED ORDER — OXYCODONE-ACETAMINOPHEN 5-325 MG PO TABS: 1.0000 | ORAL_TABLET | ORAL | Status: DC | PRN

## 2020-01-17 MED ORDER — ACETAMINOPHEN 325 MG PO TABS: 650.0000 mg | ORAL_TABLET | ORAL | Status: DC | PRN

## 2020-01-17 MED ORDER — SODIUM CHLORIDE 0.9 % IV SOLN
1.0000 g | INTRAVENOUS | Status: DC
  Administered 2020-01-18 (×2): 1 g via INTRAVENOUS
  Filled 2020-01-17 (×7): qty 1000

## 2020-01-17 MED ORDER — LIDOCAINE HCL (PF) 1 % IJ SOLN: 30.0000 mL | INTRAMUSCULAR | Status: DC | PRN

## 2020-01-17 MED ORDER — OXYTOCIN 40 UNITS IN NORMAL SALINE INFUSION - SIMPLE MED
2.5000 [IU]/h | INTRAVENOUS | Status: DC
  Filled 2020-01-17: qty 1000

## 2020-01-17 MED ORDER — SOD CITRATE-CITRIC ACID 500-334 MG/5ML PO SOLN: 30.0000 mL | ORAL | Status: DC | PRN

## 2020-01-17 MED ORDER — LIDOCAINE HCL (PF) 1 % IJ SOLN
INTRAMUSCULAR | Status: AC
  Filled 2020-01-17: qty 30

## 2020-01-17 MED ORDER — TERBUTALINE SULFATE 1 MG/ML IJ SOLN: 0.2500 mg | Freq: Once | INTRAMUSCULAR | Status: DC | PRN

## 2020-01-17 MED ORDER — LACTATED RINGERS IV SOLN: 500.0000 mL | INTRAVENOUS | Status: DC | PRN

## 2020-01-17 MED ORDER — MISOPROSTOL 200 MCG PO TABS
ORAL_TABLET | ORAL | Status: AC
  Filled 2020-01-17: qty 2

## 2020-01-17 MED ORDER — MISOPROSTOL 100 MCG PO TABS
50.0000 ug | ORAL_TABLET | ORAL | Status: DC
  Administered 2020-01-17 – 2020-01-18 (×2): 50 ug via ORAL
  Filled 2020-01-17 (×6): qty 1

## 2020-01-17 MED ORDER — TERBUTALINE SULFATE 1 MG/ML IJ SOLN: 0.2500 mg | Freq: Once | INTRAMUSCULAR | Status: DC

## 2020-01-17 MED ORDER — OXYTOCIN BOLUS FROM INFUSION
500.0000 mL | Freq: Once | INTRAVENOUS | Status: AC
  Administered 2020-01-18: 500 mL via INTRAVENOUS

## 2020-01-17 MED ORDER — SODIUM CHLORIDE 0.9 % IV SOLN
2.0000 g | Freq: Once | INTRAVENOUS | Status: AC
  Administered 2020-01-17: 21:00:00 2 g via INTRAVENOUS
  Filled 2020-01-17: qty 2000

## 2020-01-17 MED ORDER — LACTATED RINGERS IV SOLN: INTRAVENOUS | Status: DC

## 2020-01-17 MED ORDER — BUTORPHANOL TARTRATE 1 MG/ML IJ SOLN
1.0000 mg | INTRAMUSCULAR | Status: DC | PRN
  Administered 2020-01-18: 1 mg via INTRAVENOUS
  Filled 2020-01-17: qty 1

## 2020-01-17 MED ORDER — AMMONIA AROMATIC IN INHA
RESPIRATORY_TRACT | Status: AC
  Filled 2020-01-17: qty 10

## 2020-01-17 MED ORDER — OXYTOCIN 10 UNIT/ML IJ SOLN
INTRAMUSCULAR | Status: AC
  Filled 2020-01-17: qty 2

## 2020-01-17 MED ORDER — OXYCODONE-ACETAMINOPHEN 5-325 MG PO TABS: 2.0000 | ORAL_TABLET | ORAL | Status: DC | PRN

## 2020-01-17 NOTE — H&P (Signed)
Obstetric History and Physical  Christy Farmer is a 34 y.o. G2P1001 with IUP at [redacted]w[redacted]d presenting for complaints of watery vaginal discharge soaking through underwear since yesterday afternoon. Patient states she has been having not been having contractions, none vaginal bleeding, with active fetal movement.    Prenatal Course Source of Care: [facility] with onset of care at 10 weeks Pregnancy complications or risks: Patient Active Problem List   Diagnosis Date Noted  . Premature rupture of membranes (PROM) following amniocentesis resealed during pregnancy in third trimester 01/17/2020  . Prolonged premature rupture of membranes 01/17/2020  . Viral hepatitis A without hepatic coma 10/18/2017  . Jaundice 09/16/2017  . Well woman exam without gynecological exam 11/17/2016  . Contact dermatitis 04/28/2016  . Skin rash 04/24/2016  . Annual physical exam 12/01/2015  . Sore throat 12/01/2015  . Viral pharyngitis 08/18/2015  . Encounter to establish care with new doctor 08/13/2015  . Asthma 08/13/2015  . History of abnormal cervical Pap smear 06/15/2015  . Enlarged uterus 06/15/2015  . Menorrhagia 06/15/2015  . Dysmenorrhea 06/15/2015   She plans to breastfeed She desires undecided method for postpartum contraception.   Prenatal labs and studies: ABO, Rh: AB/Positive/-- July 13, 2023 1549) Antibody: Negative 07/13/23 1549) Rubella: 1.59 July 13, 2023 1549) RPR: Non Reactive (01/13 0829)  HBsAg: Negative Jul 13, 2023 1549)  HIV: Non Reactive 2023/07/13 1549)  TJQ:ZESPQZRA/-- (03/17 1543) 1 hr Glucola  Abnormal; 3 hr Glucola abnormal.  Genetic screening normal Anatomy US normal   Past Medical History:  Diagnosis Date  . Abnormal uterine bleeding (AUB)   . Allergy    seasonal allergies.  . Asthma   . Constipation   . Enlarged uterus   . Gestational diabetes   . Heavy periods   . Hepatitis A 09/16/2017  . Vaginal Pap smear, abnormal     Past Surgical History:  Procedure Laterality  Date  . ANKLE SURGERY    . COLPOSCOPY      OB History  Gravida Para Term Preterm AB Living  2 1 1     1   SAB TAB Ectopic Multiple Live Births          1    # Outcome Date GA Lbr Len/2nd Weight Sex Delivery Anes PTL Lv  2 Current           1 Term 06/14/03   2977 g M   N LIV    Social History   Socioeconomic History  . Marital status: Single    Spouse name: Not on file  . Number of children: Not on file  . Years of education: Not on file  . Highest education level: Not on file  Occupational History  . Not on file  Tobacco Use  . Smoking status: Never Smoker  . Smokeless tobacco: Never Used  Substance and Sexual Activity  . Alcohol use: No    Alcohol/week: 2.0 standard drinks    Types: 2 Glasses of wine per week  . Drug use: No  . Sexual activity: Not Currently    Birth control/protection: None  Other Topics Concern  . Not on file  Social History Narrative  . Not on file   Social Determinants of Health   Financial Resource Strain:   . Difficulty of Paying Living Expenses:   Food Insecurity:   . Worried About 2978 in the Last Year:   . Programme researcher, broadcasting/film/video in the Last Year:   Transportation Needs:   . Lack of Transportation (  Medical):   Marland Kitchen Lack of Transportation (Non-Medical):   Physical Activity:   . Days of Exercise per Week:   . Minutes of Exercise per Session:   Stress:   . Feeling of Stress :   Social Connections:   . Frequency of Communication with Friends and Family:   . Frequency of Social Gatherings with Friends and Family:   . Attends Religious Services:   . Active Member of Clubs or Organizations:   . Attends Archivist Meetings:   Marland Kitchen Marital Status:     Family History  Problem Relation Age of Onset  . Hyperlipidemia Mother   . Hypertension Mother   . Healthy Father   . Healthy Brother   . Heart attack Maternal Grandmother   . Cancer Neg Hx   . Diabetes Neg Hx   . Heart disease Neg Hx     Medications Prior to  Admission  Medication Sig Dispense Refill Last Dose  . Prenatal Vit-Fe Fumarate-FA (PRENATAL MULTIVITAMIN) TABS tablet Take 1 tablet by mouth daily at 12 noon.   01/16/2020 at Unknown time  . acetaminophen (TYLENOL) 500 MG tablet Take 500 mg by mouth every 6 (six) hours as needed.   Not Taking at Unknown time  . albuterol (PROVENTIL HFA;VENTOLIN HFA) 108 (90 Base) MCG/ACT inhaler Inhale 2 puffs into the lungs every 6 (six) hours as needed for wheezing or shortness of breath. (Patient not taking: Reported on 01/17/2020) 1 Inhaler 0 Not Taking at Unknown time  . fluticasone (FLONASE) 50 MCG/ACT nasal spray Place 2 sprays into both nostrils daily. (Patient taking differently: Place 2 sprays into both nostrils as needed. ) 16 g 6   . glucose blood test strip Patient needs One Touch Verio Flex Test Strips 4 times daily. 100 each 12   . Lancets (ONETOUCH DELICA PLUS [id number]) MISC 1 Device by Does not apply route 4 (four) times daily. 100 each 2     No Known Allergies  Review of Systems: Negative except for what is mentioned in HPI.  Physical Exam: Pulse 78   Temp 98.4 F (36.9 C) (Oral)   Resp 15   LMP 04/30/2019  CONSTITUTIONAL: Well-developed, well-nourished female in no acute distress.  HENT:  Normocephalic, atraumatic, External right and left ear normal. Oropharynx is clear and moist EYES: Conjunctivae and EOM are normal. Pupils are equal, round, and reactive to light. No scleral icterus.  NECK: Normal range of motion, supple, no masses SKIN: Skin is warm and dry. No rash noted. Not diaphoretic. No erythema. No pallor. NEUROLOGIC: Alert and oriented to person, place, and time. Normal reflexes, muscle tone coordination. No cranial nerve deficit noted. PSYCHIATRIC: Normal mood and affect. Normal behavior. Normal judgment and thought content. CARDIOVASCULAR: Normal heart rate noted, regular rhythm RESPIRATORY: Effort and breath sounds normal, no problems with respiration noted ABDOMEN: Soft,  nontender, nondistended, gravid. MUSCULOSKELETAL: Normal range of motion. No edema and no tenderness. 2+ distal pulses.  Cervical Exam: Dilatation 1.5 cm   Effacement 30%   Station -3 (per office exam 01/13/2020). Deferred exam due to prolonged rupture.   Presentation: cephalic FHT:  Baseline rate 120 bpm   Variability moderate  Accelerations present   Decelerations none Contractions: Rare contraction noted.    Pertinent Labs/Studies:   Results for orders placed or performed during the hospital encounter of 01/17/20 (from the past 24 hour(s))  ROM Plus ([place] only)     Status: None   Collection Time: 01/17/20  6:39 PM  Result Value Ref  Range   Rom Plus POSITIVE   CBC with Differential/Platelet     Status: Abnormal   Collection Time: 01/17/20  7:57 PM  Result Value Ref Range   WBC 8.9 4.0 - 10.5 K/uL   RBC 4.26 3.87 - 5.11 MIL/uL   Hemoglobin 11.6 (L) 12.0 - 15.0 g/dL   HCT 23.5 (L) 57.3 - 22.0 %   MCV 80.8 80.0 - 100.0 fL   MCH 27.2 26.0 - 34.0 pg   MCHC 33.7 30.0 - 36.0 g/dL   RDW 25.4 27.0 - 62.3 %   Platelets 257 150 - 400 K/uL   nRBC 0.0 0.0 - 0.2 %   Neutrophils Relative % 64 %   Neutro Abs 5.7 1.7 - 7.7 K/uL   Lymphocytes Relative 31 %   Lymphs Abs 2.7 0.7 - 4.0 K/uL   Monocytes Relative 4 %   Monocytes Absolute 0.4 0.1 - 1.0 K/uL   Eosinophils Relative 1 %   Eosinophils Absolute 0.1 0.0 - 0.5 K/uL   Basophils Relative 0 %   Basophils Absolute 0.0 0.0 - 0.1 K/uL   Immature Granulocytes 0 %   Abs Immature Granulocytes 0.04 0.00 - 0.07 K/uL  Type and screen     Status: None (Preliminary result)   Collection Time: 01/17/20  7:57 PM  Result Value Ref Range   ABO/RH(D) PENDING    Antibody Screen PENDING    Sample Expiration      01/20/2020,2359 Performed at [facility], [address], [place], [place] [address]   Respiratory Panel by RT PCR (Flu A&B, Covid) - Nasopharyngeal Swab     Status: None   Collection Time: 01/17/20  7:57 PM   Specimen:  Nasopharyngeal Swab  Result Value Ref Range   SARS Coronavirus 2 by RT PCR NEGATIVE NEGATIVE   Influenza A by PCR NEGATIVE NEGATIVE   Influenza B by PCR NEGATIVE NEGATIVE  Glucose, capillary     Status: Abnormal   Collection Time: 01/17/20 10:13 PM  Result Value Ref Range   Glucose-Capillary 114 (H) 70 - 99 mg/dL    Assessment : Mireille Blondine Hottel is a 34 y.o. G2P1001 at [redacted]w[redacted]d being admitted for induction of labor due to prolonged premature rupture of membranes. Gestational diabetes, diet controlled.   Plan: Labor: Admission labs ordered.  Induction as ordered as per protocol with Cytotec. Analgesia as needed.  Will start antibiotics (Ancef) for h/o prolonged rupture of membranes (> 24 hrs). Accuchecks q 4 hrs.  FWB: Reassuring fetal heart tracing.  GBS negative Delivery plan: Hopeful for vaginal delivery   Hildred Laser, MD [facility]

## 2020-01-17 NOTE — OB Triage Note (Signed)
Pt. reports irregular contractions for the past few days, pain in the lower back (4/10) and abdomen (3/10) intermittent. Also reports decreased fetal movement, stating she has not felt baby move since last night. States she has had moderate, watery vaginal discharge since yesterday as well; enough to soak through underwear. Vital signs stable and FM/Toco applied. Will continue to monitor.

## 2020-01-17 NOTE — Plan of Care (Signed)
  Problem: Education: Goal: Knowledge of Childbirth will improve Outcome: Progressing Goal: Ability to make informed decisions regarding treatment and plan of care will improve Outcome: Progressing Goal: Ability to state and carry out methods to decrease the pain will improve Outcome: Progressing   Problem: Life Cycle: Goal: Ability to make normal progression through stages of labor will improve Outcome: Progressing   Problem: Safety: Goal: Risk of complications during labor and delivery will decrease Outcome: Progressing   Problem: Pain Management: Goal: Relief or control of pain from uterine contractions will improve Outcome: Progressing

## 2020-01-18 ENCOUNTER — Encounter: Payer: Self-pay | Admitting: Obstetrics and Gynecology

## 2020-01-18 DIAGNOSIS — O4212 Full-term premature rupture of membranes, onset of labor more than 24 hours following rupture: Secondary | ICD-10-CM

## 2020-01-18 DIAGNOSIS — O24420 Gestational diabetes mellitus in childbirth, diet controlled: Secondary | ICD-10-CM

## 2020-01-18 LAB — RPR: RPR Ser Ql: NONREACTIVE

## 2020-01-18 LAB — ABO/RH: ABO/RH(D): AB POS

## 2020-01-18 LAB — GLUCOSE, CAPILLARY
Glucose-Capillary: 135 mg/dL — ABNORMAL HIGH (ref 70–99)
Glucose-Capillary: 96 mg/dL (ref 70–99)

## 2020-01-18 MED ORDER — PRENATAL MULTIVITAMIN CH
1.0000 | ORAL_TABLET | Freq: Every day | ORAL | Status: DC
  Administered 2020-01-18 – 2020-01-19 (×2): 1 via ORAL
  Filled 2020-01-18 (×2): qty 1

## 2020-01-18 MED ORDER — COCONUT OIL OIL
1.0000 "application " | TOPICAL_OIL | Status: DC | PRN
  Filled 2020-01-18: qty 120

## 2020-01-18 MED ORDER — SENNOSIDES-DOCUSATE SODIUM 8.6-50 MG PO TABS
2.0000 | ORAL_TABLET | ORAL | Status: DC
  Administered 2020-01-18: 23:00:00 2 via ORAL
  Filled 2020-01-18 (×2): qty 2

## 2020-01-18 MED ORDER — WITCH HAZEL-GLYCERIN EX PADS: 1.0000 "application " | MEDICATED_PAD | CUTANEOUS | Status: DC | PRN

## 2020-01-18 MED ORDER — ONDANSETRON HCL 4 MG PO TABS: 4.0000 mg | ORAL_TABLET | ORAL | Status: DC | PRN

## 2020-01-18 MED ORDER — SIMETHICONE 80 MG PO CHEW: 80.0000 mg | CHEWABLE_TABLET | ORAL | Status: DC | PRN

## 2020-01-18 MED ORDER — DIBUCAINE (PERIANAL) 1 % EX OINT: 1.0000 "application " | TOPICAL_OINTMENT | CUTANEOUS | Status: DC | PRN

## 2020-01-18 MED ORDER — ONDANSETRON HCL 4 MG/2ML IJ SOLN: 4.0000 mg | INTRAMUSCULAR | Status: DC | PRN

## 2020-01-18 MED ORDER — ACETAMINOPHEN 325 MG PO TABS: 650.0000 mg | ORAL_TABLET | ORAL | Status: DC | PRN

## 2020-01-18 MED ORDER — IBUPROFEN 600 MG PO TABS
600.0000 mg | ORAL_TABLET | Freq: Four times a day (QID) | ORAL | Status: DC
  Administered 2020-01-18 – 2020-01-19 (×5): 600 mg via ORAL
  Filled 2020-01-18 (×7): qty 1

## 2020-01-18 MED ORDER — DIPHENHYDRAMINE HCL 25 MG PO CAPS: 25.0000 mg | ORAL_CAPSULE | Freq: Four times a day (QID) | ORAL | Status: DC | PRN

## 2020-01-18 MED ORDER — BENZOCAINE-MENTHOL 20-0.5 % EX AERO
1.0000 "application " | INHALATION_SPRAY | CUTANEOUS | Status: DC | PRN
  Administered 2020-01-19: 1 via TOPICAL
  Filled 2020-01-18: qty 56

## 2020-01-18 MED ORDER — ZOLPIDEM TARTRATE 5 MG PO TABS: 5.0000 mg | ORAL_TABLET | Freq: Every evening | ORAL | Status: DC | PRN

## 2020-01-18 MED ORDER — OXYTOCIN 40 UNITS IN NORMAL SALINE INFUSION - SIMPLE MED: 1.0000 m[IU]/min | INTRAVENOUS | Status: DC

## 2020-01-18 NOTE — Plan of Care (Signed)
  Problem: Role Relationship: Goal: Will demonstrate positive interactions with the child Outcome: Progressing

## 2020-01-18 NOTE — Lactation Note (Signed)
This note was copied from a baby's chart. Lactation Consultation Note  Patient Name: Christy Farmer [id number]'[id number] Date: 01/18/2020 Reason for consult: Follow-up assessment;Mother's request;Early term 37-38.6wks;Other (Comment)(Did not breast feed first baby 16 years ago)  Assisted mom in comfortable position with pillow support skin to skin in football hold.  [address] Al[place] Bailiff was demonstrating feeding cues which were pointed out to parents and encouraged mom to put her to the breast whenever she demonstrated hunger cues.  Once we got her near the breast, she fell asleep.  Demonstrated hand expression several times before seeing a drop of colostrum to entice [name] to wake and feed.  Kept tickling lips with mom's nipple until she opened wide.  Initially she had a shallow latch which mom could feel as uncomfortable pinching.  Demonstrated how to gently touch chin for wider mouth.  Once we got deep enough latch, [name] started suckling with an infrequent swallow.  Frequent stimulation and breast massage was required to keep him actively sucking for 12 minutes before she fell asleep and started slipping off to the tip of the nipple.  Mom was 67 years old when she had her first baby who is now 19 years old and did not breast feed her.  Parents had lots of questions about breast feeding such as how to know she is getting enough, when to start pumping (mom not going back to work for 12 weeks), when to introduce a pacifier and/or bottle, how much [name] should be taking from the breast, mom's diet, etc which were addressed.  Mom has a nursing pillow that she had questions about how to use.  Mom has already gotten a Motiff pump from her [employer].  Reviewed normal newborn stomach size, how many voids and stools expected, supply and demand, normal course of lactation and routine newborn feeding patterns.  Hand out on community lactation resources given with contact numbers and reviewed.  Lactation name and number written  on white board and encouraged to call with any questions, concerns or assistance.   Maternal Data Formula Feeding for Exclusion: No Has patient been taught Hand Expression?: Yes Does the patient have breastfeeding experience prior to this delivery?: No(Did not BF 74 year old (Mom was only 68 y.o.))  Feeding Feeding Type: Breast Fed  LATCH Score Latch: Repeated attempts needed to sustain latch, nipple held in mouth throughout feeding, stimulation needed to elicit sucking reflex.  Audible Swallowing: A few with stimulation  Type of Nipple: Everted at rest and after stimulation  Comfort (Breast/Nipple): Soft / non-tender  Hold (Positioning): Assistance needed to correctly position infant at breast and maintain latch.  LATCH Score: 7  Interventions Interventions: Breast feeding basics reviewed;Assisted with latch;Skin to skin;Breast massage;Hand express;Reverse pressure;Breast compression;Adjust position;Support pillows;Position options  Lactation Tools Discussed/Used WIC Program: No([employer])   Consult Status Consult Status: Follow-up Date: 01/18/20 Follow-up type: Call as needed    Louis Meckel 01/18/2020, 5:13 PM

## 2020-01-19 ENCOUNTER — Encounter: Payer: Self-pay | Admitting: Obstetrics and Gynecology

## 2020-01-19 LAB — CBC
HCT: 30.2 % — ABNORMAL LOW (ref 36.0–46.0)
Hemoglobin: 10.1 g/dL — ABNORMAL LOW (ref 12.0–15.0)
MCH: 27.4 pg (ref 26.0–34.0)
MCHC: 33.4 g/dL (ref 30.0–36.0)
MCV: 81.8 fL (ref 80.0–100.0)
Platelets: 240 10*3/uL (ref 150–400)
RBC: 3.69 MIL/uL — ABNORMAL LOW (ref 3.87–5.11)
RDW: 14.3 % (ref 11.5–15.5)
WBC: 10.7 10*3/uL — ABNORMAL HIGH (ref 4.0–10.5)
nRBC: 0 % (ref 0.0–0.2)

## 2020-01-19 MED ORDER — BISACODYL 10 MG RE SUPP
10.0000 mg | Freq: Every day | RECTAL | Status: DC | PRN
  Administered 2020-01-19: 10 mg via RECTAL
  Filled 2020-01-19: qty 1

## 2020-01-19 MED ORDER — IBUPROFEN 800 MG PO TABS: 800.0000 mg | ORAL_TABLET | Freq: Three times a day (TID) | ORAL | 1 refills | Status: DC | PRN

## 2020-01-19 NOTE — Discharge Summary (Addendum)
OB Discharge Summary     Patient Name: Christy Farmer DOB: 09/01/1986 MRN: [id number]  Date of admission: 01/17/2020 Delivering MD: Hildred Laser   Date of discharge: 01/20/2020  Admitting diagnosis: Prolonged premature rupture of membranes [O42.00] Intrauterine pregnancy: [redacted]w[redacted]d     Secondary diagnosis:  Active Problems: Gestational Diabetes, diet controlled [O24.410]  Additional problems: History of asthma     Discharge diagnosis: Term Pregnancy Delivered, GDM A1 and Anemia                                                                                                Post partum procedures:None  Augmentation: Cytotec  Complications: None  Hospital course:  Induction of Labor With Vaginal Delivery   34 y.o. yo G2P2002 at [redacted]w[redacted]d was admitted to the hospital 01/17/2020 for induction of labor.  Indication for induction: PROM (prolonged).  Patient had an uncomplicated labor course as follows: Membrane Rupture Time/Date: 1:00 PM ,01/16/2020   Intrapartum Procedures: Episiotomy: None [1]                                         Lacerations:  1st degree [2]  Patient had delivery of a Viable infant.  Information for the patient's newborn:  Threasa Kinch, Girl Odelle [[id number]]  Delivery Method: Vag-Spont    01/18/2020  Details of delivery can be found in separate delivery note.  Patient had a routine postpartum course. Patient is discharged home 01/20/20.  Physical exam  Vitals:   01/19/20 0806 01/19/20 1739 01/19/20 2312 01/20/20 0810  BP: 111/65 (!) 105/57 123/66 131/86  Pulse: 75 75 72 63  Resp: 18 18 16 18   Temp: 98 F (36.7 C) 98.7 F (37.1 C) 98.6 F (37 C) 97.7 F (36.5 C)  TempSrc: Oral Oral Oral   SpO2: 100% 99% 100% 99%  Weight:      Height:       General: alert and no distress Lochia: appropriate Uterine Fundus: firm Incision: N/A DVT Evaluation: No evidence of DVT seen on physical exam. Negative Homan's sign. No cords or calf tenderness. No significant  calf/ankle edema.   Labs: Lab Results  Component Value Date   WBC 10.7 (H) 01/19/2020   HGB 10.1 (L) 01/19/2020   HCT 30.2 (L) 01/19/2020   MCV 81.8 01/19/2020   PLT 240 01/19/2020   CMP Latest Ref Rng & Units 03/26/2019  Glucose 65 - 99 mg/dL -  BUN 6 - 20 mg/dL -  Creatinine 05/26/2019 - 6.59 mg/dL -  Sodium 9.35 - 701 mmol/L -  Potassium 3.5 - 5.1 mmol/L -  Chloride 101 - 111 mmol/L -  CO2 22 - 32 mmol/L -  Calcium 8.9 - 10.3 mg/dL -  Total Protein 6.0 - 8.5 g/dL 6.8  Total Bilirubin 0.0 - 1.2 mg/dL 0.3  Alkaline Phos 39 - 117 IU/L 42  AST 0 - 40 IU/L 17  ALT 0 - 32 IU/L 12    Edinburgh Postnatal Depression Scale Screening Tool 01/19/2020 01/19/2020 01/18/2020  I have been able to laugh and see the funny side of things. 0 (No Data) (No Data)  I have looked forward with enjoyment to things. 0 - -  I have blamed myself unnecessarily when things went wrong. 1 - -  I have been anxious or worried for no good reason. 0 - -  I have felt scared or panicky for no good reason. 0 - -  Things have been getting on top of me. 1 - -  I have been so unhappy that I have had difficulty sleeping. 0 - -  I have felt sad or miserable. 0 - -  I have been so unhappy that I have been crying. 0 - -  The thought of harming myself has occurred to me. 0 - -  Edinburgh Postnatal Depression Scale Total 2 - -     Discharge instruction: per After Visit Summary.  After visit meds:  Allergies as of 01/19/2020   No Known Allergies     Medication List    STOP taking these medications   glucose blood test strip   OneTouch Delica Plus [id number] Misc     TAKE these medications   acetaminophen 500 MG tablet Commonly known as: TYLENOL Take 500 mg by mouth every 6 (six) hours as needed.   albuterol 108 (90 Base) MCG/ACT inhaler Commonly known as: VENTOLIN HFA Inhale 2 puffs into the lungs every 6 (six) hours as needed for wheezing or shortness of breath.   fluticasone 50 MCG/ACT nasal spray Commonly  known as: FLONASE Place 2 sprays into both nostrils daily. What changed:   when to take this  reasons to take this   ibuprofen 800 MG tablet Commonly known as: ADVIL Take 1 tablet (800 mg total) by mouth every 8 (eight) hours as needed.   prenatal multivitamin Tabs tablet Take 1 tablet by mouth daily at 12 noon.       Diet: routine diet  Activity: Advance as tolerated. Pelvic rest for 6 weeks.   Outpatient follow up:6 weeks Follow up Appt: Future Appointments  Date Time Provider [place]  01/21/2020  3:15 PM Harlin Heys, MD [facility] None   Follow up Visit:No follow-ups on file.  Postpartum contraception: Undecided  Newborn Data: Live born female  Birth Weight: 6 lb 13.2 oz (3095 g) APGAR: 8, 9  Newborn Delivery   Birth date/time: 01/18/2020 04:45:00 Delivery type: Vaginal, Spontaneous      Baby Feeding: Breast Disposition:home with mother   01/19/2020 Rubie Maid, MD  [facility]

## 2020-01-19 NOTE — Lactation Note (Signed)
This note was copied from a baby's chart. Lactation Consultation Note  Patient Name: Christy Farmer [id number] Date: 01/19/2020 Reason for consult: Follow-up assessment   Maternal Data Formula Feeding for Exclusion: No Has patient been taught Hand Expression?: Yes Does the patient have breastfeeding experience prior to this delivery?: No  Feeding Feeding Type: Breast Fed Baby sl more aggressive this feeding on left at first, less stimulation needed at first, but unable to sustain much sucking after first five min on left, attempted on right, would latch and suck a few times then fall asleep, mom to rest then attempt again   [facility] Score Latch: Grasps breast easily, tongue down, lips flanged, rhythmical sucking.  Audible Swallowing: A few with stimulation  Type of Nipple: Everted at rest and after stimulation  Comfort (Breast/Nipple): Filling, red/small blisters or bruises, mild/mod discomfort  Hold (Positioning): Assistance needed to correctly position infant at breast and maintain latch.  LATCH Score: 7  Interventions Interventions: Assisted with latch;Skin to skin;Breast massage;Hand express  Lactation Tools Discussed/Used WIC Program: No   Consult Status Consult Status: PRN Date: 01/19/20 Follow-up type: In-patient    Dyann Kief 01/19/2020, 2:44 PM

## 2020-01-19 NOTE — Lactation Note (Signed)
This note was copied from a baby's chart. Lactation Consultation Note  Patient Name: Girl Aarvi Tatianna Ibbotson [id number] Date: 01/19/2020 Reason for consult: Follow-up assessment Small amt meconium in diaper  Maternal Data  mom able to latch baby herself, with occ help in getting deeper latch  Feeding Feeding Type: Breast Fed Still needs stimulation to continue to suck but less than other feedings  LATCH Score Latch: Grasps breast easily, tongue down, lips flanged, rhythmical sucking.  Audible Swallowing: A few with stimulation  Type of Nipple: Everted at rest and after stimulation  Comfort (Breast/Nipple): Filling, red/small blisters or bruises, mild/mod discomfort  Hold (Positioning): Assistance needed to correctly position infant at breast and maintain latch.  LATCH Score: 7  Interventions Interventions: Assisted with latch;Skin to skin;Breast massage;Hand express;Adjust position;Support pillows  Lactation Tools Discussed/Used  attempt breastfeeding every 2 hrs or sooner if cues   Consult Status Consult Status: Follow-up Date: 01/20/20 Follow-up type: In-patient    Dyann Kief 01/19/2020, 7:24 PM

## 2020-01-19 NOTE — Lactation Note (Addendum)
This note was copied from a baby's chart. Lactation Consultation Note  Patient Name: Christy Farmer [id number]'X Date: 01/19/2020 Reason for consult: Follow-up assessment  no bowel movt since birth.  Maternal Data Formula Feeding for Exclusion: No Has patient been taught Hand Expression?: Yes Does the patient have breastfeeding experience prior to this delivery?: Yes  Feeding Feeding Type: Breast Fed Needed lots of stimulation to continue to suck, falls asleep easily at breast, shows cues when not at breast, then latches easily sucks a few times and holds nipple in mouth, mom in side lying position with baby beside her, helps with keeping baby awake enough to suck, mom feels uterine cramping with nursing, swallows heard when baby stimulated to suck, baby passing gas   LATCH Score Latch: Grasps breast easily, tongue down, lips flanged, rhythmical sucking.  Audible Swallowing: A few with stimulation  Type of Nipple: Everted at rest and after stimulation  Comfort (Breast/Nipple): Filling, red/small blisters or bruises, mild/mod discomfort  Hold (Positioning): Assistance needed to correctly position infant at breast and maintain latch.  LATCH Score: 7  Interventions Interventions: Assisted with latch;Skin to skin;Breast massage;Hand express;Adjust position;Support pillows  Lactation Tools Discussed/Used WIC Program: No Encouraged mom to offer breast every 1-2 hrs, especially if baby showing feeding cues LC name and no written on white board  Consult Status Consult Status: PRN Date: 01/19/20 Follow-up type: In-patient    Christy Farmer 01/19/2020, 11:55 AM

## 2020-01-19 NOTE — Progress Notes (Signed)
Post Partum Day # 1, s/p SVD. Gestational DM, diet controlled.   Subjective: no complaints, up ad lib, voiding and tolerating PO. Reports breastfeeding has been somewhat challenging but infant finally learning to latch. Reports bleeding is normal.   Objective:        Vitals:   01/18/20 1620 01/18/20 1929 01/18/20 2324 01/19/20 0806  BP: 126/88 122/81 119/63 111/65  Pulse: 80 66 76 75  Resp: 18 20 20 18   Temp: 98.6 F (37 C) 98 F (36.7 C) 98.3 F (36.8 C) 98 F (36.7 C)  TempSrc: Oral Oral Oral Oral  SpO2:  100%  100%  Weight:      Height:        Physical Exam:  General: alert and no distress  Lungs: clear to auscultation bilaterally Breasts: normal appearance, no masses or tenderness Heart: regular rate and rhythm, S1, S2 normal, no murmur, click, rub or gallop Abdomen: soft, non-tender; bowel sounds normal; no masses,  no organomegaly Pelvis: Lochia: appropriate, Uterine Fundus: firm Extremities: DVT Evaluation: No evidence of DVT seen on physical exam. Negative Homan's sign. No cords or calf tenderness. No significant calf/ankle edema.   Recent Labs    01/17/20 1957 01/19/20 0613  HGB 11.6* 10.1*  HCT 34.4* 30.2*    Assessment/Plan: Doing well postpartum Breastfeeding, Lactation consult  Contraception undecided.  Mild anemia of pregnancy. Asymptomatic. Continue PNV with iron.  GDM, can resume normal diet and discontinue Accuchecks. Will f/u postpartum.  Patient desires to d/c home today.     LOS: 2 days   03/20/20, MD [facility] 01/19/2020 8:22 PM

## 2020-01-19 NOTE — Progress Notes (Signed)
Pt educated on proper pump use and cleaning equipment. Pt demonstrated use and had no further questions.

## 2020-01-20 ENCOUNTER — Telehealth: Payer: Self-pay | Admitting: Obstetrics and Gynecology

## 2020-01-20 NOTE — Lactation Note (Signed)
This note was copied from a baby's chart. Lactation Consultation Note  Patient Name: Christy Farmer KYHCW'C Date: 01/20/2020 Reason for consult: Follow-up assessment;1st time breastfeeding;Early term 37-38.6wks  LC in to see mom and baby before discharge. Mom reports continued feeding difficulties during the night where baby is refusing the breast, and sore nipples. LC asked clarifying questions regarding sore nipples; it appears baby may only be latching to the nipple creating soreness and possibly inhibiting adequate milk transfer.  Mom desires to also utilize personal EBP she has at home. LC educates on benefits of EBP for breast stimulation, and assisting with colostrum to transitional milk.  Provided tips on achieving wide open mouth, sandwiching of breast tissue, and pulling baby in tight. LC also provided guidance on calming techniques for baby at night- skin to skin, bouncing, soothing voice, and low lights, to help calm baby down before attempting to latch again. LC also gave information for outpatient lactation support and consult services post discharge, as well as phone number, and community breastfeeding support groups.  Mom may call out for [facility] support before discharge if baby wakes.  Maternal Data Formula Feeding for Exclusion: No Has patient been taught Hand Expression?: Yes Does the patient have breastfeeding experience prior to this delivery?: No(did not BF 34yr old)  Feeding Feeding Type: Breast Fed  LATCH Score                   Interventions Interventions: Breast feeding basics reviewed;DEBP;Comfort gels;Coconut oil  Lactation Tools Discussed/Used     Consult Status Consult Status: PRN Date: 01/20/20 Follow-up type: Call as needed    [name] 01/20/2020, 9:35 AM

## 2020-01-20 NOTE — Lactation Note (Signed)
This note was copied from a baby's chart. Lactation Consultation Note  Patient Name: Christy Farmer VNRWC'H Date: 01/20/2020 Reason for consult: Follow-up assessment;Mother's request;1st time breastfeeding;Early term 37-38.6wks;Hyperbilirubinemia  LC called into room by mom for information re: pump flange size. LC educated parents on determining the right fit, needs for larger size may be needed as BF and/or pumping continues over time. Encouraged to use coconut oil to reduce friction of plastic against skin. Reiterated BF support available post discharge. Mom feeling more confident in breastfeeding after successful feedings today.  Maternal Data Formula Feeding for Exclusion: No Has patient been taught Hand Expression?: Yes Does the patient have breastfeeding experience prior to this delivery?: No  Feeding Feeding Type: Breast Fed  LATCH Score Latch: Grasps breast easily, tongue down, lips flanged, rhythmical sucking.  Audible Swallowing: A few with stimulation  Type of Nipple: Everted at rest and after stimulation  Comfort (Breast/Nipple): Soft / non-tender  Hold (Positioning): Assistance needed to correctly position infant at breast and maintain latch.  LATCH Score: 8  Interventions Interventions: Breast feeding basics reviewed;Hand express;Adjust position;Position options;Support pillows  Lactation Tools Discussed/Used     Consult Status Consult Status: Complete Date: 01/20/20 Follow-up type: Call as needed    [name] 01/20/2020, 12:10 PM

## 2020-01-20 NOTE — Lactation Note (Signed)
This note was copied from a baby's chart. Lactation Consultation Note  Patient Name: Christy Farmer Date: 01/20/2020 Reason for consult: Follow-up assessment;Mother's request;1st time breastfeeding;Early term 37-38.6wks;Hyperbilirubinemia  Mom called back out to [facility] for observed feeding before discharge. Baby was cuing when [facility] entered room. LC and mom discussed positions, mom opting for cross-cradle. LC helped with positioning, support pillows, and direction for sandwiching of breast tissue. Baby grasped the breast easily, LC did assist with pulling down on chin and mom noted improvement in feeling. Baby was difficult to keep stimulated, but LC did observe a swallow, by deeper jaw movement, every 4-6 sucks. Baby fed for 17 minutes before being unable to arouse anymore. Mom unlatched baby and baby remained asleep.  LC provided breastfeeding basics education for days/weeks to come: signs of milk transfer, output expectation, transient nipple soreness, breast fullness/engorgement/mastitis and when to seek help, feeding behaviors, early hunger cues, and soothing techniques. Mom desired to be taught hand expression once more, LC hand expressed on same breast that baby fed, and showed mom drops- rubbed in, and encouraged to let air dry. Reminded parents of support for breastfeeding post discharge, and to call out today if needed again before going home.  Maternal Data Formula Feeding for Exclusion: No Has patient been taught Hand Expression?: Yes Does the patient have breastfeeding experience prior to this delivery?: No  Feeding Feeding Type: Breast Fed  LATCH Score Latch: Grasps breast easily, tongue down, lips flanged, rhythmical sucking.  Audible Swallowing: A few with stimulation  Type of Nipple: Everted at rest and after stimulation  Comfort (Breast/Nipple): Soft / non-tender  Hold (Positioning): Assistance needed to correctly position infant at breast and maintain  latch.  LATCH Score: 8  Interventions Interventions: Breast feeding basics reviewed;Hand express;Adjust position;Position options;Support pillows  Lactation Tools Discussed/Used     Consult Status Consult Status: Complete Date: 01/20/20 Follow-up type: Call as needed    [name] 01/20/2020, 10:07 AM

## 2020-01-20 NOTE — Telephone Encounter (Signed)
Called patient to set up 6 week post partum visit. Scheduling was a success. Informed patient that we needed her FMLA form fee,  In order to process her paperwork. Patient was still in hospital and told me she would call back to make the payment. Informed patient that we wont be able to process any paperwork for her FMLA until her fee is paid. Patient verbalized understanding.

## 2020-01-20 NOTE — Discharge Instructions (Signed)

## 2020-01-20 NOTE — Progress Notes (Signed)
Discharge order received from doctor. Reviewed discharge instructions and prescriptions with patient and answered all questions. Follow up appointment given. Patient verbalized understanding. ID bands checked. Patient discharged home with infant via wheelchair by nursing/auxillary.   Artha Stavros Garner, RN  

## 2020-01-21 ENCOUNTER — Encounter: Payer: Managed Care, Other (non HMO) | Admitting: Obstetrics and Gynecology

## 2020-01-22 DIAGNOSIS — Z0289 Encounter for other administrative examinations: Secondary | ICD-10-CM

## 2020-01-26 ENCOUNTER — Emergency Department
Admission: EM | Admit: 2020-01-26 | Discharge: 2020-01-26 | Disposition: A | Discharge status: Home / Self Care | Payer: Managed Care, Other (non HMO) | Attending: Emergency Medicine | Admitting: Emergency Medicine

## 2020-01-26 ENCOUNTER — Encounter: Payer: Self-pay | Admitting: Emergency Medicine

## 2020-01-26 ENCOUNTER — Other Ambulatory Visit: Payer: Self-pay

## 2020-01-26 ENCOUNTER — Emergency Department: Payer: Managed Care, Other (non HMO)

## 2020-01-26 ENCOUNTER — Inpatient Hospital Stay: Admit: 2020-01-26 | Payer: Self-pay

## 2020-01-26 DIAGNOSIS — Z79899 Other long term (current) drug therapy: Secondary | ICD-10-CM | POA: Insufficient documentation

## 2020-01-26 DIAGNOSIS — R1032 Left lower quadrant pain: Secondary | ICD-10-CM | POA: Insufficient documentation

## 2020-01-26 DIAGNOSIS — R109 Unspecified abdominal pain: Secondary | ICD-10-CM | POA: Diagnosis present

## 2020-01-26 LAB — COMPREHENSIVE METABOLIC PANEL
ALT: 22 U/L (ref 0–44)
AST: 22 U/L (ref 15–41)
Albumin: 3.3 g/dL — ABNORMAL LOW (ref 3.5–5.0)
Alkaline Phosphatase: 113 U/L (ref 38–126)
Anion gap: 9 (ref 5–15)
BUN: 12 mg/dL (ref 6–20)
CO2: 23 mmol/L (ref 22–32)
Calcium: 9.2 mg/dL (ref 8.9–10.3)
Chloride: 106 mmol/L (ref 98–111)
Creatinine, Ser: 0.54 mg/dL (ref 0.44–1.00)
GFR calc Af Amer: >60 mL/min (ref >60)
GFR calc non Af Amer: >60 mL/min (ref >60)
Glucose, Bld: 142 mg/dL — ABNORMAL HIGH (ref 70–99)
Potassium: 3.5 mmol/L (ref 3.5–5.1)
Sodium: 138 mmol/L (ref 135–145)
Total Bilirubin: 0.5 mg/dL (ref 0.3–1.2)
Total Protein: 6.9 g/dL (ref 6.5–8.1)

## 2020-01-26 LAB — CBC WITH DIFFERENTIAL/PLATELET
Abs Immature Granulocytes: 0.05 10*3/uL (ref 0.00–0.07)
Basophils Absolute: 0.1 10*3/uL (ref 0.0–0.1)
Basophils Relative: 1 %
Eosinophils Absolute: 0.3 10*3/uL (ref 0.0–0.5)
Eosinophils Relative: 4 %
HCT: 36.6 % (ref 36.0–46.0)
Hemoglobin: 11.8 g/dL — ABNORMAL LOW (ref 12.0–15.0)
Immature Granulocytes: 1 %
Lymphocytes Relative: 28 %
Lymphs Abs: 2.6 10*3/uL (ref 0.7–4.0)
MCH: 26.9 pg (ref 26.0–34.0)
MCHC: 32.2 g/dL (ref 30.0–36.0)
MCV: 83.4 fL (ref 80.0–100.0)
Monocytes Absolute: 0.4 10*3/uL (ref 0.1–1.0)
Monocytes Relative: 5 %
Neutro Abs: 6 10*3/uL (ref 1.7–7.7)
Neutrophils Relative %: 61 %
Platelets: 436 10*3/uL — ABNORMAL HIGH (ref 150–400)
RBC: 4.39 MIL/uL (ref 3.87–5.11)
RDW: 14.3 % (ref 11.5–15.5)
WBC: 9.5 10*3/uL (ref 4.0–10.5)
nRBC: 0 % (ref 0.0–0.2)

## 2020-01-26 LAB — TYPE AND SCREEN
ABO/RH(D): AB POS
Antibody Screen: NEGATIVE

## 2020-01-26 MED ORDER — METHYLERGONOVINE MALEATE 0.2 MG/ML IJ SOLN
0.2000 mg | Freq: Once | INTRAMUSCULAR | Status: AC
  Administered 2020-01-26: 0.2 mg via INTRAMUSCULAR
  Filled 2020-01-26: qty 1

## 2020-01-26 NOTE — Discharge Instructions (Addendum)
You may take Tylenol or ibuprofen as needed for the pain.  Follow-up with Dr. Valentino Saxon.  Return to the ER for new, worsening, or persistent severe abdominal pain, headache, bleeding, diarrhea, fever, or any other new or worsening symptoms that concern you.

## 2020-01-26 NOTE — ED Provider Notes (Signed)
[facility] Emergency Department Provider Note ____________________________________________   First MD Initiated Contact with Patient 01/26/20 2126     (approximate)  I have reviewed the triage vital signs and the nursing notes.   HISTORY  Chief Complaint Abdominal Pain    HPI Christy Farmer is a 34 y.o. female G2P2 with PMH as noted below and status post induction of labor with vaginal delivery on 4/3 who presents with left lower quadrant abdominal pain since yesterday, persistent course, described as somewhat crampy, but improved after she took Tylenol today.  She reports associated increased in her bleeding with some relatively large clots yesterday.  She also has had some nonbloody diarrhea, but denies vomiting or fever.  Past Medical History:  Diagnosis Date  . Abnormal uterine bleeding (AUB)   . Allergy    seasonal allergies.  . Asthma   . Constipation   . Enlarged uterus   . Gestational diabetes   . Heavy periods   . Hepatitis A 09/16/2017  . Vaginal Pap smear, abnormal     Patient Active Problem List   Diagnosis Date Noted  . Prolonged premature rupture of membranes 01/17/2020  . Gestational diabetes, diet controlled 11/26/2019  . Viral hepatitis A without hepatic coma 10/18/2017  . Jaundice 09/16/2017  . Annual physical exam 12/01/2015  . Asthma 08/13/2015  . History of abnormal cervical Pap smear 06/15/2015  . Enlarged uterus 06/15/2015  . Menorrhagia 06/15/2015  . Dysmenorrhea 06/15/2015    Past Surgical History:  Procedure Laterality Date  . ANKLE SURGERY    . COLPOSCOPY      Prior to Admission medications   Medication Sig Start Date End Date Taking? Authorizing Provider  acetaminophen (TYLENOL) 500 MG tablet Take 500 mg by mouth every 6 (six) hours as needed.    [provider]  albuterol (PROVENTIL HFA;VENTOLIN HFA) 108 (90 Base) MCG/ACT inhaler Inhale 2 puffs into the lungs every 6 (six) hours as needed for  wheezing or shortness of breath. Patient not taking: Reported on 01/17/2020 01/16/19   Hubbard Hartshorn, FNP  fluticasone [facility]) 50 MCG/ACT nasal spray Place 2 sprays into both nostrils daily. Patient taking differently: Place 2 sprays into both nostrils as needed.  01/16/19   Hubbard Hartshorn, FNP  ibuprofen (ADVIL) 800 MG tablet Take 1 tablet (800 mg total) by mouth every 8 (eight) hours as needed. 01/19/20   Rubie Maid, MD  Prenatal Vit-Fe Fumarate-FA (PRENATAL MULTIVITAMIN) TABS tablet Take 1 tablet by mouth daily at 12 noon.    [provider]    Allergies Patient has no known allergies.  Family History  Problem Relation Age of Onset  . Hyperlipidemia Mother   . Hypertension Mother   . Healthy Father   . Healthy Brother   . Heart attack Maternal Grandmother   . Cancer Neg Hx   . Diabetes Neg Hx   . Heart disease Neg Hx     Social History Social History   Tobacco Use  . Smoking status: Never Smoker  . Smokeless tobacco: Never Used  Substance Use Topics  . Alcohol use: No    Alcohol/week: 2.0 standard drinks    Types: 2 Glasses of wine per week  . Drug use: No    Review of Systems  Constitutional: No fever/chills Eyes: No redness. ENT: No sore throat. Cardiovascular: Denies chest pain. Respiratory: Denies shortness of breath. Gastrointestinal: No vomiting.  Positive for diarrhea. Genitourinary: Negative for dysuria.  Positive for vaginal bleeding. Musculoskeletal:  Negative for back pain. Skin: Negative for rash. Neurological: Negative for headache.   ____________________________________________   PHYSICAL EXAM:  VITAL SIGNS: ED Triage Vitals  Enc Vitals Group     BP 01/26/20 1913 (!) 141/82     Pulse Rate 01/26/20 1913 87     Resp 01/26/20 1913 18     Temp 01/26/20 1913 98.3 F (36.8 C)     Temp Source 01/26/20 1913 Oral     SpO2 01/26/20 1913 99 %     Weight 01/26/20 1920 170 lb (77.1 kg)     Height 01/26/20 1920 5\' 4"  (1.626 m)     Head  Circumference --      Peak Flow --      Pain Score 01/26/20 1920 8     Pain Loc --      Pain Edu? --      Excl. in GC? --     Constitutional: Alert and oriented. Well appearing and in no acute distress. Eyes: Conjunctivae are normal.  Head: Atraumatic. Nose: No congestion/rhinnorhea. Mouth/Throat: Mucous membranes are moist.   Neck: Normal range of motion.  Cardiovascular: Good peripheral circulation. Respiratory: Normal respiratory effort.  No retractions.  Gastrointestinal: Soft with mild left lower quadrant/suprapubic area tenderness.  No distention.  Genitourinary: No flank tenderness. Musculoskeletal: Extremities warm and well perfused.  Neurologic:  Normal speech and language. No gross focal neurologic deficits are appreciated.  Skin:  Skin is warm and dry. No rash noted. Psychiatric: Mood and affect are normal. Speech and behavior are normal.  ____________________________________________   LABS (all labs ordered are listed, but only abnormal results are displayed)  Labs Reviewed  COMPREHENSIVE METABOLIC PANEL - Abnormal; Notable for the following components:      Result Value   Glucose, Bld 142 (*)    Albumin 3.3 (*)    All other components within normal limits  CBC WITH DIFFERENTIAL/PLATELET - Abnormal; Notable for the following components:   Hemoglobin 11.8 (*)    Platelets 436 (*)    All other components within normal limits  TYPE AND SCREEN   ____________________________________________  EKG   ____________________________________________  RADIOLOGY  03/27/20 pelvis: No evidence of retained POC's or other acute findings  ____________________________________________   PROCEDURES  Procedure(s) performed: No  Procedures  Critical Care performed: No ____________________________________________   INITIAL IMPRESSION / ASSESSMENT AND PLAN / ED COURSE  Pertinent labs & imaging results that were available during my care of the patient were reviewed by me  and considered in my medical decision making (see chart for details).  34 year old female G2 P2 9 days postpartum presents with left lower abdominal pain and increase in vaginal bleeding.  I reviewed the past medical records in epic and confirmed the history of prolonged PROM status post induction with vaginal delivery on 4/3.  The patient was discharged on 4/6.  On exam, she is overall well-appearing.  Initial blood pressure was elevated although the patient was having some acute pain at that time.  Her other vital signs are normal.  The abdomen is soft with mild left lower quadrant/left suprapubic area tenderness.  Initial lab work-up obtained from triage is unremarkable.  Ultrasound shows no evidence of retained POC's or other acute abnormalities.  Given the reassuring work-up, overall presentation is consistent with benign postpartum pain/bleeding.  There is no evidence of endometriosis based on the labs and ultrasound findings.  Although the patient does have some diarrhea, she is afebrile with no elevated WBC count or other findings  to suggest colitis, diverticulitis, or other GI etiology.  This also would not be correlated with bleeding.  I consulted Dr. Valentino Saxon from OB/GYN who delivered the patient.  She recommended rechecking the blood pressure to rule out preeclampsia and treating with Methergine.  She does not recommend additional imaging based on the given history.  The patient has no history of preeclampsia or hypertension during the pregnancy.  ----------------------------------------- 10:00 PM on 01/26/2020 -----------------------------------------  Repeat blood pressure is normal.  There is no evidence of preeclampsia.  We will give the Methergine and plan for discharge home.  Return precautions given, and the patient expresses understanding. ____________________________________________   FINAL CLINICAL IMPRESSION(S) / ED DIAGNOSES  Final diagnoses:  Postpartum hemorrhage,  unspecified type      NEW MEDICATIONS STARTED DURING THIS VISIT:  New Prescriptions   No medications on file     Note:  This document was prepared using Dragon voice recognition software and may include unintentional dictation errors.    Dionne Bucy, MD 01/26/20 2310

## 2020-01-26 NOTE — ED Triage Notes (Signed)
Pt presents to ED with severe sharp left lower abd pain that started around 1730. Pt reports having a vaginal delivery on April 4 with no complications. Pt has noticed an increase in bleeding the past 24 hours and reports passing large clots. Denies constipation or urinary retention.

## 2020-01-26 NOTE — ED Notes (Signed)
This RN called pharmacy about missing medication. Pharmacy informed this RN they would bring medication shortly.

## 2020-01-27 ENCOUNTER — Ambulatory Visit (INDEPENDENT_AMBULATORY_CARE_PROVIDER_SITE_OTHER): Payer: Managed Care, Other (non HMO) | Admitting: Obstetrics and Gynecology

## 2020-01-27 ENCOUNTER — Encounter: Payer: Self-pay | Admitting: Obstetrics and Gynecology

## 2020-01-27 DIAGNOSIS — Z0130 Encounter for examination of blood pressure without abnormal findings: Secondary | ICD-10-CM

## 2020-01-27 DIAGNOSIS — R519 Headache, unspecified: Secondary | ICD-10-CM

## 2020-01-27 DIAGNOSIS — R030 Elevated blood-pressure reading, without diagnosis of hypertension: Secondary | ICD-10-CM

## 2020-01-27 NOTE — Progress Notes (Signed)
Pt present for bp check. Pt had elevated bp while in the hospital. Pt's bp in office was 102/68.  Pt c/o headaches x 2 days.

## 2020-02-24 ENCOUNTER — Ambulatory Visit: Payer: Self-pay | Admitting: Podiatry

## 2020-03-02 ENCOUNTER — Encounter: Payer: Self-pay | Admitting: Obstetrics and Gynecology

## 2020-03-02 ENCOUNTER — Ambulatory Visit (INDEPENDENT_AMBULATORY_CARE_PROVIDER_SITE_OTHER): Payer: Managed Care, Other (non HMO) | Admitting: Obstetrics and Gynecology

## 2020-03-02 ENCOUNTER — Other Ambulatory Visit: Payer: Self-pay

## 2020-03-02 DIAGNOSIS — R87619 Unspecified abnormal cytological findings in specimens from cervix uteri: Secondary | ICD-10-CM

## 2020-03-02 DIAGNOSIS — Z8632 Personal history of gestational diabetes: Secondary | ICD-10-CM

## 2020-03-02 DIAGNOSIS — Z3009 Encounter for other general counseling and advice on contraception: Secondary | ICD-10-CM

## 2020-03-02 NOTE — Progress Notes (Signed)
  PT is present today for her postpartum visit. Pt stated that she is breastfeeding and have not had sexually intercourse recently. Pt stated that she would like to get the Mirena for birth control. EPDS=5.   Pt stated that she is doing well no complaints.

## 2020-03-02 NOTE — Progress Notes (Signed)
   OBSTETRICS POSTPARTUM CLINIC PROGRESS NOTE  Subjective:     Christy Farmer is a 34 y.o. G17P2002 female who presents for a postpartum visit. She is 6 weeks postpartum following a spontaneous vaginal delivery. I have fully reviewed the prenatal and intrapartum course, pregnancy was comiplicated by gestational DM (diet controlled). The delivery was at 39 gestational weeks.  Anesthesia: epidural. Postpartum course has been well. Baby's course has been well. Baby is feeding by breast, notes that patient had a h/o thrush from breastfeeding, was treated by the Pediatrician. Bleeding: patient is unsure if she has resumed menses, notes she stopped bleeding for 1-2 weeks, then had an episode of light bleeding x 3 days (started 02/26/2020). Bowel function is normal. Bladder function is normal. Patient is not sexually active. Contraception method desired is IUD. Postpartum depression screening: negative (EPDS = 5).  The following portions of the patient's history were reviewed and updated as appropriate: allergies, current medications, past family history, past medical history, past social history, past surgical history and problem list.  Review of Systems Pertinent items noted in HPI and remainder of comprehensive ROS otherwise negative.   Objective:    BP 109/70   Pulse 72   Ht 5\' 4"  (1.626 m)   Wt 171 lb 4.8 oz (77.7 kg)   Breastfeeding Yes   BMI 29.40 kg/m   General:  alert and no distress   Breasts:  inspection negative, no nipple discharge or bleeding, no masses or nodularity palpable  Lungs: clear to auscultation bilaterally  Heart:  regular rate and rhythm, S1, S2 normal, no murmur, click, rub or gallop  Abdomen: soft, non-tender; bowel sounds normal; no masses,  no organomegaly.     Vulva:  normal  Vagina: normal vagina, no discharge, exudate, lesion, or erythema  Cervix:  no cervical motion tenderness and no lesions  Corpus: normal size, contour, position, consistency, mobility,  non-tender  Adnexa:  normal adnexa and no mass, fullness, tenderness  Rectal Exam: Not performed.         Labs:  Lab Results  Component Value Date   HGB 11.8 (L) 01/26/2020    03/25/2019: Pap smear AGUS  Assessment:   Postpartum care following vaginal delivery Encounter for counseling regarding contraception Atypical glandular cells of undetermined significance (AGUS) on cervical Pap smear History of gestational diabetes  Plan:   1. Contraception: IUD.  Has used Mirena IUD and Paragard IUD in the past.  Notes she like the Mirena but would prefer something with less hormones (concerned about weight and hormonal side effects). Discussed options for lower-dose IUD such as Kyleena. Patient notes interest. Given info.  2. AGUS pap smear noted in June 2020 just prior to pregnancy followed by colposcopy in July 2020 with CIN I noted on biopsy, no ECC performed.  Will repeat pap smear near June, if still abnormal, will need repeat colposcopy and biopsy.  3. H/o GDM, will need to f/u with A1c or 2 hr glucola for postpartum screening.  4. Follow up in: 2 weeks for IUD insertion.  or as needed.   July, MD [facility]

## 2020-03-05 ENCOUNTER — Ambulatory Visit: Payer: Managed Care, Other (non HMO) | Admitting: Podiatry

## 2020-03-05 ENCOUNTER — Other Ambulatory Visit: Payer: Self-pay

## 2020-03-05 DIAGNOSIS — M79674 Pain in right toe(s): Secondary | ICD-10-CM

## 2020-03-05 DIAGNOSIS — B351 Tinea unguium: Secondary | ICD-10-CM | POA: Diagnosis not present

## 2020-03-05 DIAGNOSIS — M659 Synovitis and tenosynovitis, unspecified: Secondary | ICD-10-CM

## 2020-03-05 MED ORDER — TERBINAFINE HCL 250 MG PO TABS: 250.0000 mg | ORAL_TABLET | Freq: Every day | ORAL | 0 refills | Status: DC

## 2020-03-08 NOTE — Progress Notes (Signed)
   Subjective:  34 y.o. female presenting today with a chief complaint of sharp pain to the right great and fifth toenails that began about one year ago. She reports associated yellowish discoloration. Walking increases the pain. She has been trimming the nails and soaking them in Epsom salt for treatment. Patient is here for further evaluation and treatment.   Past Medical History:  Diagnosis Date  . Abnormal uterine bleeding (AUB)   . Allergy    seasonal allergies.  . Asthma   . Constipation   . Enlarged uterus   . Gestational diabetes   . Heavy periods   . Hepatitis A 09/16/2017  . History of gestational diabetes 11/26/2019  . Vaginal Pap smear, abnormal      Objective / Physical Exam:  General:  The patient is alert and oriented x3 in no acute distress. Dermatology:  Hyperkeratotic, discolored, thickened, onychodystrophy noted to nails 1 and 5 of the right foot. Skin is warm, dry and supple bilateral lower extremities. Negative for open lesions or macerations. Vascular:  Palpable pedal pulses bilaterally. No edema or erythema noted. Capillary refill within normal limits. Neurological:  Epicritic and protective threshold grossly intact bilaterally.  Musculoskeletal Exam:  Pain on palpation to the anterior lateral medial aspects of the patient's right ankle. Mild edema noted. Range of motion within normal limits to all pedal and ankle joints bilateral. Muscle strength 5/5 in all groups bilateral.   Assessment: 1. Ankle synovitis right  2. Onychomycosis nails 1 and 5 right   Plan of Care:  1. Patient was evaluated. 2. Injection of 0.5 mL Celestone Soluspan injected in the patient's right ankle. 3. Prescription for Lamisil 250 mg #90 provided to patient.  4. Inquired about liver function and patient declines any known history of issue or problems.  5. Return to clinic in 6 months.    Felecia Shelling, DPM [facility]  Dr. Felecia Shelling, DPM    [address]                                        [place], [place] [address]                Office [phone]  Fax [phone]

## 2020-03-16 ENCOUNTER — Other Ambulatory Visit: Payer: Self-pay

## 2020-03-16 ENCOUNTER — Ambulatory Visit (INDEPENDENT_AMBULATORY_CARE_PROVIDER_SITE_OTHER): Payer: Managed Care, Other (non HMO) | Admitting: Obstetrics and Gynecology

## 2020-03-16 ENCOUNTER — Other Ambulatory Visit (HOSPITAL_COMMUNITY)
Admission: RE | Admit: 2020-03-16 | Discharge: 2020-03-16 | Disposition: A | Discharge status: Home / Self Care | Payer: Managed Care, Other (non HMO) | Source: Ambulatory Visit | Attending: Obstetrics and Gynecology | Admitting: Obstetrics and Gynecology

## 2020-03-16 ENCOUNTER — Encounter: Payer: Self-pay | Admitting: Obstetrics and Gynecology

## 2020-03-16 VITALS — BP 107/67 | HR 76 | Ht 64.0 in | Wt 167.7 lb

## 2020-03-16 DIAGNOSIS — Z30430 Encounter for insertion of intrauterine contraceptive device: Secondary | ICD-10-CM

## 2020-03-16 DIAGNOSIS — R87619 Unspecified abnormal cytological findings in specimens from cervix uteri: Secondary | ICD-10-CM

## 2020-03-16 LAB — POCT URINE PREGNANCY: Preg Test, Ur: NEGATIVE

## 2020-03-16 NOTE — Progress Notes (Signed)
     GYNECOLOGY OFFICE PROCEDURE NOTE  Geana Briget Shaheed is a 34 y.o. [id number] here for [facility] IUD insertion. No GYN concerns.  Last pap smear was on 03/25/2019 and was abnormal, AGUS. Was supposed to also have colposcopy today but was incorrectly scheduled. Will plan to perform endometrial biopsy prior to IUD insertion, then will perform colposcopy at IUD check.     Endometrial Biopsy Procedure Note  The patient is positioned on the exam table in the dorsal lithotomy position. Bimanual exam confirms uterine position and size. A Graves speculum is placed into the vagina. A single toothed tenaculum is placed onto the anterior lip of the cervix. The pipette is placed into the endocervical canal and is advanced to the uterine fundus. Using a piston like technique, with vacuum created by withdrawing the stylus, the endometrial specimen is obtained and transferred to the biopsy container. Minimal bleeding is encountered. The procedure is well tolerated.   Uterine Position: mid    Uterine Length: 8 cm   Uterine Specimen: Average   Post procedure instructions are given. The patient is scheduled for follow up appointment.    IUD Insertion Procedure Note Patient identified, informed consent performed, consent signed.  Discussed risks of irregular bleeding, cramping, infection, malpositioning or misplacement of the IUD outside the uterus which may require further procedure such as laparoscopy. Also discussed >99% contraception efficacy, increased risk of ectopic pregnancy with failure of method.  Time out was performed.  Urine pregnancy test negative.  Speculum previously placed in the vagina and cervix visualized and cleaned for performance of EMB.  Cervix visualized.   Uterus previously sounded to 8 cm.  Kyleena IUD placed per manufacturer's recommendations.  Strings trimmed to 3 cm. Tenaculum was removed, good hemostasis noted.  Patient tolerated procedure well.   Patient was given  post-procedure instructions.  She was advised to have backup contraception for one week.  Patient was also asked to check IUD strings periodically and follow up in 4 weeks for IUD check and colposcopy.   Exp: 06/2021 Lot: [id number]   Hildred Laser, MD [facility]

## 2020-03-16 NOTE — Patient Instructions (Addendum)
Intrauterine Device Insertion, Care After  This sheet gives you information about how to care for yourself after your procedure. Your health care provider may also give you more specific instructions. If you have problems or questions, contact your health care provider. What can I expect after the procedure? After the procedure, it is common to have:  Cramps and pain in the abdomen.  Light bleeding (spotting) or heavier bleeding that is like your menstrual period. This may last for up to a few days.  Lower back pain.  Dizziness.  Headaches.  Nausea. Follow these instructions at home:  Before resuming sexual activity, check to make sure that you can feel the IUD string(s). You should be able to feel the end of the string(s) below the opening of your cervix. If your IUD string is in place, you may resume sexual activity. ? If you had a hormonal IUD inserted more than 7 days after your most recent period started, you will need to use a backup method of birth control for 7 days after IUD insertion. Ask your health care provider whether this applies to you.  Continue to check that the IUD is still in place by feeling for the string(s) after every menstrual period, or once a month.  Take over-the-counter and prescription medicines only as told by your health care provider.  Do not drive or use heavy machinery while taking prescription pain medicine.  Keep all follow-up visits as told by your health care provider. This is important. Contact a health care provider if:  You have bleeding that is heavier or lasts longer than a normal menstrual cycle.  You have a fever.  You have cramps or abdominal pain that get worse or do not get better with medicine.  You develop abdominal pain that is new or is not in the same area of earlier cramping and pain.  You feel lightheaded or weak.  You have abnormal or bad-smelling discharge from your vagina.  You have pain during sexual  activity.  You have any of the following problems with your IUD string(s): ? The string bothers or hurts you or your sexual partner. ? You cannot feel the string. ? The string has gotten longer.  You can feel the IUD in your vagina.  You think you may be pregnant, or you miss your menstrual period.  You think you may have an STI (sexually transmitted infection). Get help right away if:  You have flu-like symptoms.  You have a fever and chills.  You can feel that your IUD has slipped out of place. Summary  After the procedure, it is common to have cramps and pain in the abdomen. It is also common to have light bleeding (spotting) or heavier bleeding that is like your menstrual period.  Continue to check that the IUD is still in place by feeling for the string(s) after every menstrual period, or once a month.  Keep all follow-up visits as told by your health care provider. This is important.  Contact your health care provider if you have problems with your IUD string(s), such as the string getting longer or bothering you or your sexual partner. This information is not intended to replace advice given to you by your health care provider. Make sure you discuss any questions you have with your health care provider. Document Revised: 09/14/2017 Document Reviewed: 08/23/2016 Elsevier Patient Education  2020 Else[employer] Inc[employer]    Colposcopy Colposcopy is a procedure to examine the lowest part of the uterus (cervix),  the vagina, and the area around the vaginal opening (vulva) for abnormalities or signs of disease. The procedure is done using a lighted microscope or magnifying lens (colposcope). If any unusual cells are found during the procedure, your health care provider may remove a tissue sample for testing (biopsy). A colposcopy may be done if you:  Have an abnormal Pap test. A Pap test is a screening test that is used to check for signs of cancer or infection of the vagina, cervix, and  uterus.  Have a Pap smear test in which you test positive for high-risk HPV (human papillomavirus).  Have a sore or lesion on your cervix.  Have genital warts on your vulva, vagina, or cervix.  Took certain medicines while pregnant, such as diethylstilbestrol (DES).  Have pain during sexual intercourse.  Have vaginal bleeding, especially after sexual intercourse.  Need to have a cervical polyp removed.  Need to have a lost intrauterine device (IUD) string located. Let your health care provider know about:  Any allergies you have, including allergies to prescribed medicine, latex, or iodine.  All medicines you are taking, including vitamins, herbs, eye drops, creams, and over-the-counter medicines. Bring a list of all of your medicines to your appointment.  Any problems you or family members have had with anesthetic medicines.  Any blood disorders you have.  Any surgeries you have had.  Any medical conditions you have, such as pelvic inflammatory disease (PID) or endometrial disorder.  Any history of frequent fainting.  Your menstrual cycle and what form of birth control (contraception) you use.  Your medical history, including any prior cervical treatment.  Whether you are pregnant or may be pregnant. What are the risks? Generally, this is a safe procedure. However, problems may occur, including:  Pain.  Infection, which may include a fever, bad-smelling discharge, or pelvic pain.  Bleeding or discharge.  Misdiagnosis.  Fainting and vasovagal reactions, but this is rare.  Allergic reactions to medicines.  Damage to other structures or organs. What happens before the procedure?  If you have your menstrual period or will have it at the time of your procedure, tell your health care provider. A colposcopy typically is not done during menstruation.  Continue your contraceptive practices before and after the procedure.  For 24 hours before the colposcopy: ? Do  not douche. ? Do not use tampons. ? Do not use medicines, creams, or suppositories in the vagina. ? Do not have sexual intercourse.  Ask your health care provider about: ? Changing or stopping your regular medicines. This is especially important if you are taking diabetes medicines or blood thinners. ? Taking medicines such as aspirin and ibuprofen. These medicines can thin your blood. Do not take these medicines before your procedure if your health care provider instructs you not to. It is likely that your health care provider will tell you to avoid taking aspirin or medicine that contains aspirin for 7 days before the procedure.  Follow instructions from your health care provider about eating or drinking restrictions. You will likely need to eat a regular diet the day of the procedure and not skip any meals.  You may have an exam or testing. A pregnancy test will be taken on the day of the procedure.  You may have a blood or urine sample taken.  Plan to have someone take you home from the hospital or clinic.  If you will be going home right after the procedure, plan to have someone with you for 24  hours. What happens during the procedure?  You will lie down on your back, with your feet in foot rests (stirrups).  A warmed and lubricated instrument (speculum) will be inserted into your vagina. The speculum will be used to hold apart the walls of your vagina so your health care provider can see your cervix and the inside of your vagina.  A cotton swab will be used to place a small amount of liquid solution on the areas to be examined. This solution makes it easier to see abnormal cells. You may feel a slight burning during this part.  The colposcope will be used to scan the cervix with a bright white light. The colposcope will be held near your vulvaand will magnify your vulva, vagina, and cervix for easier examination.  Your health care provider may decide to take a biopsy. If so: ? You  may be given medicine to numb the area (local anesthetic). ? Surgical instruments will be used to suck out mucus and cells through your vagina. ? You may feel mild pain while the tissue sample is removed. ? Bleeding may occur. A solution may be used to stop the bleeding. ? If a sample of tissue is needed from the inside of the cervix, a different procedure called endocervical curettage (ECC) may be completed. During this procedure, a curved instrument (curette) will be used to scrape cells from your cervix or the top of your cervix (endocervix).  Your health care provider will record the location of any abnormalities. The procedure may vary among health care providers and hospitals. What happens after the procedure?  You will lie down and rest for a few minutes. You may be offered juice or cookies.  Your blood pressure, heart rate, breathing rate, and blood oxygen level will be monitored until any medicines you were given have worn off.  You may have to wear compression stockings. These stockings help to prevent blood clots and reduce swelling in your legs.  You may have some cramping in your abdomen. This should go away after a few minutes. This information is not intended to replace advice given to you by your health care provider. Make sure you discuss any questions you have with your health care provider. Document Revised: 09/14/2017 Document Reviewed: 05/08/2016 Elsevier Patient Education  2020 [employer].

## 2020-03-16 NOTE — Progress Notes (Signed)
Pt present for IUD insertion. Pt stated that she thought she was getting her colpo before doing her IUD. UPT-neg.

## 2020-03-18 LAB — SURGICAL PATHOLOGY

## 2020-04-02 ENCOUNTER — Ambulatory Visit: Payer: Managed Care, Other (non HMO) | Admitting: Obstetrics and Gynecology

## 2020-04-02 ENCOUNTER — Encounter: Payer: Self-pay | Admitting: Obstetrics and Gynecology

## 2020-04-02 ENCOUNTER — Other Ambulatory Visit (HOSPITAL_COMMUNITY)
Admission: RE | Admit: 2020-04-02 | Discharge: 2020-04-02 | Disposition: A | Discharge status: Home / Self Care | Payer: Managed Care, Other (non HMO) | Source: Ambulatory Visit | Attending: Obstetrics and Gynecology | Admitting: Obstetrics and Gynecology

## 2020-04-02 ENCOUNTER — Other Ambulatory Visit: Payer: Self-pay

## 2020-04-02 VITALS — BP 111/76 | HR 74 | Ht 64.0 in | Wt 169.6 lb

## 2020-04-02 DIAGNOSIS — Z30431 Encounter for routine checking of intrauterine contraceptive device: Secondary | ICD-10-CM

## 2020-04-02 DIAGNOSIS — R87619 Unspecified abnormal cytological findings in specimens from cervix uteri: Secondary | ICD-10-CM

## 2020-04-02 NOTE — Patient Instructions (Signed)

## 2020-04-02 NOTE — Addendum Note (Signed)
Addended by: Silvano Bilis on: 04/02/2020 10:20 AM   Modules accepted: Orders

## 2020-04-02 NOTE — Progress Notes (Signed)
    GYNECOLOGY OFFICE ENCOUNTER NOTE  History:  34 y.o. [id number] here today for today for IUD string check; Kyleena  IUD was placed. No complaints about the IUD, no concerning side effects except does note some mild pelvic discomfort on left side intermittently.  She notes that she did have unprotected intercourse 2-3 days after insertion, used withdrawal method. Patient's last menstrual period was 03/30/2020.  She also presents today for a colposcopy.  She has a h/o AGUS pap smear smear in 03/2019 during her pregnancy. Endometrial biopsy was performed at time of IUD insertion.  For colposcopy and repeat pap smear today.  The following portions of the patient's history were reviewed and updated as appropriate: allergies, current medications, past family history, past medical history, past social history, past surgical history and problem list.   Review of Systems:  Pertinent items are noted in HPI.  Objective:  Physical Exam Blood pressure 111/76, pulse 74, height 5\' 4"  (1.626 m), weight 169 lb 9.6 oz (76.9 kg), last menstrual period 03/30/2020, currently breastfeeding. CONSTITUTIONAL: Well-developed, well-nourished female in no acute distress.  HENT:  Normocephalic, atraumatic. External right and left ear normal. Oropharynx is clear and moist ABDOMEN: Soft, no distention noted.   PELVIC: Normal appearing external genitalia; normal appearing vaginal mucosa and cervix.  IUD strings visualized, about 3 cm in length outside cervix.  EXTREMITIES: extremities normal, atraumatic, no cyanosis or edema NEUROLOGIC: Grossly normal  Assessment & Plan:  - Patient to keep IUD in place for up to five years; can come in for removal if she desires pregnancy earlier or for any concerning side effects. - Colposcopy performed today, see procedure note below.        GYNECOLOGY CLINIC COLPOSCOPY PROCEDURE NOTE  34 y.o. 20 here for colposcopy for glandular cell abnormality (AGUS) pap smear on  03/2019. Discussed role for HPV in cervical dysplasia, need for surveillance.  Had endometrial biopsy performed 02/2020 and was benign.  Patient given informed consent, signed copy in the chart, time out was performed.  Placed in lithotomy position. Cervix viewed with speculum and colposcope after application of acetic acid.   Colposcopy adequate? Yes  no mosaicism, no punctation, no abnormal vasculature and mild HPV changes noted at 7 o'clock,  IUD threads visible, 3 cm in length.; corresponding biopsies obtained. ECC specimen obtained. All specimens were labeled and sent to pathology.  Patient was given post procedure instructions.  Will follow up pathology and manage accordingly; patient will be contacted with results and recommendations.  Routine preventative health maintenance measures emphasized.    03/2020, MD [facility]

## 2020-04-02 NOTE — Progress Notes (Signed)
Pt present for IUD string check. Pt's LMP 03/30/2020. Pt stated having left abd pain. Pt denies any issues with the IUD at this time.

## 2020-04-05 LAB — SURGICAL PATHOLOGY

## 2020-04-08 LAB — CYTOLOGY - PAP
Comment: NEGATIVE
Comment: NEGATIVE
Diagnosis: NEGATIVE
HPV 16: NEGATIVE
HPV 18 / 45: NEGATIVE
High risk HPV: POSITIVE — AB

## 2020-04-13 ENCOUNTER — Ambulatory Visit: Payer: Self-pay | Admitting: Obstetrics and Gynecology

## 2020-08-03 ENCOUNTER — Encounter: Payer: Managed Care, Other (non HMO) | Admitting: Obstetrics and Gynecology

## 2020-08-31 ENCOUNTER — Encounter: Payer: Managed Care, Other (non HMO) | Admitting: Obstetrics and Gynecology

## 2020-09-07 ENCOUNTER — Encounter: Payer: Self-pay | Admitting: Obstetrics and Gynecology

## 2020-12-12 IMAGING — US US PELVIS LIMITED
1 series · 14 of 25 positions shown · non-contrast
Comparison: None.

CLINICAL DATA: Abdominal pain, bleeding 1 week postpartum

EXAM:
TRANSABDOMINAL ULTRASOUND OF PELVIS
TECHNIQUE: Transabdominal ultrasound examination of the pelvis was performed
including evaluation of the uterus, ovaries, adnexal regions, and
pelvic cul-de-sac.

[Series 1: us pelvis limited (transabdominal only) · 46 acquisitions, 14 frames shown]
[im 1/46]
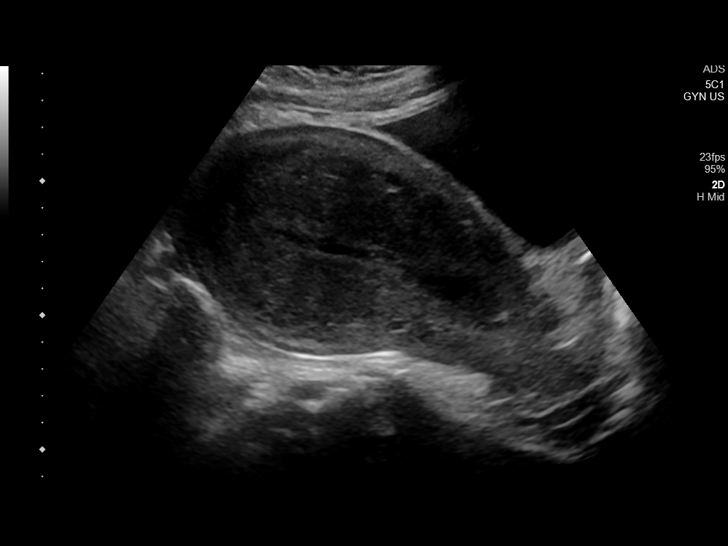
[im 4/46]
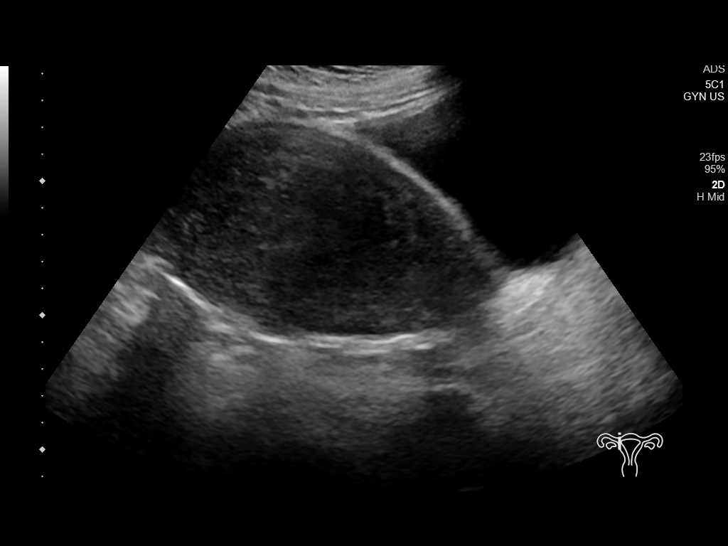
[im 8/46]
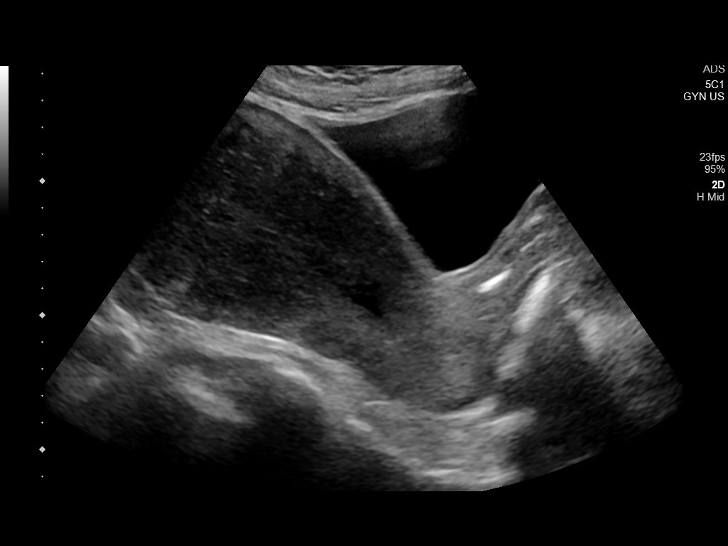
[im 12/46]
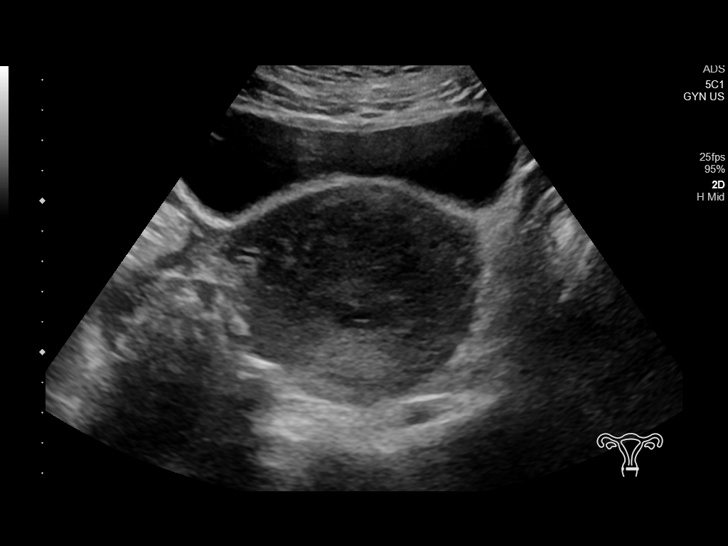
[im 16/46]
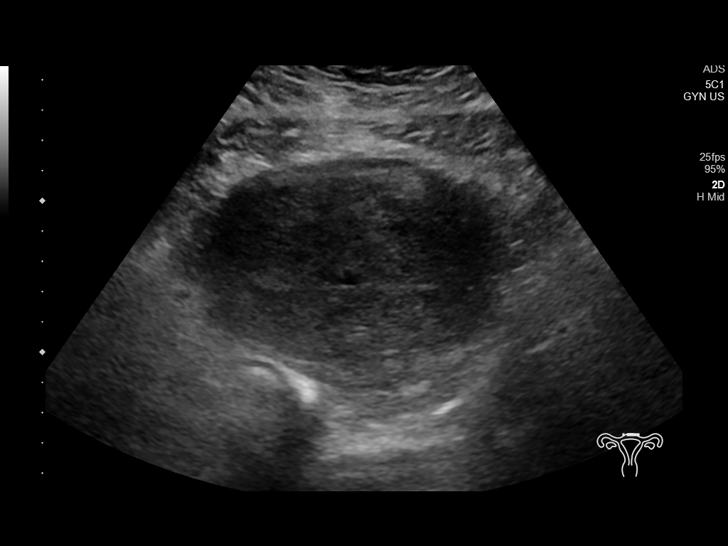
[im 17/46]
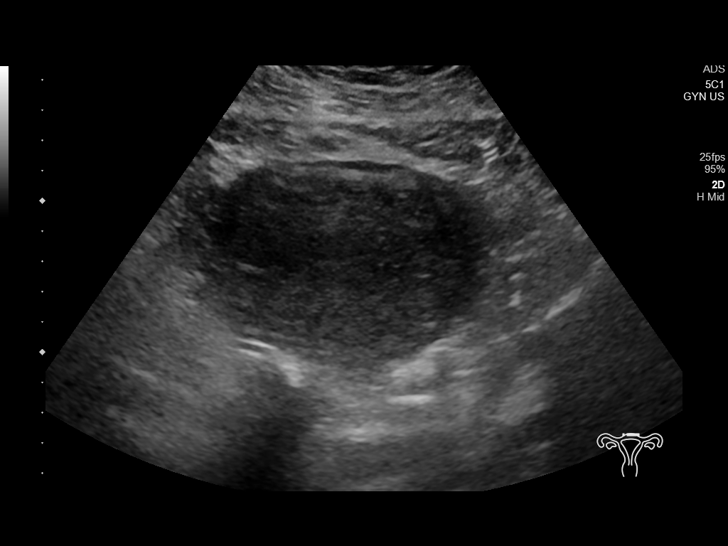
[im 21/46]
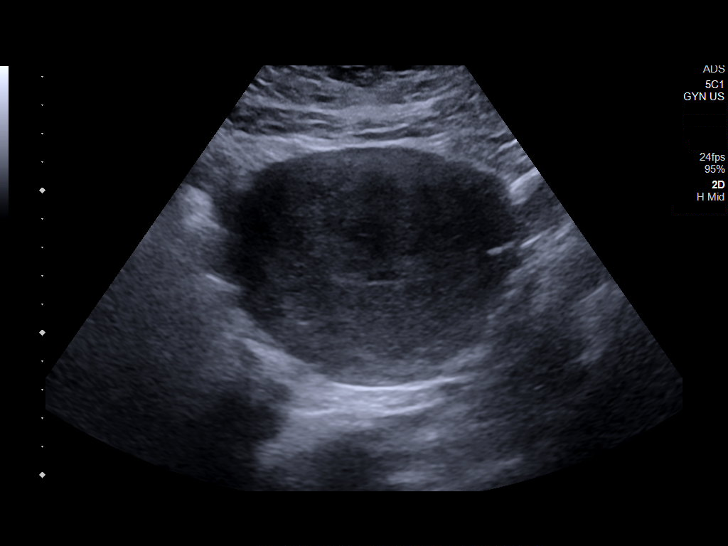
[im 25/46]
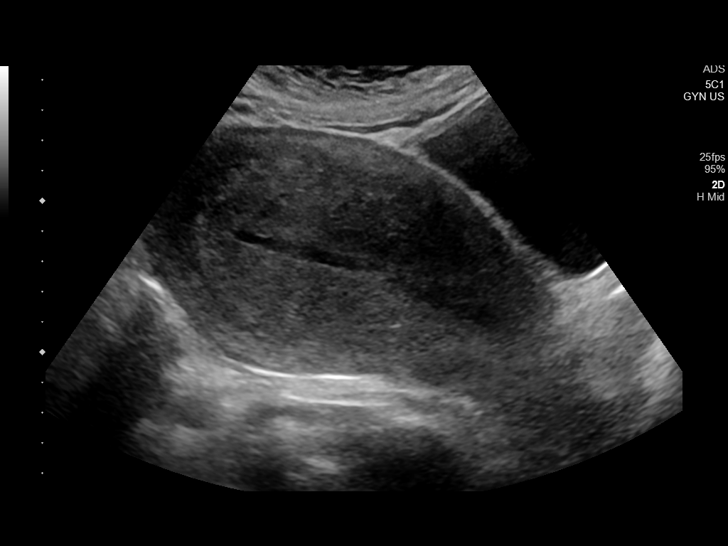
[im 29/46]
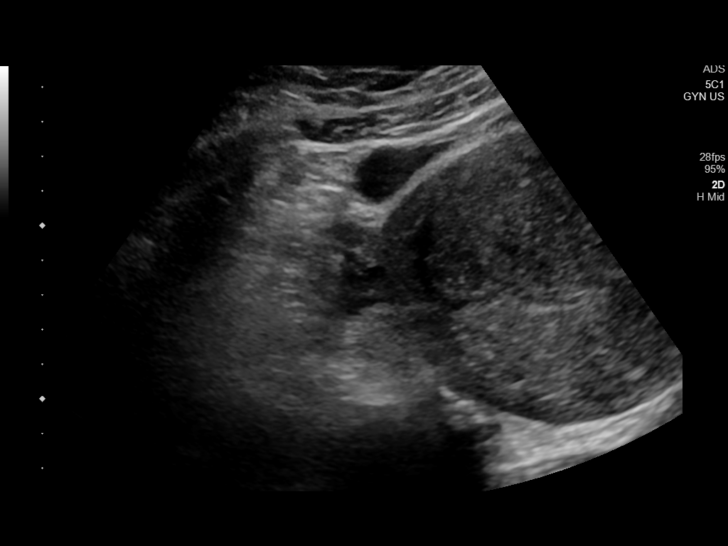
[im 31/46]
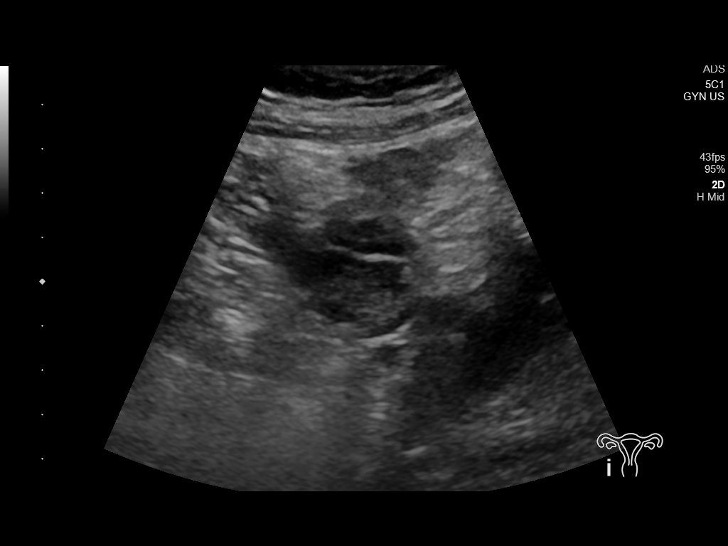
[im 34/46]
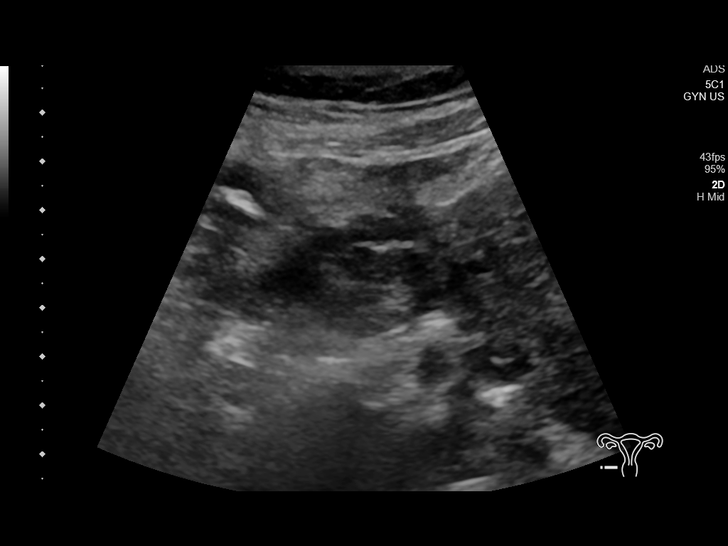
[im 38/46]
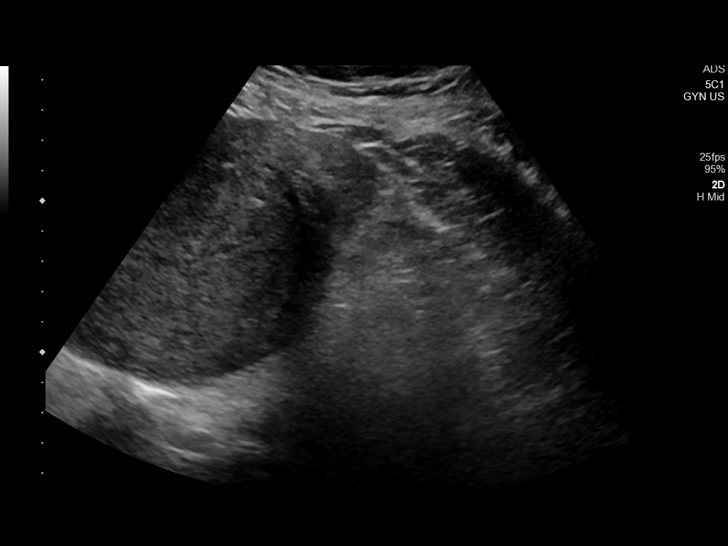
[im 42/46]
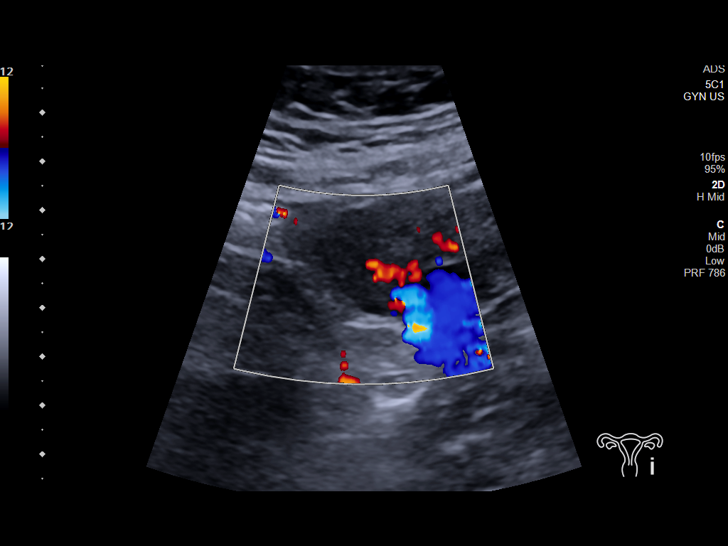
[im 46/46]
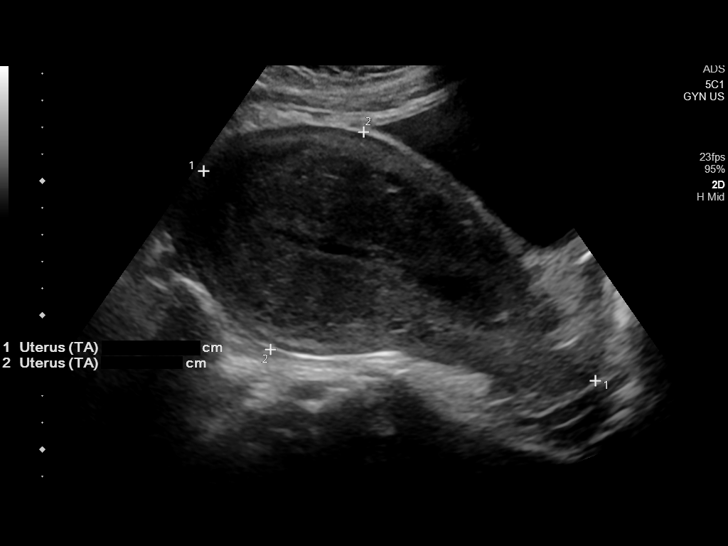

[14 of 25 positions shown; findings below may reference images not displayed]

FINDINGS: Uterus

Measurements: 16.6 x 11.0 by 8.8 cm = volume: 836 mL. No fibroids or
other mass visualized.

Endometrium

Thickness: 3 mm. Trace fluid within the endometrial cavity. No
retained products of conception.

Right ovary

Measurements: 2.6 x 1.7 x 2.9 cm = volume: 6.7 mL. Normal
appearance/no adnexal mass.

Left ovary

Measurements: 3.2 x 2.1 by 2.0 cm = volume: 7.0 mL. Normal
appearance/no adnexal mass.

Other findings:  No abnormal free fluid.
IMPRESSION: 1. Trace fluid within the endometrial cavity. No evidence of
retained products of conception.
2. Enlarged uterus consistent with recent postpartum state.
3. Otherwise unremarkable exam.

## 2021-01-07 ENCOUNTER — Ambulatory Visit (INDEPENDENT_AMBULATORY_CARE_PROVIDER_SITE_OTHER): Payer: Managed Care, Other (non HMO) | Admitting: Obstetrics and Gynecology

## 2021-01-07 ENCOUNTER — Encounter: Payer: Self-pay | Admitting: Obstetrics and Gynecology

## 2021-01-07 ENCOUNTER — Other Ambulatory Visit: Payer: Self-pay

## 2021-01-07 VITALS — BP 113/71 | HR 76 | Ht 64.0 in | Wt 169.9 lb

## 2021-01-07 DIAGNOSIS — Z131 Encounter for screening for diabetes mellitus: Secondary | ICD-10-CM | POA: Diagnosis not present

## 2021-01-07 DIAGNOSIS — Z13220 Encounter for screening for lipoid disorders: Secondary | ICD-10-CM

## 2021-01-07 DIAGNOSIS — Z01419 Encounter for gynecological examination (general) (routine) without abnormal findings: Secondary | ICD-10-CM

## 2021-01-07 DIAGNOSIS — N72 Inflammatory disease of cervix uteri: Secondary | ICD-10-CM | POA: Diagnosis not present

## 2021-01-07 DIAGNOSIS — R519 Headache, unspecified: Secondary | ICD-10-CM

## 2021-01-07 DIAGNOSIS — E663 Overweight: Secondary | ICD-10-CM

## 2021-01-07 DIAGNOSIS — B977 Papillomavirus as the cause of diseases classified elsewhere: Secondary | ICD-10-CM

## 2021-01-07 NOTE — Progress Notes (Signed)
GYNECOLOGY ANNUAL PHYSICAL EXAM PROGRESS NOTE  Subjective:    Christy Farmer is a 35 y.o. G2P2002 married emale who presents for an annual exam.  The patient is sexually active.  The patient participates in regular exercise: yes (walking on treadmill 30 min day, 4 days per week). Has the patient ever been transfused or tattooed?: no. The patient reports that there is not domestic violence in her life.   The patient has the following complaints today:  1. Reports weight gain despite exercising. Difficulty controlling weight due to appetite. Has tried OTC Hydroxycut and Garcinia [place] without any benefits. Has also tried Alli but notes GI upset so discontinued.  2. Reports increase in headaches lately. No noise or light sensitivities. Ibuprofen does not help much. Has had recent eye exam. Does report that she has been grinding her teeth lately. Mouth is more sensitive. Just purchased a mouth guard yesterday.   Menstrual History: Menarche age: 87 or 56 No LMP recorded. (Menstrual status: IUD).    Gynecologic History Contraception: Rutha Bouchard IUD, placed 03/2020. History of STI's: Denies Last Pap: 04/02/2020.  Results were: NILM, HR HPV+ (neg types 16/18). Had colposcopy 03/2020, with biopsies noting CIN I.  Previous history of abnormal pap smear on 03/25/2019, AGUS, biopsy deferred due to pregnancy. Had EMB 03/2020 which was negative.      Upstream - 01/07/21 1110      Pregnancy Intention Screening   Does the patient want to become pregnant in the next year? No    Does the patient's partner want to become pregnant in the next year? No    Would the patient like to discuss contraceptive options today? No      Contraception Wrap Up   Current Method IUD or IUS          The pregnancy intention screening data noted above was reviewed. Potential methods of contraception were not discussed. The patient elected to continue with IUD or IUS.   OB History  Gravida Para Term Preterm AB  Living  2 2 2  0 0 2  SAB IAB Ectopic Multiple Live Births  0 0 0 0 2    # Outcome Date GA Lbr Len/2nd Weight Sex Delivery Anes PTL Lv  2 Term 01/18/20 [redacted]w[redacted]d 00:21 / 00:24 6 lb 13.2 oz (3.095 kg) F Vag-Spont None  LIV     Name: MONESHA, MONREAL     Apgar1: 8  Apgar5: 9  1 Term 06/14/03   6 lb 9 oz (2.977 kg) M   N LIV    Past Medical History:  Diagnosis Date  . Abnormal uterine bleeding (AUB)   . Allergy    seasonal allergies.  . Asthma   . Constipation   . Enlarged uterus   . Gestational diabetes   . Heavy periods   . Hepatitis A 09/16/2017  . History of gestational diabetes 11/26/2019  . Vaginal Pap smear, abnormal     Past Surgical History:  Procedure Laterality Date  . ANKLE SURGERY    . COLPOSCOPY      Family History  Problem Relation Age of Onset  . Hyperlipidemia Mother   . Hypertension Mother   . Healthy Father   . Healthy Brother   . Heart attack Maternal Grandmother   . Cancer Neg Hx   . Diabetes Neg Hx   . Heart disease Neg Hx     Social History   Socioeconomic History  . Marital status: Single    Spouse name:  Not on file  . Number of children: Not on file  . Years of education: Not on file  . Highest education level: Not on file  Occupational History  . Not on file  Tobacco Use  . Smoking status: Never Smoker  . Smokeless tobacco: Never Used  Vaping Use  . Vaping Use: Never used  Substance and Sexual Activity  . Alcohol use: Yes    Alcohol/week: 2.0 standard drinks    Types: 2 Glasses of wine per week  . Drug use: No  . Sexual activity: Yes    Birth control/protection: None, I.U.D.    Comment: IUD inserted 03/16/2020  Other Topics Concern  . Not on file  Social History Narrative  . Not on file   Social Determinants of Health   Financial Resource Strain: Not on file  Food Insecurity: Not on file  Transportation Needs: Not on file  Physical Activity: Not on file  Stress: Not on file  Social Connections: Not on file   Intimate Partner Violence: Not on file    Current Outpatient Medications on File Prior to Visit  Medication Sig Dispense Refill  . acetaminophen (TYLENOL) 500 MG tablet Take 500 mg by mouth every 6 (six) hours as needed.    Marland Kitchen albuterol (PROVENTIL HFA;VENTOLIN HFA) 108 (90 Base) MCG/ACT inhaler Inhale 2 puffs into the lungs every 6 (six) hours as needed for wheezing or shortness of breath. 1 Inhaler 0  . fluticasone (FLONASE) 50 MCG/ACT nasal spray Place 2 sprays into both nostrils daily. (Patient taking differently: Place 2 sprays into both nostrils as needed.) 16 g 6  . ibuprofen (ADVIL) 800 MG tablet Take 1 tablet (800 mg total) by mouth every 8 (eight) hours as needed. 60 tablet 1  . levonorgestrel (KYLEENA) 19.5 MG IUD by Intrauterine route once. Insertion date 03/16/2020    . Prenatal Vit-Fe Fumarate-FA (PRENATAL MULTIVITAMIN) TABS tablet Take 1 tablet by mouth daily at 12 noon. (Patient not taking: Reported on 01/07/2021)     No current facility-administered medications on file prior to visit.    No Known Allergies    Review of Systems Constitutional: negative for chills, fatigue, fevers and sweats. Positive for weight gain. Eyes: negative for irritation, redness and visual disturbance Ears, nose, mouth, throat, and face: negative for hearing loss, nasal congestion, snoring and tinnitus Respiratory: negative for asthma, cough, sputum Cardiovascular: negative for chest pain, dyspnea, exertional chest pressure/discomfort, irregular heart beat, palpitations and syncope Gastrointestinal: negative for abdominal pain, change in bowel habits, nausea and vomiting Genitourinary: negative for abnormal menstrual periods, genital lesions, sexual problems and vaginal discharge, dysuria and urinary incontinence Integument/breast: negative for breast lump, breast tenderness and nipple discharge Hematologic/lymphatic: negative for bleeding and easy bruising Musculoskeletal:negative for back pain  and muscle weakness Neurological: negative for dizziness, vertigo and weakness. Positive for headaches Endocrine: negative for diabetic symptoms including polydipsia, polyuria and skin dryness Allergic/Immunologic: negative for hay fever and urticaria        Objective:  Blood pressure 113/71, pulse 76, height 5\' 4"  (1.626 m), weight 169 lb 14.4 oz (77.1 kg), currently breastfeeding. Body mass index is 29.16 kg/m.  General Appearance:    Alert, cooperative, no distress, appears stated age, overweight  Head:    Normocephalic, without obvious abnormality, atraumatic  Eyes:    PERRL, conjunctiva/corneas clear, EOM's intact, both eyes  Ears:    Normal external ear canals, both ears  Nose:   Nares normal, septum midline, mucosa normal, no drainage or sinus tenderness  Throat:   Lips, mucosa, and tongue normal; teeth and gums normal  Neck:   Supple, symmetrical, trachea midline, no adenopathy; thyroid: no enlargement/tenderness/nodules; no carotid bruit or JVD  Back:     Symmetric, no curvature, ROM normal, no CVA tenderness  Lungs:     Clear to auscultation bilaterally, respirations unlabored  Chest Wall:    No tenderness or deformity   Heart:    Regular rate and rhythm, S1 and S2 normal, no murmur, rub or gallop  Breast Exam:    No tenderness, masses, or nipple abnormality  Abdomen:     Soft, non-tender, bowel sounds active all four quadrants, no masses, no organomegaly.    Genitalia:    Pelvic:external genitalia normal, vagina without lesions, discharge, or tenderness, rectovaginal septum  normal. Cervix normal in appearance, no cervical motion tenderness, IUD threads visible, ~ 3 cm in length. No adnexal masses or tenderness.  Uterus normal size, shape, mobile, regular contours, nontender.  Rectal:    Normal external sphincter.  No hemorrhoids appreciated. Internal exam not done.   Extremities:   Extremities normal, atraumatic, no cyanosis or edema  Pulses:   2+ and symmetric all extremities   Skin:   Skin color, texture, turgor normal, no rashes or lesions  Lymph nodes:   Cervical, supraclavicular, and axillary nodes normal  Neurologic:   CNII-XII intact, normal strength, sensation and reflexes throughout   .  Labs:  Lab Results  Component Value Date   WBC 9.5 01/26/2020   HGB 11.8 (L) 01/26/2020   HCT 36.6 01/26/2020   MCV 83.4 01/26/2020   PLT 436 (H) 01/26/2020    Lab Results  Component Value Date   CREATININE 0.54 01/26/2020   BUN 12 01/26/2020   NA 138 01/26/2020   K 3.5 01/26/2020   CL 106 01/26/2020   CO2 23 01/26/2020    Lab Results  Component Value Date   ALT 22 01/26/2020   AST 22 01/26/2020   ALKPHOS 113 01/26/2020   BILITOT 0.5 01/26/2020    Lab Results  Component Value Date   TSH 3.560 03/26/2019     Assessment:   1. Encounter for well woman exam with routine gynecological exam   2. High risk human papilloma virus (HPV) infection of cervix   3. Screening for diabetes mellitus   4. Screening for lipoid disorders   5. Overweight (BMI 25.0-29.9)   6. Nonintractable episodic headache, unspecified headache type     Plan:    - Blood tests: CBC with diff, Comprehensive metabolic panel, Lipoproteins, TSH and HgbA1c. - Breast self exam technique reviewed and patient encouraged to perform self-exam monthly. - Contraception: IUD. Due for removal in 4 years.  - Discussed healthy lifestyle modifications. Recommend other OTC supplements to aid with appetite suppresion, including apple cider vinegar ([place]) gummies.  - Pap smear up to date.  NILM with HR HPV+ (neg types 16/18) with prior AGUS pap in pregnancy. Will repeat in 1 year. Had colposcopic biopsies with CIN I.  - COVID vaccination status: has completed vaccination series.  Eligible for booster.  - Flu vaccine: declined.  - Headaches - could be secondary to teeth grinding, stress, or other new onset. No evidence of migraines. Ibuprofen not helpful. Advised on Excedrin for headaches. Also  can try occasional caffiene. If headaches persist, advised to return for further evaluation and possible referral to Neurology.     Hildred Laser, MD [facility]

## 2021-01-07 NOTE — Patient Instructions (Addendum)

## 2021-01-07 NOTE — Progress Notes (Signed)
Pt present for annual exam. Pt stated that she was doing well and denies any gyn issues at this current time.

## 2021-01-08 ENCOUNTER — Encounter: Payer: Self-pay | Admitting: Obstetrics and Gynecology

## 2021-01-08 LAB — LIPID PANEL
Chol/HDL Ratio: 4.8 ratio — ABNORMAL HIGH (ref 0.0–4.4)
Cholesterol, Total: 237 mg/dL — ABNORMAL HIGH (ref 100–199)
HDL: 49 mg/dL (ref >39)
LDL Chol Calc (NIH): 166 mg/dL — ABNORMAL HIGH (ref 0–99)
Triglycerides: 120 mg/dL (ref 0–149)
VLDL Cholesterol Cal: 22 mg/dL (ref 5–40)

## 2021-01-08 LAB — COMPREHENSIVE METABOLIC PANEL
ALT: 13 IU/L (ref 0–32)
AST: 16 IU/L (ref 0–40)
Albumin/Globulin Ratio: 1.7 (ref 1.2–2.2)
Albumin: 4.8 g/dL (ref 3.8–4.8)
Alkaline Phosphatase: 58 IU/L (ref 44–121)
BUN/Creatinine Ratio: 14 (ref 9–23)
BUN: 9 mg/dL (ref 6–20)
Bilirubin Total: 0.5 mg/dL (ref 0.0–1.2)
CO2: 22 mmol/L (ref 20–29)
Calcium: 9.8 mg/dL (ref 8.7–10.2)
Chloride: 100 mmol/L (ref 96–106)
Creatinine, Ser: 0.65 mg/dL (ref 0.57–1.00)
Globulin, Total: 2.9 g/dL (ref 1.5–4.5)
Glucose: 90 mg/dL (ref 65–99)
Potassium: 4.3 mmol/L (ref 3.5–5.2)
Sodium: 139 mmol/L (ref 134–144)
Total Protein: 7.7 g/dL (ref 6.0–8.5)
eGFR: 118 mL/min/{1.73_m2} (ref >59)

## 2021-01-08 LAB — CBC
Hematocrit: 41 % (ref 34.0–46.6)
Hemoglobin: 13.7 g/dL (ref 11.1–15.9)
MCH: 28.9 pg (ref 26.6–33.0)
MCHC: 33.4 g/dL (ref 31.5–35.7)
MCV: 87 fL (ref 79–97)
Platelets: 356 10*3/uL (ref 150–450)
RBC: 4.74 x10E6/uL (ref 3.77–5.28)
RDW: 13.1 % (ref 11.7–15.4)
WBC: 6.8 10*3/uL (ref 3.4–10.8)

## 2021-01-08 LAB — TSH: TSH: 1.4 u[IU]/mL (ref 0.450–4.500)

## 2022-01-10 HISTORY — PX: LIPOSUCTION: SHX10

## 2022-01-13 ENCOUNTER — Encounter: Payer: Managed Care, Other (non HMO) | Admitting: Certified Nurse Midwife

## 2022-02-14 ENCOUNTER — Encounter (INDEPENDENT_AMBULATORY_CARE_PROVIDER_SITE_OTHER): Payer: Self-pay

## 2022-02-16 ENCOUNTER — Encounter: Payer: Self-pay | Admitting: Family Medicine

## 2022-02-16 ENCOUNTER — Ambulatory Visit (INDEPENDENT_AMBULATORY_CARE_PROVIDER_SITE_OTHER): Payer: Managed Care, Other (non HMO) | Admitting: Family Medicine

## 2022-02-16 VITALS — BP 110/84 | HR 69 | Ht 64.0 in | Wt 158.0 lb

## 2022-02-16 DIAGNOSIS — Z0000 Encounter for general adult medical examination without abnormal findings: Secondary | ICD-10-CM | POA: Diagnosis not present

## 2022-02-16 DIAGNOSIS — Z13220 Encounter for screening for lipoid disorders: Secondary | ICD-10-CM | POA: Diagnosis not present

## 2022-02-16 DIAGNOSIS — M1990 Unspecified osteoarthritis, unspecified site: Secondary | ICD-10-CM | POA: Insufficient documentation

## 2022-02-16 DIAGNOSIS — R7989 Other specified abnormal findings of blood chemistry: Secondary | ICD-10-CM | POA: Diagnosis not present

## 2022-02-16 DIAGNOSIS — M7591 Shoulder lesion, unspecified, right shoulder: Secondary | ICD-10-CM

## 2022-02-16 DIAGNOSIS — J4520 Mild intermittent asthma, uncomplicated: Secondary | ICD-10-CM

## 2022-02-16 DIAGNOSIS — M19271 Secondary osteoarthritis, right ankle and foot: Secondary | ICD-10-CM | POA: Diagnosis not present

## 2022-02-16 DIAGNOSIS — E782 Mixed hyperlipidemia: Secondary | ICD-10-CM

## 2022-02-16 MED ORDER — ALBUTEROL SULFATE HFA 108 (90 BASE) MCG/ACT IN AERS: 2.0000 | INHALATION_SPRAY | Freq: Four times a day (QID) | RESPIRATORY_TRACT | 2 refills | Status: DC | PRN

## 2022-02-16 NOTE — Assessment & Plan Note (Signed)
Currently well controlled, did not require her as needed albuterol inhaler over the last month, will follow this issue as needed. ?

## 2022-02-16 NOTE — Assessment & Plan Note (Addendum)
Incidentally noted during today's examination, focality to the right supraspinatus without weakness.  This is in the setting of the patient being involved in a recent work-out program.  I have advised scheduled meloxicam x2 weeks then return to her previous as needed dosing, start of a home based rehab program with [facility] shoulder conditioning program after meloxicam course, and for recalcitrant symptomatology she can schedule a separate follow-up. ?

## 2022-02-16 NOTE — Assessment & Plan Note (Signed)
Patient has a history of right chronic ankle pain in the setting of multiple corticosteroid injections, arthroscopic surgery unspecified through podiatry group, and due to recalcitrant symptomatology was later evaluated by orthopedic foot and ankle group with MRI revealing bony necrosis.  She was offered ankle fusion surgery but, given her young age, level of activity, this was deferred.  She continues to note pain utilizing meloxicam on a as needed basis to control symptoms.  We did discuss various pharmacologic and nonpharmacologic treatment strategies inclusive of physical therapy, bracing, viscosupplementation, and platelet rich plasma.  She is also considering additional ankle surgeries and does have upcoming follow-up with orthopedic foot and ankle group which I have encouraged her to maintain.  She will contact us if interested in pursuing any nonsurgical option. ?

## 2022-02-16 NOTE — Assessment & Plan Note (Signed)
Plan for recheck of lipids, test ordered today. ?

## 2022-02-16 NOTE — Patient Instructions (Addendum)
-   Obtain fasting labs with orders provided (can have water or black coffee but otherwise no food or drink x 8 hours before labs) ?- Review information provided ?- Maintain follow-up with gynecologist ?- Dose meloxicam daily x2 weeks (take with food) for right shoulder pain then start exercises from following site: ?https://orthoinfo.[facility].org/globalassets/pdfs/2017-rehab_shoulder.pdf ?- Attend eye doctor annually, dentist every 6 months, work towards or maintain 30 minutes of moderate intensity physical activity at least 5 days per week, and consume a balanced diet ?- Return in 1 year for physical ?- Contact us for any questions between now and then ? ?Can look into these treatments for your ankle symptoms: ?- Platelet rich plasma (regenerative medicine) ?- Hyaluronic acid injection (viscosupplementation) ? ?

## 2022-02-16 NOTE — Progress Notes (Signed)
?  ? ?Annual Physical Exam Visit ? ?Patient Information:  ?Patient ID: Christy Farmer, female DOB: Mar 06, 1986 Age: 36 y.o. MRN: [id number]  ? ?Subjective:  ? ?CC: Annual Physical Exam ? ?HPI:  ?Christy Farmer is here for their annual physical. ? ?I reviewed the past medical history, family history, social history, surgical history, and allergies today and changes were made as necessary.  Please see the problem list section below for additional details. ? ?Past Medical History: ?Past Medical History:  ?Diagnosis Date  ? Abnormal uterine bleeding (AUB)   ? Allergy   ? seasonal allergies.  ? Asthma   ? Constipation   ? Enlarged uterus   ? Gestational diabetes   ? Heavy periods   ? Hepatitis A 09/16/2017  ? History of gestational diabetes 11/26/2019  ? Hyperlipidemia   ? Vaginal Pap smear, abnormal   ? ?Past Surgical History: ?Past Surgical History:  ?Procedure Laterality Date  ? ANKLE SURGERY Right 2019  ? artrhoscopic right ankle surgery - [facility]  ? COLPOSCOPY    ? LIPOSUCTION  01/10/2022  ? laser  ? ?Family History: ?Family History  ?Problem Relation Age of Onset  ? Hyperlipidemia Mother   ? Hypertension Mother   ? Healthy Father   ? Hypertension Brother   ? Hyperlipidemia Brother   ? Heart attack Maternal Grandmother   ? Cancer Neg Hx   ? Diabetes Neg Hx   ? Heart disease Neg Hx   ? ?Allergies: ?No Known Allergies ?Health Maintenance: ?Health Maintenance  ?Topic Date Due  ? COVID-19 Vaccine (4 - Booster for Pfizer series) 12/18/2020  ? INFLUENZA VACCINE  05/16/2022  ? PAP SMEAR-Modifier  04/03/2023  ? TETANUS/TDAP  11/19/2029  ? Hepatitis C Screening  Completed  ? HIV Screening  Completed  ? HPV VACCINES  Aged Out  ?  ?HM Colonoscopy   ? ? This patient has no relevant Health Maintenance data.  ? ?  ? ?Medications: ?Current Outpatient Medications on File Prior to Visit  ?Medication Sig Dispense Refill  ? acetaminophen (TYLENOL) 500 MG tablet Take 500 mg by mouth every 6 (six) hours as needed.     ? BIOTIN PO Take by mouth.    ? clobetasol (TEMOVATE) 0.05 % external solution Apply 1 application. topically 2 (two) times daily.    ? Clobetasol Propionate 0.05 % shampoo Apply 1 application. topically 3 (three) times a week.    ? COLLAGEN PO Take by mouth.    ? ibuprofen (ADVIL) 800 MG tablet Take 1 tablet (800 mg total) by mouth every 8 (eight) hours as needed. 60 tablet 1  ? levonorgestrel (KYLEENA) 19.5 MG IUD by Intrauterine route once. Insertion date 03/16/2020    ? meloxicam (MOBIC) 15 MG tablet Take 15 mg by mouth daily.    ? Turmeric (QC TUMERIC COMPLEX PO) Take by mouth.    ? VITAMIN D PO Take by mouth.    ? ?No current facility-administered medications on file prior to visit.  ? ? ?Review of Systems: No headache, visual changes, nausea, vomiting, diarrhea, constipation, dizziness, abdominal pain, skin rash, fevers, chills, night sweats, swollen lymph nodes, weight loss, chest pain, body aches, joint swelling, muscle aches, shortness of breath, mood changes, visual or auditory hallucinations reported. ? ?Objective:  ? ?Vitals:  ? 02/16/22 0809  ?BP: 110/84  ?Pulse: 69  ?SpO2: 99%  ? ?Vitals:  ? 02/16/22 0809  ?Weight: 158 lb (71.7 kg)  ?Height: 5\' 4"  (1.626 m)  ? ?  Body mass index is 27.12 kg/m?. ? ?General: Well Developed, well nourished, and in no acute distress.  ?Neuro: Alert and oriented x3, extra-ocular muscles intact, sensation grossly intact. Cranial nerves II through XII are grossly intact, motor, sensory, and coordinative functions are intact. ?HEENT: Normocephalic, atraumatic, pupils equal round reactive to light, neck supple, no masses, no lymphadenopathy, thyroid nonpalpable. Oropharynx, nasopharynx, external ear canals are unremarkable. ?Skin: Warm and dry, no rashes noted.  ?Cardiac: Regular rate and rhythm, no murmurs rubs or gallops. No peripheral edema. Pulses symmetric. ?Respiratory: Clear to auscultation bilaterally. Not using accessory muscles, speaking in full sentences.  ?Abdominal:  Soft, nontender, nondistended, positive bowel sounds, no masses, no organomegaly. ?Musculoskeletal: Shoulder, elbow, wrist, hip, knee, ankle stable, and with full range of motion.  Right shoulder with mild discomfort overhead, resisted rotator cuff testing reveals only isolated supraspinatus pain, 5/5 strength, negative Neer's, equivocal Hawkins ? ?Female chaperone initials: KP present throughout the physical examination. ? ?Impression and Recommendations:  ? ?The patient was counselled, risk factors were discussed, and anticipatory guidance given. ? ?Problem List Items Addressed This Visit   ? ?  ? Respiratory  ? Asthma  ?  Currently well controlled, did not require her as needed albuterol inhaler over the last month, will follow this issue as needed. ? ?  ?  ? Relevant Medications  ? albuterol (VENTOLIN HFA) 108 (90 Base) MCG/ACT inhaler  ?  ? Musculoskeletal and Integument  ? Right supraspinatus tendinitis  ?  Incidentally noted during today's examination, focality to the right supraspinatus without weakness.  This is in the setting of the patient being involved in a recent work-out program.  I have advised scheduled meloxicam x2 weeks then return to her previous as needed dosing, start of a home based rehab program with [facility] shoulder conditioning program after meloxicam course, and for recalcitrant symptomatology she can schedule a separate follow-up. ? ?  ?  ? DJD (degenerative joint disease)  ?  Patient has a history of right chronic ankle pain in the setting of multiple corticosteroid injections, arthroscopic surgery unspecified through podiatry group, and due to recalcitrant symptomatology was later evaluated by orthopedic foot and ankle group with MRI revealing bony necrosis.  She was offered ankle fusion surgery but, given her young age, level of activity, this was deferred.  She continues to note pain utilizing meloxicam on a as needed basis to control symptoms.  We did discuss various pharmacologic and  nonpharmacologic treatment strategies inclusive of physical therapy, bracing, viscosupplementation, and platelet rich plasma.  She is also considering additional ankle surgeries and does have upcoming follow-up with orthopedic foot and ankle group which I have encouraged her to maintain.  She will contact us if interested in pursuing any nonsurgical option. ? ?  ?  ? Relevant Medications  ? meloxicam (MOBIC) 15 MG tablet  ?  ? Other  ? Annual physical exam - Primary  ?  Annual examination completed, risk stratification labs ordered, anticipatory guidance provided.  We will follow labs once resulted. ? ?  ?  ? Relevant Orders  ? Apo A1 + B + Ratio  ? CBC  ? Comprehensive metabolic panel  ? Lipid panel  ? TSH  ? VITAMIN D 25 Hydroxy (Vit-D Deficiency, Fractures)  ? Mixed hyperlipidemia  ?  Plan for recheck of lipids, test ordered today. ? ?  ?  ? Relevant Orders  ? Apo A1 + B + Ratio  ? Lipid panel  ? ?Other Visit Diagnoses   ? ?  Screening for lipoid disorders      ? Relevant Orders  ? Apo A1 + B + Ratio  ? Lipid panel  ? Low serum vitamin D      ? Relevant Orders  ? VITAMIN D 25 Hydroxy (Vit-D Deficiency, Fractures)  ? ?  ?  ? ?Orders & Medications ?Medications:  ?Meds ordered this encounter  ?Medications  ? albuterol (VENTOLIN HFA) 108 (90 Base) MCG/ACT inhaler  ?  Sig: Inhale 2 puffs into the lungs every 6 (six) hours as needed for wheezing or shortness of breath.  ?  Dispense:  1 each  ?  Refill:  2  ? ?Orders Placed This Encounter  ?Procedures  ? Apo A1 + B + Ratio  ? CBC  ? Comprehensive metabolic panel  ? Lipid panel  ? TSH  ? VITAMIN D 25 Hydroxy (Vit-D Deficiency, Fractures)  ?  ? ?Return in about 1 year (around 02/17/2023).  ? ? ?Montel Culver, MD ? ? Primary Care Sports Medicine ?[facility] ?[place]  ? ?

## 2022-02-16 NOTE — Assessment & Plan Note (Signed)
Annual examination completed, risk stratification labs ordered, anticipatory guidance provided.  We will follow labs once resulted. 

## 2022-02-17 ENCOUNTER — Telehealth: Payer: Self-pay | Admitting: Family Medicine

## 2022-02-18 LAB — LIPID PANEL
Chol/HDL Ratio: 4.3 ratio (ref 0.0–4.4)
Cholesterol, Total: 203 mg/dL — ABNORMAL HIGH (ref 100–199)
HDL: 47 mg/dL (ref >39)
LDL Chol Calc (NIH): 145 mg/dL — ABNORMAL HIGH (ref 0–99)
Triglycerides: 59 mg/dL (ref 0–149)
VLDL Cholesterol Cal: 11 mg/dL (ref 5–40)

## 2022-02-18 LAB — APO A1 + B + RATIO
Apolipo. B/A-1 Ratio: 0.9 ratio — ABNORMAL HIGH (ref 0.0–0.6)
Apolipoprotein A-1: 125 mg/dL (ref 116–209)
Apolipoprotein B: 118 mg/dL — ABNORMAL HIGH (ref <90)

## 2022-02-18 LAB — COMPREHENSIVE METABOLIC PANEL
ALT: 14 IU/L (ref 0–32)
AST: 14 IU/L (ref 0–40)
Albumin/Globulin Ratio: 1.8 (ref 1.2–2.2)
Albumin: 4.7 g/dL (ref 3.8–4.8)
Alkaline Phosphatase: 44 IU/L (ref 44–121)
BUN/Creatinine Ratio: 11 (ref 9–23)
BUN: 7 mg/dL (ref 6–20)
Bilirubin Total: 0.7 mg/dL (ref 0.0–1.2)
CO2: 22 mmol/L (ref 20–29)
Calcium: 9.4 mg/dL (ref 8.7–10.2)
Chloride: 101 mmol/L (ref 96–106)
Creatinine, Ser: 0.65 mg/dL (ref 0.57–1.00)
Globulin, Total: 2.6 g/dL (ref 1.5–4.5)
Glucose: 90 mg/dL (ref 70–99)
Potassium: 4 mmol/L (ref 3.5–5.2)
Sodium: 139 mmol/L (ref 134–144)
Total Protein: 7.3 g/dL (ref 6.0–8.5)
eGFR: 117 mL/min/{1.73_m2} (ref >59)

## 2022-02-18 LAB — CBC
Hematocrit: 40.7 % (ref 34.0–46.6)
Hemoglobin: 13.7 g/dL (ref 11.1–15.9)
MCH: 29.7 pg (ref 26.6–33.0)
MCHC: 33.7 g/dL (ref 31.5–35.7)
MCV: 88 fL (ref 79–97)
Platelets: 315 10*3/uL (ref 150–450)
RBC: 4.62 x10E6/uL (ref 3.77–5.28)
RDW: 13 % (ref 11.7–15.4)
WBC: 6.7 10*3/uL (ref 3.4–10.8)

## 2022-02-18 LAB — VITAMIN D 25 HYDROXY (VIT D DEFICIENCY, FRACTURES): Vit D, 25-Hydroxy: 33.8 ng/mL (ref 30.0–100.0)

## 2022-02-18 LAB — TSH: TSH: 1.46 u[IU]/mL (ref 0.450–4.500)

## 2022-02-22 ENCOUNTER — Encounter: Payer: Self-pay | Admitting: Obstetrics and Gynecology

## 2022-02-22 ENCOUNTER — Ambulatory Visit (INDEPENDENT_AMBULATORY_CARE_PROVIDER_SITE_OTHER): Payer: Managed Care, Other (non HMO) | Admitting: Obstetrics and Gynecology

## 2022-02-22 VITALS — BP 108/66 | HR 69 | Resp 16 | Ht 64.0 in | Wt 160.2 lb

## 2022-02-22 DIAGNOSIS — N644 Mastodynia: Secondary | ICD-10-CM | POA: Diagnosis not present

## 2022-02-22 NOTE — Progress Notes (Signed)
? ? ?  GYNECOLOGY PROGRESS NOTE ? ?Subjective:  ? ? Patient ID: Christy Farmer, female    DOB: [birth year], 36 y.o.   MRN: [id number] ? ?HPI ? Patient is a 37 y.o. G64P2002 female who presents for right breast pain. She describes the pain as being dull, achy and tender to the touch x 1 month. She does not feel any masses, but breast do feel swollen.  Denies any rashes or nipple discharge. Has tried Ibuprofen with no relief. Did try a warm compress/shower which did help some. Does note that she was working out but stopped ~ 1 month ago.  ? ?Of note, patient does have a Kyleena IUD in place. Notes that she is not really having cycles. Drinks 2 cups of caffeine at most, but some days does not drink any at all.  ? ?The following portions of the patient's history were reviewed and updated as appropriate: allergies, current medications, past family history, past medical history, past social history, past surgical history, and problem list. ? ? ?Review of Systems ?Pertinent items noted in HPI and remainder of comprehensive ROS otherwise negative.  ? ?Objective:  ? Blood pressure 108/66, pulse 69, resp. rate 16, height 5\' 4"  (1.626 m), weight 160 lb 3.2 oz (72.7 kg), currently breastfeeding.  ?General appearance: alert and no distress ?Breasts: breasts appear normal, no suspicious masses, no skin or nipple changes or axillary nodes. Moderate tenderness of right breast.  ? ? ? ?Assessment:  ? ?1. Mastalgia in female   ?  ?Plan:  ? ?No obvious masses present. Pain not relieved with NSAIDs, mild relief with warm compresses/showers. Advised on use of Magnesium and evening primrose oil.  If no relief in the next 1-2 weeks, can consider ultrasound to rule out occult masses, and can initiate Danazol. Encouraged limiting caffeine intake.  ? ?Return if symptoms worsen or fail to improve. ? ? ? , MD ?[facility] ? ?

## 2022-02-22 NOTE — Patient Instructions (Signed)
Try Evening Primrose Oil twice daily to affected breast ?Magnesium 200 mg per day. Can increase to twice a day if no improvement in symptoms in 4-5 days.  ?

## 2022-02-24 ENCOUNTER — Encounter: Payer: Managed Care, Other (non HMO) | Admitting: Obstetrics and Gynecology

## 2022-04-19 ENCOUNTER — Encounter: Payer: Self-pay | Admitting: Family Medicine

## 2022-04-20 MED ORDER — MELOXICAM 15 MG PO TABS
15.0000 mg | ORAL_TABLET | Freq: Every day | ORAL | 0 refills | Status: DC | PRN
Start: 2022-04-20 — End: 2022-06-30

## 2022-04-20 NOTE — Telephone Encounter (Signed)
See telephone encounter for med refill

## 2022-05-04 ENCOUNTER — Encounter: Payer: Managed Care, Other (non HMO) | Admitting: Obstetrics and Gynecology

## 2022-06-26 NOTE — Progress Notes (Unsigned)
GYNECOLOGY ANNUAL PHYSICAL EXAM PROGRESS NOTE  Subjective:    Christy Farmer is a 36 y.o. G20P2002 female who presents for an annual exam. The patient is sexually active. The patient participates in regular exercise: yes. Has the patient ever been transfused or tattooed?: no. The patient reports that there is not domestic violence in her life.   The patient has the following complaints today:  Patient notes that she desires to have her IUD removed.  Notes that her husband is planning to have a vasectomy at the end of this month.  Menstrual History: Menarche age: 55/12 No LMP recorded. (Menstrual status: IUD).    Gynecologic History:  Contraception: Kyleena IUD History of STI's: Denies Last Pap: 04/02/2020.  Results were: NILM, HR HPV+ (neg types 16/18). Had colposcopy 03/2020, with biopsies noting CIN I.  Previous history of abnormal pap smear on 03/25/2019, AGUS, biopsy deferred due to pregnancy. Had EMB 03/2020 which was negative.     Upstream - 06/27/22 1752       Pregnancy Intention Screening   Does the patient want to become pregnant in the next year? No    Does the patient's partner want to become pregnant in the next year? No    Would the patient like to discuss contraceptive options today? Yes      Contraception Wrap Up   Current Method IUD or IUS    End Method Vasectomy;Vaginal Ring    Contraception Counseling Provided Yes    How was the end contraceptive method provided? Prescription            The pregnancy intention screening data noted above was reviewed. Potential methods of contraception were discussed. The patient elected to proceed with Vasectomy; Vaginal Ring.   OB History  Gravida Para Term Preterm AB Living  2 2 2  0 0 2  SAB IAB Ectopic Multiple Live Births  0 0 0 0 2    # Outcome Date GA Lbr Len/2nd Weight Sex Delivery Anes PTL Lv  2 Term 01/18/20 [redacted]w[redacted]d 00:21 / 00:24 6 lb 13.2 oz (3.095 kg) F Vag-Spont None  LIV     Name: Christy Farmer     Apgar1: 8  Apgar5: 9  1 Term 06/14/03   6 lb 9 oz (2.977 kg) M   N LIV    Past Medical History:  Diagnosis Date   Abnormal uterine bleeding (AUB)    Allergy    seasonal allergies.   Asthma    Constipation    Enlarged uterus    Gestational diabetes    Heavy periods    Hepatitis A 09/16/2017   History of gestational diabetes 11/26/2019   Hyperlipidemia    Vaginal Pap smear, abnormal     Past Surgical History:  Procedure Laterality Date   ANKLE SURGERY Right 2019   artrhoscopic right ankle surgery - [facility]   COLPOSCOPY     LIPOSUCTION  01/10/2022   laser    Family History  Problem Relation Age of Onset   Hyperlipidemia Mother    Hypertension Mother    Healthy Father    Hypertension Brother    Hyperlipidemia Brother    Heart attack Maternal Grandmother    Cancer Neg Hx    Diabetes Neg Hx    Heart disease Neg Hx     Social History   Socioeconomic History   Marital status: Single    Spouse name: Not on file   Number of children: 2  Years of education: Not on file   Highest education level: Not on file  Occupational History   Not on file  Tobacco Use   Smoking status: Never   Smokeless tobacco: Never  Vaping Use   Vaping Use: Never used  Substance and Sexual Activity   Alcohol use: Yes    Alcohol/week: 2.0 standard drinks of alcohol    Types: 2 Glasses of wine per week   Drug use: No   Sexual activity: Yes    Birth control/protection: None, I.U.D.    Comment: IUD inserted 03/16/2020  Other Topics Concern   Not on file  Social History Narrative   Not on file   Social Determinants of Health   Financial Resource Strain: Not on file  Food Insecurity: Not on file  Transportation Needs: Not on file  Physical Activity: Inactive (10/18/2017)   Exercise Vital Sign    Days of Exercise per Week: 0 days    Minutes of Exercise per Session: 0 min  Stress: Not on file  Social Connections: Not on file  Intimate Partner Violence: Not on  file    Current Outpatient Medications on File Prior to Visit  Medication Sig Dispense Refill   acetaminophen (TYLENOL) 500 MG tablet Take 500 mg by mouth every 6 (six) hours as needed.     albuterol (VENTOLIN HFA) 108 (90 Base) MCG/ACT inhaler Inhale 2 puffs into the lungs every 6 (six) hours as needed for wheezing or shortness of breath. 1 each 2   BIOTIN PO Take by mouth.     clobetasol (TEMOVATE) 0.05 % external solution Apply 1 application. topically 2 (two) times daily.     Clobetasol Propionate 0.05 % shampoo Apply 1 application. topically 3 (three) times a week.     COLLAGEN PO Take by mouth.     levonorgestrel (KYLEENA) 19.5 MG IUD by Intrauterine route once. Insertion date 03/16/2020     meloxicam (MOBIC) 15 MG tablet Take 1 tablet (15 mg total) by mouth daily as needed for pain. 90 tablet 0   Turmeric (QC TUMERIC COMPLEX PO) Take by mouth.     VITAMIN D PO Take by mouth.     No current facility-administered medications on file prior to visit.    No Known Allergies  Review of Systems Constitutional: negative for chills, fatigue, fevers and sweats Eyes: negative for irritation, redness and visual disturbance Ears, nose, mouth, throat, and face: negative for hearing loss, nasal congestion, snoring and tinnitus Respiratory: negative for asthma, cough, sputum Cardiovascular: negative for chest pain, dyspnea, exertional chest pressure/discomfort, irregular heart beat, palpitations and syncope Gastrointestinal: negative for abdominal pain, change in bowel habits, nausea and vomiting Genitourinary: negative for abnormal menstrual periods, genital lesions, sexual problems and vaginal discharge, dysuria and urinary incontinence Integument/breast: negative for breast lump, breast tenderness and nipple discharge Hematologic/lymphatic: negative for bleeding and easy bruising Musculoskeletal:negative for back pain and muscle weakness Neurological: negative for dizziness, headaches, vertigo  and weakness Endocrine: negative for diabetic symptoms including polydipsia, polyuria and skin dryness Allergic/Immunologic: negative for hay fever and urticaria      Objective:  Blood pressure 115/64, pulse 64, resp. rate 16, height 5\' 4"  (1.626 m), weight 153 lb 6.4 oz (69.6 kg).  Body mass index is 26.33 kg/m.  General Appearance:    Alert, cooperative, no distress, appears stated age  Head:    Normocephalic, without obvious abnormality, atraumatic  Eyes:    PERRL, conjunctiva/corneas clear, EOM's intact, both eyes  Ears:    Normal  external ear canals, both ears  Nose:   Nares normal, septum midline, mucosa normal, no drainage or sinus tenderness  Throat:   Lips, mucosa, and tongue normal; teeth and gums normal  Neck:   Supple, symmetrical, trachea midline, no adenopathy; thyroid: no enlargement/tenderness/nodules; no carotid bruit or JVD  Back:     Symmetric, no curvature, ROM normal, no CVA tenderness  Lungs:     Clear to auscultation bilaterally, respirations unlabored  Chest Wall:    No tenderness or deformity   Heart:    Regular rate and rhythm, S1 and S2 normal, no murmur, rub or gallop  Breast Exam:    No tenderness, masses, or nipple abnormality  Abdomen:     Soft, non-tender, bowel sounds active all four quadrants, no masses, no organomegaly.    Genitalia:    Pelvic:external genitalia normal, vagina without lesions, discharge, or tenderness, rectovaginal septum  normal. Cervix normal in appearance, no cervical motion tenderness, IUD threads visible, ~ 3 cm in length. No adnexal masses or tenderness.  Uterus normal size, shape, mobile, regular contours, nontender.  Rectal:    Normal external sphincter.  No hemorrhoids appreciated. Internal exam not done.   Extremities:   Extremities normal, atraumatic, no cyanosis or edema  Pulses:   2+ and symmetric all extremities  Skin:   Skin color, texture, turgor normal, no rashes or lesions  Lymph nodes:   Cervical, supraclavicular, and  axillary nodes normal  Neurologic:   CNII-XII intact, normal strength, sensation and reflexes throughout   .  Labs:  Lab Results  Component Value Date   WBC 6.7 02/17/2022   HGB 13.7 02/17/2022   HCT 40.7 02/17/2022   MCV 88 02/17/2022   PLT 315 02/17/2022    Lab Results  Component Value Date   CREATININE 0.65 02/17/2022   BUN 7 02/17/2022   NA 139 02/17/2022   K 4.0 02/17/2022   CL 101 02/17/2022   CO2 22 02/17/2022    Lab Results  Component Value Date   ALT 14 02/17/2022   AST 14 02/17/2022   ALKPHOS 44 02/17/2022   BILITOT 0.7 02/17/2022    Lab Results  Component Value Date   TSH 1.460 02/17/2022     Assessment:   1. Encounter for well woman exam with routine gynecological exam   2. Cervical high risk HPV (human papillomavirus) test positive   3. Encounter for IUD removal      Plan:  - Blood tests: UTD, by PCP. - Breast self exam technique reviewed and patient encouraged to perform self-exam monthly. - Contraception: IUD.  Desires removal as husband is planning for vasectomy.  I discussed that patient will still need to use a backup method until vasectomy has been confirmed to be effective which usually may take up to 1 to 2 months after the procedure.  Patient notes understanding, is willing to be on a temporary birth control until that time.  I discussed different methods including pill, patch, vaginal ring, spermicidal inserts, condoms.  Notes that she would like to try the NuvaRing.  Advised on cyclic or continuous use of the NuvaRing if she desires not to have a cycle.  IUD removed today, see procedure note below. - Discussed healthy lifestyle modifications. - Mammogram: To begin screenings at age 62. - Pap smear ordered due to history of previous abnormal Pap smear. - COVID vaccination status: Is up-to-date on series, eligible for booster. - Encouraged to get flu shot. - Follow up in 1 year for  annual exam    IUD Removal  Patient identified,  informed consent performed, consent signed.  Patient was placed in the dorsal lithotomy position, normal external genitalia was noted.  A speculum was placed in the patient's vagina, normal discharge was noted, no lesions. The cervix was visualized, no lesions, no abnormal discharge.  The strings of the IUD were grasped and pulled using ring forceps. The IUD was removed in its entirety. Patient tolerated the procedure well.    Patient will use NuvaRing for contraception. Routine preventative health maintenance measures emphasized.   Hildred Laser, MD [facility]

## 2022-06-27 ENCOUNTER — Encounter: Payer: Self-pay | Admitting: Obstetrics and Gynecology

## 2022-06-27 ENCOUNTER — Ambulatory Visit (INDEPENDENT_AMBULATORY_CARE_PROVIDER_SITE_OTHER): Payer: Managed Care, Other (non HMO) | Admitting: Obstetrics and Gynecology

## 2022-06-27 ENCOUNTER — Other Ambulatory Visit (HOSPITAL_COMMUNITY)
Admission: RE | Admit: 2022-06-27 | Discharge: 2022-06-27 | Disposition: A | Discharge status: Home / Self Care | Payer: Managed Care, Other (non HMO) | Source: Ambulatory Visit | Attending: Obstetrics and Gynecology | Admitting: Obstetrics and Gynecology

## 2022-06-27 VITALS — BP 115/64 | HR 64 | Resp 16 | Ht 64.0 in | Wt 153.4 lb

## 2022-06-27 DIAGNOSIS — R87810 Cervical high risk human papillomavirus (HPV) DNA test positive: Secondary | ICD-10-CM

## 2022-06-27 DIAGNOSIS — Z01419 Encounter for gynecological examination (general) (routine) without abnormal findings: Secondary | ICD-10-CM | POA: Diagnosis present

## 2022-06-27 DIAGNOSIS — Z30432 Encounter for removal of intrauterine contraceptive device: Secondary | ICD-10-CM

## 2022-06-27 DIAGNOSIS — Z124 Encounter for screening for malignant neoplasm of cervix: Secondary | ICD-10-CM | POA: Insufficient documentation

## 2022-06-27 MED ORDER — ETONOGESTREL-ETHINYL ESTRADIOL 0.12-0.015 MG/24HR VA RING: VAGINAL_RING | VAGINAL | 0 refills | Status: DC

## 2022-06-30 ENCOUNTER — Encounter: Payer: Self-pay | Admitting: Family Medicine

## 2022-06-30 ENCOUNTER — Ambulatory Visit (INDEPENDENT_AMBULATORY_CARE_PROVIDER_SITE_OTHER): Payer: Managed Care, Other (non HMO) | Admitting: Family Medicine

## 2022-06-30 ENCOUNTER — Inpatient Hospital Stay (INDEPENDENT_AMBULATORY_CARE_PROVIDER_SITE_OTHER): Payer: Managed Care, Other (non HMO) | Admitting: Radiology

## 2022-06-30 VITALS — BP 118/88 | HR 78 | Ht 64.0 in | Wt 154.0 lb

## 2022-06-30 DIAGNOSIS — M7591 Shoulder lesion, unspecified, right shoulder: Secondary | ICD-10-CM | POA: Diagnosis not present

## 2022-06-30 DIAGNOSIS — M7711 Lateral epicondylitis, right elbow: Secondary | ICD-10-CM | POA: Diagnosis not present

## 2022-06-30 MED ORDER — DICLOFENAC SODIUM 75 MG PO TBEC: 75.0000 mg | DELAYED_RELEASE_TABLET | Freq: Two times a day (BID) | ORAL | 0 refills | Status: DC | PRN

## 2022-06-30 MED ORDER — TRIAMCINOLONE ACETONIDE 40 MG/ML IJ SUSP
40.0000 mg | Freq: Once | INTRAMUSCULAR | Status: AC
  Administered 2022-06-30: 40 mg via INTRAMUSCULAR

## 2022-06-30 NOTE — Patient Instructions (Signed)
You have just been given a cortisone injection to reduce pain and inflammation. After the injection you may notice immediate relief of pain as a result of the Lidocaine. It is important to rest the area of the injection for 24 to 48 hours after the injection. There is a possibility of some temporary increased discomfort and swelling for up to 72 hours until the cortisone begins to work. If you do have pain, simply rest the joint and use ice. If you can tolerate over the counter medications, you can try Tylenol for added relief per package instructions. - As above, relative rest for 2 days from upper body strenuous activity/athletics - Dose diclofenac twice daily with food x7 days then as needed - Referral coordinator will contact you to schedule physical therapy for the shoulder and elbow - Return for follow-up in 6 weeks, contact her office for any questions between now and then

## 2022-06-30 NOTE — Progress Notes (Signed)
Primary Care / Sports Medicine Office Visit  Patient Information:  Patient ID: Christy Farmer, female DOB: 28-Oct-1985 Age: 36 y.o. MRN: [id number]   Christy Farmer is a pleasant 36 y.o. female presenting with the following:  Chief Complaint  Patient presents with   Shoulder Pain    Right, for 1 month, hears a clicking noise. States she has been having problems with tennis elbow for a long time. Has been working out more recently.     Vitals:   06/30/22 0945  BP: 118/88  Pulse: 78  SpO2: 99%   Vitals:   06/30/22 0945  Weight: 154 lb (69.9 kg)  Height: 5\' 4"  (1.626 m)   Body mass index is 26.43 kg/m.  No results found.   Independent interpretation of notes and tests performed by another provider:   None  Procedures performed:   Procedure:  Injection of right subacromial space under ultrasound guidance. Ultrasound guidance utilized for in-plane approach to the right subacromial space, no sonographic evidence of discrete tendon disruption noted Samsung HS60 device utilized with permanent recording / reporting. Verbal informed consent obtained and verified. Skin prepped in a sterile fashion. Ethyl chloride for topical local analgesia.  Completed without difficulty and tolerated well. Medication: triamcinolone acetonide 40 mg/mL suspension for injection 1 mL total and 2 mL lidocaine 1% without epinephrine utilized for needle placement anesthetic Advised to contact for fevers/chills, erythema, induration, drainage, or persistent bleeding.   Pertinent History, Exam, Impression, and Recommendations:   Problem List Items Addressed This Visit       Musculoskeletal and Integument   Right supraspinatus tendinitis - Primary    Patient presents for follow-up to chronic right shoulder pain in the setting of previously noted supraspinatus associated impingement. She did dose meloxicam and home exercises somewhat, still noting persistent pain and progression of  symptoms / new-onset symptoms at ipsilateral elbow.  Examination significant for showing 4+/5 strength with isolated supraspinatus testing associate with pain, positive impingement, equivocal speeds and Yergason's.  Given her persist symptomatology despite scheduled meloxicam, home exercises, we did discuss additional treatment strategies and she elected to proceed with ultrasound-guided subacromial corticosteroid injection.  Additionally, transition to diclofenac twice daily x1 week then as needed, start formal physical therapy, and maintain follow-up in 6 weeks for reevaluation.      Relevant Medications   diclofenac (VOLTAREN) 75 MG EC tablet   Other Relevant Orders   Ambulatory referral to Physical Therapy   LIMITED JOINT SPACE STRUCTURES UP RIGHT   Right lateral epicondylitis    Right-hand-dominant patient presenting with right lateral elbow pain, atraumatic in onset over the past few months in the setting of chronic right shoulder pain.  Denies any radiation distally, no change in activity, no paresthesias, treatment has included meloxicam for comorbid shoulder symptoms with modest response.  Examination is consistent with lateral epicondylitis with provocative testing showing the same.  Her findings can be considered a secondary/compensatory nature due to longstanding right shoulder pain.  In addition to treating the shoulder symptoms directly, I have advised a scheduled dose of twice daily diclofenac 75 mg x 1 week then as needed, formal PT, and close follow-up in 6 weeks.  Recalcitrant symptoms can be addressed with ultrasound-guided corticosteroid injection.      Relevant Medications   diclofenac (VOLTAREN) 75 MG EC tablet   Other Relevant Orders   Ambulatory referral to Physical Therapy     Orders & Medications Meds ordered this encounter  Medications  diclofenac (VOLTAREN) 75 MG EC tablet    Sig: Take 1 tablet (75 mg total) by mouth 2 (two) times daily as needed.     Dispense:  30 tablet    Refill:  0   triamcinolone acetonide (KENALOG-40) injection 40 mg   Orders Placed This Encounter  Procedures   [place] LIMITED JOINT SPACE STRUCTURES UP RIGHT   Ambulatory referral to Physical Therapy     Return in about 6 weeks (around 08/11/2022).     Jerrol Banana, MD   Primary Care Sports Medicine [facility]

## 2022-06-30 NOTE — Assessment & Plan Note (Signed)
Right-hand-dominant patient presenting with right lateral elbow pain, atraumatic in onset over the past few months in the setting of chronic right shoulder pain.  Denies any radiation distally, no change in activity, no paresthesias, treatment has included meloxicam for comorbid shoulder symptoms with modest response.  Examination is consistent with lateral epicondylitis with provocative testing showing the same.  Her findings can be considered a secondary/compensatory nature due to longstanding right shoulder pain.  In addition to treating the shoulder symptoms directly, I have advised a scheduled dose of twice daily diclofenac 75 mg x 1 week then as needed, formal PT, and close follow-up in 6 weeks.  Recalcitrant symptoms can be addressed with ultrasound-guided corticosteroid injection.

## 2022-06-30 NOTE — Assessment & Plan Note (Signed)
Patient presents for follow-up to chronic right shoulder pain in the setting of previously noted supraspinatus associated impingement. She did dose meloxicam and home exercises somewhat, still noting persistent pain and progression of symptoms / new-onset symptoms at ipsilateral elbow.  Examination significant for showing 4+/5 strength with isolated supraspinatus testing associate with pain, positive impingement, equivocal speeds and Yergason's.  Given her persist symptomatology despite scheduled meloxicam, home exercises, we did discuss additional treatment strategies and she elected to proceed with ultrasound-guided subacromial corticosteroid injection.  Additionally, transition to diclofenac twice daily x1 week then as needed, start formal physical therapy, and maintain follow-up in 6 weeks for reevaluation.

## 2022-07-03 ENCOUNTER — Ambulatory Visit: Payer: Managed Care, Other (non HMO) | Admitting: Family Medicine

## 2022-07-05 LAB — CYTOLOGY - PAP: Diagnosis: NEGATIVE

## 2022-07-20 ENCOUNTER — Ambulatory Visit: Payer: Managed Care, Other (non HMO) | Attending: Family Medicine

## 2022-07-20 DIAGNOSIS — M25521 Pain in right elbow: Secondary | ICD-10-CM | POA: Diagnosis present

## 2022-07-20 DIAGNOSIS — M6281 Muscle weakness (generalized): Secondary | ICD-10-CM | POA: Diagnosis present

## 2022-07-20 DIAGNOSIS — M25611 Stiffness of right shoulder, not elsewhere classified: Secondary | ICD-10-CM | POA: Diagnosis present

## 2022-07-20 DIAGNOSIS — M7591 Shoulder lesion, unspecified, right shoulder: Secondary | ICD-10-CM | POA: Insufficient documentation

## 2022-07-20 DIAGNOSIS — M25511 Pain in right shoulder: Secondary | ICD-10-CM | POA: Insufficient documentation

## 2022-07-20 DIAGNOSIS — M7711 Lateral epicondylitis, right elbow: Secondary | ICD-10-CM | POA: Diagnosis not present

## 2022-07-20 NOTE — Therapy (Signed)
OUTPATIENT PHYSICAL THERAPY SHOULDER EVALUATION   Patient Name: Christy Farmer MRN: [id number] DOB:04-Mar-1986, 36 y.o., female Today's Date: 07/21/2022   PT End of Session - 07/21/22 1417     Visit Number 1    Number of Visits 17    Date for PT Re-Evaluation 09/15/22    Authorization Type eval: 07/20/22            Past Medical History:  Diagnosis Date   Abnormal uterine bleeding (AUB)    Allergy    seasonal allergies.   Asthma    Constipation    Enlarged uterus    Gestational diabetes    Heavy periods    Hepatitis A 09/16/2017   History of gestational diabetes 11/26/2019   Hyperlipidemia    Vaginal Pap smear, abnormal    Past Surgical History:  Procedure Laterality Date   ANKLE SURGERY Right 2019   artrhoscopic right ankle surgery - [facility]   COLPOSCOPY     LIPOSUCTION  01/10/2022   laser   Patient Active Problem List   Diagnosis Date Noted   Right lateral epicondylitis 06/30/2022   Mixed hyperlipidemia 02/16/2022   Right supraspinatus tendinitis 02/16/2022   DJD (degenerative joint disease) 02/16/2022   Viral hepatitis A without hepatic coma 10/18/2017   Jaundice 09/16/2017   Annual physical exam 12/01/2015   Asthma 08/13/2015   History of abnormal cervical Pap smear 06/15/2015   Enlarged uterus 06/15/2015   Menorrhagia 06/15/2015   Dysmenorrhea 06/15/2015    PCP: Montel Culver, MD  REFERRING PROVIDER: Montel Culver, MD  REFERRING DIAGNOSIS:   THERAPY DIAG: Acute pain of right shoulder - Plan: PT plan of care cert/re-cert  Muscle weakness (generalized) - Plan: PT plan of care cert/re-cert  Stiffness of right shoulder, not elsewhere classified - Plan: PT plan of care cert/re-cert  Pain in right elbow - Plan: PT plan of care cert/re-cert  RATIONALE FOR EVALUATION AND TREATMENT: Rehabilitation  ONSET DATE: 06/21/22 (approximate)  FOLLOW UP APPT WITH PROVIDER: Yes, 08/11/22 with Dr. Zigmund Daniel   SUBJECTIVE:                                                                                                                                                                                          Chief Complaint: R shoulder and R elbow pain;   Pertinent History Pt reports that she has been experiencing R shoulder pain since at least April/May of this year. It started after she began exercising more frequently and she was doing overhead exercises. She saw her PCP and tried meloxicam without improvement eventually agreeing to a steroid injection in her R  shoulder. This has helped considerably with her pain. She was transitioned to diclofenac prn. She still notices painful clicking in the R shoulder with circumduction. She has also been having pain in her lateral R elbow for the last 4 months. She tried a tennis elbow strap with some improvement noted during exercise.   Pain (R shoulder):  Pain Intensity: Present: 0/10, Best: 0/10, Worst: 3/10 Pain location: Posterior R shoulder Pain Quality: sharp and aching  Radiating: Yes, radiates down to the right elbow Numbness/Tingling: No Focal Weakness: Yes, improved since injection Aggravating factors: R shoulder circumduction, putting away dishes, carry 36 year old, reaching overhead, rolling onto R shoulder Relieving factors: steroid injection, stretches, no help with topical pain rubs 24-hour pain behavior: worse at end of day History of prior shoulder or neck/shoulder injury, pain, surgery, or therapy: No Falls: Has patient fallen in last 6 months? 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 Hobbies: exercise, spending time with the family Red flags: Positive: night sweats, Negative: personal history of cancer, chills/fever, night sweats, nausea, vomiting, unexplained weight gain/loss;  Precautions: None  Weight Bearing Restrictions:  No  Living Environment Lives with: lives with their family, spouse, 2 kids, and patient's mother Lives in: House/apartment  Patient Goals: Decrease R shoulder pain so she can return to full pain-free function at home and work as well as with leisure activities such as exercise.   OBJECTIVE:   Patient Surveys  FOTO: 60, predicted improvement to 44 QuickDASH: To be completed  Cognition Patient is oriented to person, place, and time.  Recent memory is intact.  Remote memory is intact.  Attention span and concentration are intact.  Expressive speech is intact.  Patient's fund of knowledge is within normal limits for educational level.    Gross Musculoskeletal Assessment Tremor: None Bulk: Normal Tone: Normal  Gait Deferred  Posture Grossly WNL however full posture assessment deferred  Cervical Screen AROM: WFL and painless with overpressure in all planes Spurlings A (ipsilateral lateral flexion/axial compression): R: Negative L: Negative Spurlings B (ipsilateral lateral flexion/contralateral rotation/axial compression): R: Negative L: Negative Repeated movement: No centralization or peripheralization with protraction or retraction Hoffman Sign (cervical cord compression): R: Positive L: Positive ULTT Median: R: Negative L: Negative ULTT Ulnar: R: Negative L: Negative ULTT Radial: R: Negative L: Negative  AROM  AROM (Normal range in degrees) AROM 07/21/2022  Cervical  Flexion (50) WNL  Extension (80) WNL  Right lateral flexion (45) WNL  Left lateral flexion (45) WNL  Right rotation (85) WNL  Left rotation (85) Mild limitation   Right Left  Shoulder    Flexion 172* 171  Extension    Abduction 152* 166  External Rotation 97 90  Internal Rotation 72 72  Hands Behind Head T5* T7  Hands Behind Back T9* T3      Elbow    Flexion WNL WNL  Extension WNL WNL  Pronation    Supination    (* = pain; Blank rows = not tested)  LE MMT:  MMT (out of 5)  Right 07/21/2022 Left 07/21/2022  Cervical (isometric)  Flexion WNL  Extension WNL  Lateral Flexion WNL WNL  Rotation WNL WNL      Shoulder   Flexion 4+ 5  Extension    Abduction 5 5  External rotation 5 5  Internal rotation 5 5  Horizontal  abduction 5 5  Horizontal adduction    Lower Trapezius 3+ 3+  Rhomboids 5 5      Elbow  Flexion 5 5  Extension 5 5  Pronation 5 5  Supination 5* 5      Wrist  Flexion 5* 5  Extension 5 5  Radial deviation    Ulnar deviation        MCP  Flexion 5 5  Extension 5 5  Abduction 5 5  Adduction 5 5  (* = pain; Blank rows = not tested)  Painful resisted MCP extension digit 3  Sensation Grossly intact to light touch bilateral UE as determined by testing dermatomes C2-T2. Proprioception and hot/cold testing deferred on this date.  Reflexes Deferred  Palpation  Location LEFT  RIGHT           Subocciptials  0  Cervical paraspinals  0  Upper Trapezius  0  Levator Scapulae  0  Rhomboid Major/Minor    Sternoclavicular joint    Acromioclavicular joint    Coracoid process    Long head of biceps    Supraspinatus  1  Infraspinatus  1  Subscapularis    Teres Minor    Teres Major    Pectoralis Major    Pectoralis Minor    Anterior Deltoid  0  Lateral Deltoid  0  Posterior Deltoid  0  Latissimus Dorsi    Sternocleidomastoid    (Blank rows = not tested) Graded on 0-4 scale (0 = no pain, 1 = pain, 2 = pain with wincing/grimacing/flinching, 3 = pain with withdrawal, 4 = unwilling to allow palpation), (Blank rows = not tested)  Repeated Movements Deferred  Passive Accessory Intervertebral Motion Pt denies reproduction of shoulder pain with CPA C2-T7 and UPA bilaterally C2-T7. Grossly normal mobility throughout  Accessory Motions/Glides Deferred  Muscle Length Testing Deferred  SPECIAL TESTS Rotator Cuff  Drop Arm Test: Negative Painful Arc (Pain from 60 to 120 degrees scaption): Negative Infraspinatus Muscle Test:  Negative  Subacromial Impingement Hawkins-Kennedy: Positive Neer (Block scapula, PROM flexion): Positive Painful Arc (Pain from 60 to 120 degrees scaption): Negative Empty Can: Positive External Rotation Resistance: Negative Horizontal Adduction: Positive Scapular Assist: Not examined  Labral Tear Biceps Load II (120 elevation, full ER, 90 elbow flexion, full supination, resisted elbow flexion): Not examined Crank (160 scaption, axial load with IR/ER): Not examined Active Compression Test: Not examined  Bicep Tendon Pathology Speed (shoulder flexion to 90, external rotation, full elbow extension, and forearm supination with resistance: Positive Yergason's (resisted shoulder ER and supination/biceps tendon pathology): Negative  Shoulder Instability Sulcus Sign: Negative Anterior Apprehension: Negative  Beighton scale: Deferred   TODAY'S TREATMENT  None  PATIENT EDUCATION:  Education details: Plan of care Person educated: Patient Education method: Explanation Education comprehension: verbalized understanding  HOME EXERCISE PROGRAM: None currently   ASSESSMENT:  CLINICAL IMPRESSION: Patient is a 36 y.o. female who was seen today for physical therapy evaluation and treatment for R shoulder pain. Objective impairments include decreased ROM, decreased strength, and pain. These impairments are limiting patient from meal prep, community activity, and exercise . Personal factors including Time since onset of injury/illness/exacerbation are also affecting patient's functional outcome. Patient will benefit from skilled PT to address above impairments and improve overall function.  REHAB POTENTIAL: Excellent  CLINICAL DECISION MAKING: Stable/uncomplicated  EVALUATION COMPLEXITY: Low   GOALS: Goals reviewed with patient? Yes  SHORT TERM GOALS: Target date: 08/17/2022  Pt will be independent with HEP to improve strength and  decrease shoulder pain to improve pain-free  function at home, work, and when exercising Baseline:  Goal status: INITIAL   LONG TERM GOALS: Target date: 09/14/2022  Pt will increase FOTO to at least 75 to demonstrate significant improvement in function at home and work related to neck pain  Baseline: 07/20/22: 60 Goal status: INITIAL  2.  Pt will report at least 75% improvement in R shoulder symptoms in order to demonstrate clinically significant reduction in shoulder pain. Baseline: Goal status: INITIAL  3.  Pt will decrease quick DASH score by at least 8% in order to demonstrate clinically significant reduction in disability related to shoulder pain        Baseline: 07/20/22: To be completed Goal status: INITIAL  4. Pt will increase R low trap strength to at least 4/5 in order to demonstrate improvement in strength and function         Baseline: 07/20/22: 3+/5 Goal status: INITIAL   PLAN: PT FREQUENCY: 1-2x/week  PT DURATION: 8 weeks  PLANNED INTERVENTIONS: Therapeutic exercises, Therapeutic activity, Neuromuscular re-education, Balance training, Gait training, Patient/Family education, Joint manipulation, Joint mobilization, Vestibular training, Canalith repositioning, Aquatic Therapy, Dry Needling, Electrical stimulation, Spinal manipulation, Spinal mobilization, Cryotherapy, Moist heat, Traction, Ultrasound, Ionotophoresis 4mg /ml Dexamethasone, and Manual therapy  PLAN FOR NEXT SESSION: Assess R elbow pain, consider TDN for supraspinatus, progressive RTC and upper quarter strengthening, assess posture, issue HEP;  Zaniah Titterington PT, DPT, [facility]  Breeanne Oblinger 07/21/2022, 2:39 PM

## 2022-07-22 ENCOUNTER — Other Ambulatory Visit: Payer: Self-pay | Admitting: Family Medicine

## 2022-07-24 NOTE — Telephone Encounter (Signed)
Unable to refill per protocol, Rx expired. Medication was discontinued by PCP 06/30/22. Will refuse.   Requested Prescriptions  Pending Prescriptions Disp Refills  . meloxicam (MOBIC) 15 MG tablet [Pharmacy Med Name: MELOXICAM 15 MG TABLET] 90 tablet 0    Sig: TAKE 1 TABLET BY MOUTH EVERY DAY AS NEEDED FOR PAIN     Analgesics:  COX2 Inhibitors Failed - 07/22/2022  9:09 AM      Failed - Manual Review: Labs are only required if the patient has taken medication for more than 8 weeks.      Passed - HGB in normal range and within 360 days    Hemoglobin  Date Value Ref Range Status  02/17/2022 13.7 11.1 - 15.9 g/dL Final         Passed - Cr in normal range and within 360 days    Creat  Date Value Ref Range Status  11/21/2016 0.63 0.50 - 1.10 mg/dL Final   Creatinine, Ser  Date Value Ref Range Status  02/17/2022 0.65 0.57 - 1.00 mg/dL Final         Passed - HCT in normal range and within 360 days    Hematocrit  Date Value Ref Range Status  02/17/2022 40.7 34.0 - 46.6 % Final         Passed - AST in normal range and within 360 days    AST  Date Value Ref Range Status  02/17/2022 14 0 - 40 IU/L Final         Passed - ALT in normal range and within 360 days    ALT  Date Value Ref Range Status  02/17/2022 14 0 - 32 IU/L Final         Passed - eGFR is 30 or above and within 360 days    GFR, Est African American  Date Value Ref Range Status  11/21/2016 >89 >=60 mL/min Final   GFR calc Af Amer  Date Value Ref Range Status  01/26/2020 >60 >60 mL/min Final   GFR, Est Non African American  Date Value Ref Range Status  11/21/2016 >89 >=60 mL/min Final   GFR calc non Af Amer  Date Value Ref Range Status  01/26/2020 >60 >60 mL/min Final   eGFR  Date Value Ref Range Status  02/17/2022 117 >59 mL/min/1.73 Final         Passed - Patient is not pregnant      Passed - Valid encounter within last 12 months    Recent Outpatient Visits          3 weeks ago Right  supraspinatus tendinitis   [facility] [place], Earley Abide, MD   5 months ago Annual physical exam   [facility] [place], Earley Abide, MD   3 years ago Encounter for surveillance of abnormal nevi   [place], FNP   3 years ago Mild intermittent asthma without complication   [place], [place], FNP   4 years ago Left-sided headache   [place], Astrid Divine, [place]      Future Appointments            In 2 weeks Zigmund Daniel, Earley Abide, MD [place] [facility], [facility]   In 7 months Zigmund Daniel, Earley Abide, MD [facility] at [facility],  [facility]

## 2022-07-25 ENCOUNTER — Ambulatory Visit: Payer: Managed Care, Other (non HMO)

## 2022-07-25 DIAGNOSIS — M25511 Pain in right shoulder: Secondary | ICD-10-CM

## 2022-07-25 DIAGNOSIS — M6281 Muscle weakness (generalized): Secondary | ICD-10-CM

## 2022-07-25 DIAGNOSIS — M25521 Pain in right elbow: Secondary | ICD-10-CM

## 2022-07-25 DIAGNOSIS — M25611 Stiffness of right shoulder, not elsewhere classified: Secondary | ICD-10-CM

## 2022-07-25 NOTE — Therapy (Signed)
OUTPATIENT PHYSICAL THERAPY SHOULDER TREATMENT    Patient Name: Christy Farmer MRN: [id number] DOB:11/04/1985, 36 y.o., female Today's Date: 07/25/2022   PT End of Session - 07/25/22 1311     Visit Number 2    Number of Visits 17    Date for PT Re-Evaluation 09/15/22    Authorization Type eval: 07/20/22    PT Start Time 1315    PT Stop Time 1358    PT Time Calculation (min) 43 min    Activity Tolerance Patient tolerated treatment well    Behavior During Therapy WFL for tasks assessed/performed            Past Medical History:  Diagnosis Date   Abnormal uterine bleeding (AUB)    Allergy    seasonal allergies.   Asthma    Constipation    Enlarged uterus    Gestational diabetes    Heavy periods    Hepatitis A 09/16/2017   History of gestational diabetes 11/26/2019   Hyperlipidemia    Vaginal Pap smear, abnormal    Past Surgical History:  Procedure Laterality Date   ANKLE SURGERY Right 2019   artrhoscopic right ankle surgery - [facility]   COLPOSCOPY     LIPOSUCTION  01/10/2022   laser   Patient Active Problem List   Diagnosis Date Noted   Right lateral epicondylitis 06/30/2022   Mixed hyperlipidemia 02/16/2022   Right supraspinatus tendinitis 02/16/2022   DJD (degenerative joint disease) 02/16/2022   Viral hepatitis A without hepatic coma 10/18/2017   Jaundice 09/16/2017   Annual physical exam 12/01/2015   Asthma 08/13/2015   History of abnormal cervical Pap smear 06/15/2015   Enlarged uterus 06/15/2015   Menorrhagia 06/15/2015   Dysmenorrhea 06/15/2015    PCP: Montel Culver, MD  REFERRING PROVIDER: Montel Culver, MD  REFERRING DIAGNOSIS:   THERAPY DIAG: Acute pain of right shoulder  Muscle weakness (generalized)  Stiffness of right shoulder, not elsewhere classified  Pain in right elbow  RATIONALE FOR EVALUATION AND TREATMENT: Rehabilitation  ONSET DATE: 06/21/22 (approximate)  FOLLOW UP APPT WITH PROVIDER: Yes,  08/11/22 with Dr. Zigmund Daniel   SUBJECTIVE:                                                                                                                                                                                         Chief Complaint: R shoulder and R elbow pain;   Pertinent History Pt reports that she has been experiencing R shoulder pain since at least April/May of this year. It started after she began exercising more frequently and she was doing overhead exercises. She saw her  PCP and tried meloxicam without improvement eventually agreeing to a steroid injection in her R shoulder. This has helped considerably with her pain. She was transitioned to diclofenac prn. She still notices painful clicking in the R shoulder with circumduction. She has also been having pain in her lateral R elbow for the last 4 months. She tried a tennis elbow strap with some improvement noted during exercise.   Pain (R shoulder):  Pain Intensity: Present: 0/10, Best: 0/10, Worst: 3/10 Pain location: Posterior R shoulder Pain Quality: sharp and aching  Radiating: Yes, radiates down to the right elbow Numbness/Tingling: No Focal Weakness: Yes, improved since injection Aggravating factors: R shoulder circumduction, putting away dishes, carry 36 year old, reaching overhead, rolling onto R shoulder Relieving factors: steroid injection, stretches, no help with topical pain rubs 24-hour pain behavior: worse at end of day History of prior shoulder or neck/shoulder injury, pain, surgery, or therapy: No Falls: Has patient fallen in last 6 months? 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 Hobbies: exercise, spending time with the family Red flags: Positive: night sweats, Negative: personal history of cancer, chills/fever, night sweats, nausea, vomiting, unexplained weight  gain/loss;  Precautions: None  Weight Bearing Restrictions: No  Living Environment Lives with: lives with their family, spouse, 2 kids, and patient's mother Lives in: House/apartment  Patient Goals: Decrease R shoulder pain so she can return to full pain-free function at home and work as well as with leisure activities such as exercise.   OBJECTIVE:   Patient Surveys  FOTO: 60, predicted improvement to 63 QuickDASH: To be completed  Cognition Patient is oriented to person, place, and time.  Recent memory is intact.  Remote memory is intact.  Attention span and concentration are intact.  Expressive speech is intact.  Patient's fund of knowledge is within normal limits for educational level.    Gross Musculoskeletal Assessment Tremor: None Bulk: Normal Tone: Normal  Gait Deferred  Posture Grossly WNL however full posture assessment deferred  Cervical Screen AROM: WFL and painless with overpressure in all planes Spurlings A (ipsilateral lateral flexion/axial compression): R: Negative L: Negative Spurlings B (ipsilateral lateral flexion/contralateral rotation/axial compression): R: Negative L: Negative Repeated movement: No centralization or peripheralization with protraction or retraction Hoffman Sign (cervical cord compression): R: Positive L: Positive ULTT Median: R: Negative L: Negative ULTT Ulnar: R: Negative L: Negative ULTT Radial: R: Negative L: Negative  AROM  AROM (Normal range in degrees) AROM 07/25/2022  Cervical  Flexion (50) WNL  Extension (80) WNL  Right lateral flexion (45) WNL  Left lateral flexion (45) WNL  Right rotation (85) WNL  Left rotation (85) Mild limitation   Right Left  Shoulder    Flexion 172* 171  Extension    Abduction 152* 166  External Rotation 97 90  Internal Rotation 72 72  Hands Behind Head T5* T7  Hands Behind Back T9* T3      Elbow    Flexion WNL WNL  Extension WNL WNL  Pronation    Supination    (* = pain;  Blank rows = not tested)  LE MMT:  MMT (out of 5) Right 07/25/2022 Left 07/25/2022  Cervical (isometric)  Flexion WNL  Extension WNL  Lateral Flexion WNL WNL  Rotation WNL WNL      Shoulder   Flexion 4+ 5  Extension  Abduction 5 5  External rotation 5 5  Internal rotation 5 5  Horizontal abduction 5 5  Horizontal adduction    Lower Trapezius 3+ 3+  Rhomboids 5 5      Elbow  Flexion 5 5  Extension 5 5  Pronation 5 5  Supination 5* 5      Wrist  Flexion 5* 5  Extension 5 5  Radial deviation    Ulnar deviation        MCP  Flexion 5 5  Extension 5 5  Abduction 5 5  Adduction 5 5  (* = pain; Blank rows = not tested)  Painful resisted MCP extension digit 3  Sensation Grossly intact to light touch bilateral UE as determined by testing dermatomes C2-T2. Proprioception and hot/cold testing deferred on this date.  Reflexes Deferred  Palpation  Location LEFT  RIGHT           Subocciptials  0  Cervical paraspinals  0  Upper Trapezius  0  Levator Scapulae  0  Rhomboid Major/Minor    Sternoclavicular joint    Acromioclavicular joint    Coracoid process    Long head of biceps    Supraspinatus  1  Infraspinatus  1  Subscapularis    Teres Minor    Teres Major    Pectoralis Major    Pectoralis Minor    Anterior Deltoid  0  Lateral Deltoid  0  Posterior Deltoid  0  Latissimus Dorsi    Sternocleidomastoid    (Blank rows = not tested) Graded on 0-4 scale (0 = no pain, 1 = pain, 2 = pain with wincing/grimacing/flinching, 3 = pain with withdrawal, 4 = unwilling to allow palpation), (Blank rows = not tested)  Repeated Movements Deferred  Passive Accessory Intervertebral Motion Pt denies reproduction of shoulder pain with CPA C2-T7 and UPA bilaterally C2-T7. Grossly normal mobility throughout  Accessory Motions/Glides Deferred  Muscle Length Testing Deferred  SPECIAL TESTS Rotator Cuff  Drop Arm Test: Negative Painful Arc (Pain from 60 to 120  degrees scaption): Negative Infraspinatus Muscle Test: Negative  Subacromial Impingement Hawkins-Kennedy: Positive Neer (Block scapula, PROM flexion): Positive Painful Arc (Pain from 60 to 120 degrees scaption): Negative Empty Can: Positive External Rotation Resistance: Negative Horizontal Adduction: Positive Scapular Assist: Not examined  Labral Tear Biceps Load II (120 elevation, full ER, 90 elbow flexion, full supination, resisted elbow flexion): Not examined Crank (160 scaption, axial load with IR/ER): Not examined Active Compression Test: Not examined  Bicep Tendon Pathology Speed (shoulder flexion to 90, external rotation, full elbow extension, and forearm supination with resistance: Positive Yergason's (resisted shoulder ER and supination/biceps tendon pathology): Negative  Shoulder Instability Sulcus Sign: Negative Anterior Apprehension: Negative  Beighton scale: Deferred   TODAY'S TREATMENT    SUBJECTIVE: Pt endorses soreness in R shoulder. States working out Friday. States baseline pain is 2/10 but remains high since working out up to 4/10 NPS. Demonstrates pain with resisted shoulder flexion with pronated grip and shoulder IR up to 90 deg flexion.  Also endorses R elbow pain 4/10 as well. Primarily reports gripping hurts elbow pain. Also has pain opening jars due to pain. 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    THERE.EX:  Pt only has pain with palpation to extensor hood of R extensors. Pain with resisted extension. No pain with passive motions in all planes or other resisted wrist motions. Full mobility noted.   Wrist AROM  (  R/L): Equal and full bilat    Grip strength avg of 3 trials:   L: 57.5, 54.7, 55.2 lbs/3 = 55.8 lbs   R: 65.9, 66.3, 66.1 lbs = 66.1 lbs   Healthy females in 3rd decade: 31 Kg on R, 29 Kg on L    Outcome measure:    Quick Dash: 36.4%   Extensive education on provocative positions to avoid  to allow decreased inflammatory response to shoulder and R elbow. Discussion of thumb and hand positioning, avoiding painful overhead movements, and neutral grip to prevent lateral elbow pain.  Development of HEP:   Seated R wirst extensor stretch: 3x30 sec holds. VC's to limit pain, only stretch discomfort.  Scap retractions: 3x15, black TB. Educated on performance on pulley system Standing low row/shoulder extension: Green TB. 2x15.  1x15 with black TB   Y lower trap lift offs on wall: x10. Mild shoulder pain    Good form/technique noted. Educated to trial Y lift offs but is painful to discontinue. Educated on gym alternatives with cable machines.   PATIENT EDUCATION:  Education details: Plan of care. Avoiding aggravating motions, shoulder/hand/elbow positioning. HEP Person educated: Patient Education method: Explanation Education comprehension: verbalized understanding  HOME EXERCISE PROGRAM: Access Code: [email or web address] URL: [email or web address] Date: 07/25/2022 Prepared by: Larna Daughters  Exercises - Standing Wrist Flexion Stretch  - 1 x daily - 7 x weekly - 2 sets - 3 reps - 30 hold - Scapular Retraction with Resistance  - 1 x daily - 7 x weekly - 3 sets - 10 reps - Scapular Retraction with Resistance Advanced  - 1 x daily - 7 x weekly - 3 sets - 10 reps - Low Trap Setting at [place]  - 1 x daily - 7 x weekly - 3 sets - 10 reps   ASSESSMENT:  CLINICAL IMPRESSION: Pt reports to clinic with increased elbow and shoulder pain. Further eval info gather this date. Full wrist mobility noted but TTP along extensor hood of R wrist and resisted extension. Findings consistent with lateral epicondylitis. Education provided on HEP, avoiding provocative motions that cause concordant pain. Pt will continue to benefit from skilled PT services to address deficits in pain, shoulder AROM, and muscular weakness to return to PLOF.   From PT EVAL:  Patient is a 36 y.o. female who was seen  today for physical therapy evaluation and treatment for R shoulder pain. Objective impairments include decreased ROM, decreased strength, and pain. These impairments are limiting patient from meal prep, community activity, and exercise . Personal factors including Time since onset of injury/illness/exacerbation are also affecting patient's functional outcome. Patient will benefit from skilled PT to address above impairments and improve overall function.  REHAB POTENTIAL: Excellent  CLINICAL DECISION MAKING: Stable/uncomplicated  EVALUATION COMPLEXITY: Low   GOALS: Goals reviewed with patient? Yes  SHORT TERM GOALS: Target date: 08/17/2022  Pt will be independent with HEP to improve strength and decrease shoulder pain to improve pain-free function at home, work, and when exercising Baseline:  Goal status: INITIAL   LONG TERM GOALS: Target date: 09/14/2022  Pt will increase FOTO to at least 75 to demonstrate significant improvement in function at home and work related to neck pain  Baseline: 07/20/22: 60 Goal status: INITIAL  2.  Pt will report at least 75% improvement in R shoulder symptoms in order to demonstrate clinically significant reduction in shoulder pain. Baseline: Goal status: INITIAL  3.  Pt will decrease quick DASH score by at least 8% in order to  demonstrate clinically significant reduction in disability related to shoulder pain        Baseline: 07/20/22: To be completed; 10/10: 36.4% Goal status: INITIAL  4. Pt will increase R low trap strength to at least 4/5 in order to demonstrate improvement in strength and function         Baseline: 07/20/22: 3+/5 Goal status: INITIAL   PLAN: PT FREQUENCY: 1-2x/week  PT DURATION: 8 weeks  PLANNED INTERVENTIONS: Therapeutic exercises, Therapeutic activity, Neuromuscular re-education, Balance training, Gait training, Patient/Family education, Joint manipulation, Joint mobilization, Vestibular training, Canalith repositioning,  Aquatic Therapy, Dry Needling, Electrical stimulation, Spinal manipulation, Spinal mobilization, Cryotherapy, Moist heat, Traction, Ultrasound, Ionotophoresis 4mg /ml Dexamethasone, and Manual therapy  PLAN FOR NEXT SESSION: Reassess HEP. Alternative lower trap strengthening exercises and shoulder ER strengthening. Address elbow/shoulder pain.  Salem Caster. Fairly IV, PT, DPT Physical Therapist- [facility]  07/25/2022, 2:16 PM

## 2022-08-02 ENCOUNTER — Ambulatory Visit
Admission: RE | Admit: 2022-08-02 | Discharge: 2022-08-02 | Disposition: A | Discharge status: Home / Self Care | Payer: Managed Care, Other (non HMO) | Source: Ambulatory Visit | Attending: Urgent Care | Admitting: Urgent Care

## 2022-08-02 VITALS — BP 105/76 | HR 73 | Temp 98.8°F | Resp 17

## 2022-08-02 DIAGNOSIS — B9689 Other specified bacterial agents as the cause of diseases classified elsewhere: Secondary | ICD-10-CM

## 2022-08-02 DIAGNOSIS — J019 Acute sinusitis, unspecified: Secondary | ICD-10-CM

## 2022-08-02 MED ORDER — AMOXICILLIN-POT CLAVULANATE 875-125 MG PO TABS: 1.0000 | ORAL_TABLET | Freq: Two times a day (BID) | ORAL | 0 refills | Status: DC

## 2022-08-02 MED ORDER — ALBUTEROL SULFATE HFA 108 (90 BASE) MCG/ACT IN AERS: 1.0000 | INHALATION_SPRAY | Freq: Four times a day (QID) | RESPIRATORY_TRACT | 0 refills | Status: DC | PRN

## 2022-08-02 NOTE — Discharge Instructions (Addendum)
Follow up here or with your primary care provider if your symptoms are worsening or not improving with treatment.     

## 2022-08-02 NOTE — Therapy (Signed)
OUTPATIENT PHYSICAL THERAPY SHOULDER TREATMENT  Patient Name: Christy Farmer MRN: [id number] DOB:1986-09-18, 36 y.o., female Today's Date: 08/03/2022   PT End of Session - 08/03/22 1149     Visit Number 3    Number of Visits 17    Date for PT Re-Evaluation 09/15/22    Authorization Type eval: 07/20/22    PT Start Time 1150    PT Stop Time 1230    PT Time Calculation (min) 40 min    Activity Tolerance Patient tolerated treatment well    Behavior During Therapy WFL for tasks assessed/performed            Past Medical History:  Diagnosis Date   Abnormal uterine bleeding (AUB)    Allergy    seasonal allergies.   Asthma    Constipation    Enlarged uterus    Gestational diabetes    Heavy periods    Hepatitis A 09/16/2017   History of gestational diabetes 11/26/2019   Hyperlipidemia    Vaginal Pap smear, abnormal    Past Surgical History:  Procedure Laterality Date   ANKLE SURGERY Right 2019   artrhoscopic right ankle surgery - [facility]   COLPOSCOPY     LIPOSUCTION  01/10/2022   laser   Patient Active Problem List   Diagnosis Date Noted   Right lateral epicondylitis 06/30/2022   Mixed hyperlipidemia 02/16/2022   Right supraspinatus tendinitis 02/16/2022   DJD (degenerative joint disease) 02/16/2022   Viral hepatitis A without hepatic coma 10/18/2017   Jaundice 09/16/2017   Annual physical exam 12/01/2015   Asthma 08/13/2015   History of abnormal cervical Pap smear 06/15/2015   Enlarged uterus 06/15/2015   Menorrhagia 06/15/2015   Dysmenorrhea 06/15/2015    PCP: Jerrol Banana, MD  REFERRING PROVIDER: Jerrol Banana, MD  REFERRING DIAGNOSIS:   THERAPY DIAG: Acute pain of right shoulder  Muscle weakness (generalized)  Stiffness of right shoulder, not elsewhere classified  RATIONALE FOR EVALUATION AND TREATMENT: Rehabilitation  ONSET DATE: 06/21/22 (approximate)  FOLLOW UP APPT WITH PROVIDER: Yes, 08/11/22 with Dr.  Ashley Royalty  FROM INITIAL EVALUATION SUBJECTIVE:                                                                                                                                                                                         Chief Complaint: R shoulder and R elbow pain;  Pertinent History Pt reports that she has been experiencing R shoulder pain since at least April/May of this year. It started after she began exercising more frequently and she was doing overhead exercises. She saw her PCP and tried meloxicam without improvement  eventually agreeing to a steroid injection in her R shoulder. This has helped considerably with her pain. She was transitioned to diclofenac prn. She still notices painful clicking in the R shoulder with circumduction. She has also been having pain in her lateral R elbow for the last 4 months. She tried a tennis elbow strap with some improvement noted during exercise.   Pain (R shoulder):  Pain Intensity: Present: 0/10, Best: 0/10, Worst: 3/10 Pain location: Posterior R shoulder Pain Quality: sharp and aching  Radiating: Yes, radiates down to the right elbow Numbness/Tingling: No Focal Weakness: Yes, improved since injection Aggravating factors: R shoulder circumduction, putting away dishes, carry 36 year old, reaching overhead, rolling onto R shoulder Relieving factors: steroid injection, stretches, no help with topical pain rubs 24-hour pain behavior: worse at end of day History of prior shoulder or neck/shoulder injury, pain, surgery, or therapy: No Falls: Has patient fallen in last 6 months? 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 Hobbies: exercise, spending time with the family Red flags: Positive: night sweats, Negative: personal history of cancer, chills/fever, night sweats, nausea, vomiting, unexplained weight  gain/loss;  Precautions: None  Weight Bearing Restrictions: No  Living Environment Lives with: lives with their family, spouse, 2 kids, and patient's mother Lives in: House/apartment  Patient Goals: Decrease R shoulder pain so she can return to full pain-free function at home and work as well as with leisure activities such as exercise.  OBJECTIVE:   Patient Surveys  FOTO: 60, predicted improvement to 45 QuickDASH: To be completed  Cognition Patient is oriented to person, place, and time.  Recent memory is intact.  Remote memory is intact.  Attention span and concentration are intact.  Expressive speech is intact.  Patient's fund of knowledge is within normal limits for educational level.    Gross Musculoskeletal Assessment Tremor: None Bulk: Normal Tone: Normal  Gait Deferred  Posture Grossly WNL however full posture assessment deferred  Cervical Screen AROM: WFL and painless with overpressure in all planes Spurlings A (ipsilateral lateral flexion/axial compression): R: Negative L: Negative Spurlings B (ipsilateral lateral flexion/contralateral rotation/axial compression): R: Negative L: Negative Repeated movement: No centralization or peripheralization with protraction or retraction Hoffman Sign (cervical cord compression): R: Positive L: Positive ULTT Median: R: Negative L: Negative ULTT Ulnar: R: Negative L: Negative ULTT Radial: R: Negative L: Negative  AROM  AROM (Normal range in degrees) AROM 08/03/2022  Cervical  Flexion (50) WNL  Extension (80) WNL  Right lateral flexion (45) WNL  Left lateral flexion (45) WNL  Right rotation (85) WNL  Left rotation (85) Mild limitation   Right Left  Shoulder    Flexion 172* 171  Extension    Abduction 152* 166  External Rotation 97 90  Internal Rotation 72 72  Hands Behind Head T5* T7  Hands Behind Back T9* T3      Elbow    Flexion WNL WNL  Extension WNL WNL  Pronation    Supination    (* = pain;  Blank rows = not tested)  LE MMT:  MMT (out of 5) Right 08/03/2022 Left 08/03/2022  Cervical (isometric)  Flexion WNL  Extension WNL  Lateral Flexion WNL WNL  Rotation WNL WNL      Shoulder   Flexion 4+ 5  Extension    Abduction 5 5  External rotation 5  5  Internal rotation 5 5  Horizontal abduction 5 5  Horizontal adduction    Lower Trapezius 3+ 3+  Rhomboids 5 5      Elbow  Flexion 5 5  Extension 5 5  Pronation 5 5  Supination 5* 5      Wrist  Flexion 5* 5  Extension 5 5  Radial deviation    Ulnar deviation        MCP  Flexion 5 5  Extension 5 5  Abduction 5 5  Adduction 5 5  (* = pain; Blank rows = not tested)  Painful resisted MCP extension digit 3  Sensation Grossly intact to light touch bilateral UE as determined by testing dermatomes C2-T2. Proprioception and hot/cold testing deferred on this date.  Reflexes Deferred  Palpation  Location LEFT  RIGHT           Subocciptials  0  Cervical paraspinals  0  Upper Trapezius  0  Levator Scapulae  0  Rhomboid Major/Minor    Sternoclavicular joint    Acromioclavicular joint    Coracoid process    Long head of biceps    Supraspinatus  1  Infraspinatus  1  Subscapularis    Teres Minor    Teres Major    Pectoralis Major    Pectoralis Minor    Anterior Deltoid  0  Lateral Deltoid  0  Posterior Deltoid  0  Latissimus Dorsi    Sternocleidomastoid    (Blank rows = not tested) Graded on 0-4 scale (0 = no pain, 1 = pain, 2 = pain with wincing/grimacing/flinching, 3 = pain with withdrawal, 4 = unwilling to allow palpation), (Blank rows = not tested)  Repeated Movements Deferred  Passive Accessory Intervertebral Motion Pt denies reproduction of shoulder pain with CPA C2-T7 and UPA bilaterally C2-T7. Grossly normal mobility throughout  Accessory Motions/Glides Deferred  Muscle Length Testing Deferred  SPECIAL TESTS Rotator Cuff  Drop Arm Test: Negative Painful Arc (Pain from 60 to 120  degrees scaption): Negative Infraspinatus Muscle Test: Negative  Subacromial Impingement Hawkins-Kennedy: Positive Neer (Block scapula, PROM flexion): Positive Painful Arc (Pain from 60 to 120 degrees scaption): Negative Empty Can: Positive External Rotation Resistance: Negative Horizontal Adduction: Positive Scapular Assist: Not examined  Labral Tear Biceps Load II (120 elevation, full ER, 90 elbow flexion, full supination, resisted elbow flexion): Not examined Crank (160 scaption, axial load with IR/ER): Not examined Active Compression Test: Not examined  Bicep Tendon Pathology Speed (shoulder flexion to 90, external rotation, full elbow extension, and forearm supination with resistance: Positive Yergason's (resisted shoulder ER and supination/biceps tendon pathology): Negative  Shoulder Instability Sulcus Sign: Negative Anterior Apprehension: Negative  Beighton scale: Deferred  Limited R elbow assessment (07/25/22)  Pt only has pain with palpation to extensor hood of R extensors. Pain with resisted extension. No pain with passive motions in all planes or other resisted wrist motions. Full mobility noted.   Wrist AROM  (R/L): Equal and full bilat    Grip strength avg of 3 trials:  L: 57.5, 54.7, 55.2 lbs/3 = 55.8 lbs  R: 65.9, 66.3, 66.1 lbs = 66.1 lbs  Healthy females in 3rd decade: 31 Kg on R, 29 Kg on L   QuickDash: 36.4%   TODAY'S TREATMENT    SUBJECTIVE: Pt reports gradual improvement in R shoulder pain however her R elbow has remained very painful. She has not been working out this week due to feeling until secondary to sinusitis. No specific concerns currently.  PAIN: R lateral elbow pain   Ther-ex  Seated R wrist extension isometric strengthening 10s hold/10s relax x 5; Seated straight elbow R wrist flexion stretch (to stretch extensor group) 3 x 30s spread throughout session; Updated HEP and reviewed with patient;   Manual Therapy  IASTM to R  dorsal forearm with sweeping and fanning strokes targeting extensor/supinator mm; Ice massage to common extensor tendon until analgesia obtained (approximately 5 minutes); Supine R radial head AP mobilizations, grade III, 30s/bout x 4 bouts; Attempted radial head distraction however discontinued secondary to pain; Supine bent elbow R ulnar distraction mobilizations, grade III, 30s/bout x 3 bouts;   Trigger Point Dry Needling (TDN), unbilled Education performed with patient regarding potential benefit of TDN. Reviewed precautions and risks with patient. Pt provided verbal consent to treatment. In seated using clean technique and forearm supported, TDN performed to R dorsal forearm extensor group (focus on ECRB) with 2, 0.25 x 40 single needle placements with local twitch response (LTR). Pistoning technique utilized.    PATIENT EDUCATION:  Education details: Plan of care. Updated HEP, ice massage, avoiding aggravating motions, shoulder/hand/elbow positioning. Person educated: Patient Education method: Explanation, verbal cues, tactile cues, handout Education comprehension: verbalized understanding and returned demonstration  HOME EXERCISE PROGRAM: Access Code: [email or web address] URL: [email or web address] Date: 08/03/2022 Prepared by: Ria Comment  Exercises - Standing Wrist Flexion Stretch (Mirrored)  - 1 x daily - 7 x weekly - 2 sets - 3 reps - 30 hold - Scapular Retraction with Resistance  - 1 x daily - 7 x weekly - 3 sets - 10 reps - Scapular Retraction with Resistance Advanced  - 1 x daily - 7 x weekly - 3 sets - 10 reps - Low Trap Setting at Wall  - 1 x daily - 7 x weekly - 3 sets - 10 reps - Isometric Wrist Extension Pronated (Mirrored)  - 2 x daily - 7 x weekly - 10 reps - 10s hold followed by 10s relax hold  Patient Education - Tennis Elbow   ASSESSMENT:  CLINICAL IMPRESSION: Pt arrives complaining of increased R elbow pain so session focused on this issue today.  Introduced manual techniques including radial head mobilizations and IASTM. Also utilized ice massage and trigger point dry needling. Updated HeP to include pain-free isometric strengthening. Pt will continue to benefit from skilled PT services to address deficits in pain, shoulder AROM, and muscular weakness to return to PLOF.    REHAB POTENTIAL: Excellent  CLINICAL DECISION MAKING: Stable/uncomplicated  EVALUATION COMPLEXITY: Low   GOALS: Goals reviewed with patient? Yes  SHORT TERM GOALS: Target date: 08/17/2022  Pt will be independent with HEP to improve strength and decrease shoulder pain to improve pain-free function at home, work, and when exercising Baseline:  Goal status: INITIAL   LONG TERM GOALS: Target date: 09/14/2022  Pt will increase FOTO to at least 75 to demonstrate significant improvement in function at home and work related to neck pain  Baseline: 07/20/22: 60 Goal status: INITIAL  2.  Pt will report at least 75% improvement in R shoulder symptoms in order to demonstrate clinically significant reduction in shoulder pain. Baseline: Goal status: INITIAL  3.  Pt will decrease quick DASH score by at least 8% in order to demonstrate clinically significant reduction in disability related to shoulder pain        Baseline: 07/20/22: To be completed; 10/10: 36.4% Goal status: INITIAL  4. Pt will increase R low trap strength to at least 4/5 in order to demonstrate  improvement in strength and function         Baseline: 07/20/22: 3+/5 Goal status: INITIAL   PLAN: PT FREQUENCY: 1-2x/week  PT DURATION: 8 weeks  PLANNED INTERVENTIONS: Therapeutic exercises, Therapeutic activity, Neuromuscular re-education, Balance training, Gait training, Patient/Family education, Joint manipulation, Joint mobilization, Vestibular training, Canalith repositioning, Aquatic Therapy, Dry Needling, Electrical stimulation, Spinal manipulation, Spinal mobilization, Cryotherapy, Moist heat,  Traction, Ultrasound, Ionotophoresis 4mg /ml Dexamethasone, and Manual therapy  PLAN FOR NEXT SESSION: Reassess HEP. Alternative lower trap strengthening exercises and shoulder ER strengthening. Address elbow/shoulder pain.  Lynnea MaizesJason D Loraina Stauffer PT, DPT, GCS  Physical Therapist- [facility]  [facility]  08/03/2022, 1:13 PM

## 2022-08-02 NOTE — ED Provider Notes (Addendum)
[name]    CSN: [id number] Arrival date & time: 08/02/22  1304      History   Chief Complaint Chief Complaint  Patient presents with   Cough    Entered by patient    HPI Christy Farmer is a 36 y.o. female.    Cough   Presents to UC with complaint of symptoms times 1+ weeks.  Endorses body aches and sore throat starting 8 days ago.  She states the symptoms of myalgias resolved and she traveled to [place] last week.  [place]ed [place] with  productive cough (green sputum) with still sore throat and new laryngitis.  She also endorses sinus pain/pressure.  Past Medical History:  Diagnosis Date   Abnormal uterine bleeding (AUB)    Allergy    seasonal allergies.   Asthma    Constipation    Enlarged uterus    Gestational diabetes    Heavy periods    Hepatitis A 09/16/2017   History of gestational diabetes 11/26/2019   Hyperlipidemia    Vaginal Pap smear, abnormal     Patient Active Problem List   Diagnosis Date Noted   Right lateral epicondylitis 06/30/2022   Mixed hyperlipidemia 02/16/2022   Right supraspinatus tendinitis 02/16/2022   DJD (degenerative joint disease) 02/16/2022   Viral hepatitis A without hepatic coma 10/18/2017   Jaundice 09/16/2017   Annual physical exam 12/01/2015   Asthma 08/13/2015   History of abnormal cervical Pap smear 06/15/2015   Enlarged uterus 06/15/2015   Menorrhagia 06/15/2015   Dysmenorrhea 06/15/2015    Past Surgical History:  Procedure Laterality Date   ANKLE SURGERY Right 2019   artrhoscopic right ankle surgery - [facility]   COLPOSCOPY     LIPOSUCTION  01/10/2022   laser    OB History     Gravida  2   Para  2   Term  2   Preterm      AB      Living  2      SAB      IAB      Ectopic      Multiple  0   Live Births  2            Home Medications    Prior to Admission medications   Medication Sig Start Date End Date Taking? Authorizing Provider   acetaminophen (TYLENOL) 500 MG tablet Take 500 mg by mouth every 6 (six) hours as needed.    [provider]  albuterol (VENTOLIN HFA) 108 (90 Base) MCG/ACT inhaler Inhale 2 puffs into the lungs every 6 (six) hours as needed for wheezing or shortness of breath. 02/16/22   Jerrol Banana, MD  BIOTIN PO Take by mouth.    [provider]  clobetasol (TEMOVATE) 0.05 % external solution Apply 1 application. topically 2 (two) times daily. 12/22/21   [provider]  Clobetasol Propionate 0.05 % shampoo Apply 1 application. topically 3 (three) times a week. 12/22/21   [provider]  COLLAGEN PO Take by mouth.    [provider]  diclofenac (VOLTAREN) 75 MG EC tablet Take 1 tablet (75 mg total) by mouth 2 (two) times daily as needed. 06/30/22   Jerrol Banana, MD  etonogestrel-ethinyl estradiol (NUVARING) 0.12-0.015 MG/24HR vaginal ring Insert vaginally and leave in place for 3 consecutive weeks, then remove for 1 week. 06/27/22   Hildred Laser, MD  Turmeric (QC TUMERIC COMPLEX PO) Take by mouth.  [provider]  VITAMIN D PO Take by mouth.    [provider]    Family History Family History  Problem Relation Age of Onset   Hyperlipidemia Mother    Hypertension Mother    Healthy Father    Hypertension Brother    Hyperlipidemia Brother    Heart attack Maternal Grandmother    Cancer Neg Hx    Diabetes Neg Hx    Heart disease Neg Hx     Social History Social History   Tobacco Use   Smoking status: Never   Smokeless tobacco: Never  Vaping Use   Vaping Use: Never used  Substance Use Topics   Alcohol use: Yes    Alcohol/week: 2.0 standard drinks of alcohol    Types: 2 Glasses of wine per week   Drug use: No     Allergies   Patient has no known allergies.   Review of Systems Review of Systems  Respiratory:  Positive for cough.      Physical Exam Triage Vital Signs ED Triage Vitals  Enc Vitals Group     BP       Pulse      Resp      Temp      Temp src      SpO2      Weight      Height      Head Circumference      Peak Flow      Pain Score      Pain Loc      Pain Edu?      Excl. in GC?    No data found.  Updated Vital Signs There were no vitals taken for this visit.  Visual Acuity Right Eye Distance:   Left Eye Distance:   Bilateral Distance:    Right Eye Near:   Left Eye Near:    Bilateral Near:     Physical Exam Vitals reviewed.  Constitutional:      Appearance: Normal appearance.  HENT:     Mouth/Throat:     Pharynx: Posterior oropharyngeal erythema present. No oropharyngeal exudate.     Tonsils: No tonsillar exudate.  Cardiovascular:     Rate and Rhythm: Normal rate and regular rhythm.     Pulses: Normal pulses.     Heart sounds: Normal heart sounds.  Pulmonary:     Effort: Pulmonary effort is normal.     Breath sounds: Normal breath sounds.  Skin:    General: Skin is warm and dry.  Neurological:     General: No focal deficit present.     Mental Status: She is alert and oriented to person, place, and time.  Psychiatric:        Mood and Affect: Mood normal.        Behavior: Behavior normal.      UC Treatments / Results  Labs (all labs ordered are listed, but only abnormal results are displayed) Labs Reviewed - No data to display  EKG   Radiology No results found.  Procedures Procedures (including critical care time)  Medications Ordered in UC Medications - No data to display  Initial Impression / Assessment and Plan / UC Course  I have reviewed the triage vital signs and the nursing notes.  Pertinent labs & imaging results that were available during my care of the patient were reviewed by me and considered in my medical decision making (see chart for details).   Lungs CTAB.  Mild pharyngeal erythema without  presence of peritonsillar exudates.  Suspect bacterial secondary rhinosinusitis infection and bronchitis following viral respiratory  infection.  Will treat with antibiotic.   Final Clinical Impressions(s) / UC Diagnoses   Final diagnoses:  None   Discharge Instructions   None    ED Prescriptions   None    PDMP not reviewed this encounter.   Rose Phi, [place] 08/02/22 1323    Rose Phi, Donna 09/18/22 1901

## 2022-08-02 NOTE — ED Triage Notes (Signed)
Pt. Presents to UC w/ c/o increased phlegm and a sore throat. Pt. Sates she started feeling bad last Tuesday and it started w/ body aches. Pt. Also states since being sick she has lost her voice. Pt. Has recently returned from the [place]

## 2022-08-03 ENCOUNTER — Ambulatory Visit: Payer: Managed Care, Other (non HMO)

## 2022-08-03 DIAGNOSIS — M25511 Pain in right shoulder: Secondary | ICD-10-CM

## 2022-08-03 DIAGNOSIS — M6281 Muscle weakness (generalized): Secondary | ICD-10-CM

## 2022-08-03 DIAGNOSIS — M25611 Stiffness of right shoulder, not elsewhere classified: Secondary | ICD-10-CM

## 2022-08-09 ENCOUNTER — Ambulatory Visit: Payer: Managed Care, Other (non HMO)

## 2022-08-09 DIAGNOSIS — M25511 Pain in right shoulder: Secondary | ICD-10-CM | POA: Diagnosis not present

## 2022-08-09 DIAGNOSIS — M25521 Pain in right elbow: Secondary | ICD-10-CM

## 2022-08-09 DIAGNOSIS — M25611 Stiffness of right shoulder, not elsewhere classified: Secondary | ICD-10-CM

## 2022-08-09 NOTE — Therapy (Signed)
OUTPATIENT PHYSICAL THERAPY SHOULDER TREATMENT  Patient Name: Christy Farmer MRN: [id number] DOB:1986-09-07, 36 y.o., female Today's Date: 08/09/2022   PT End of Session - 08/09/22 1154     Visit Number 4    Number of Visits 17    Date for PT Re-Evaluation 09/15/22    Authorization Type eval: 07/20/22    PT Start Time 1150    PT Stop Time 1230    PT Time Calculation (min) 40 min    Activity Tolerance Patient tolerated treatment well    Behavior During Therapy WFL for tasks assessed/performed             Past Medical History:  Diagnosis Date   Abnormal uterine bleeding (AUB)    Allergy    seasonal allergies.   Asthma    Constipation    Enlarged uterus    Gestational diabetes    Heavy periods    Hepatitis A 09/16/2017   History of gestational diabetes 11/26/2019   Hyperlipidemia    Vaginal Pap smear, abnormal    Past Surgical History:  Procedure Laterality Date   ANKLE SURGERY Right 2019   artrhoscopic right ankle surgery - [facility]   COLPOSCOPY     LIPOSUCTION  01/10/2022   laser   Patient Active Problem List   Diagnosis Date Noted   Right lateral epicondylitis 06/30/2022   Mixed hyperlipidemia 02/16/2022   Right supraspinatus tendinitis 02/16/2022   DJD (degenerative joint disease) 02/16/2022   Viral hepatitis A without hepatic coma 10/18/2017   Jaundice 09/16/2017   Annual physical exam 12/01/2015   Asthma 08/13/2015   History of abnormal cervical Pap smear 06/15/2015   Enlarged uterus 06/15/2015   Menorrhagia 06/15/2015   Dysmenorrhea 06/15/2015    PCP: Jerrol Banana, MD  REFERRING PROVIDER: Jerrol Banana, MD  REFERRING DIAGNOSIS:   THERAPY DIAG: Pain in right elbow  Stiffness of right shoulder, not elsewhere classified  RATIONALE FOR EVALUATION AND TREATMENT: Rehabilitation  ONSET DATE: 06/21/22 (approximate)  FOLLOW UP APPT WITH PROVIDER: Yes, 08/11/22 with Dr. Ashley Royalty  FROM INITIAL EVALUATION SUBJECTIVE:                                                                                                                                                                                          Chief Complaint: R shoulder and R elbow pain;  Pertinent History Pt reports that she has been experiencing R shoulder pain since at least April/May of this year. It started after she began exercising more frequently and she was doing overhead exercises. She saw her PCP and tried meloxicam without improvement eventually agreeing to a  steroid injection in her R shoulder. This has helped considerably with her pain. She was transitioned to diclofenac prn. She still notices painful clicking in the R shoulder with circumduction. She has also been having pain in her lateral R elbow for the last 4 months. She tried a tennis elbow strap with some improvement noted during exercise.   Pain (R shoulder):  Pain Intensity: Present: 0/10, Best: 0/10, Worst: 3/10 Pain location: Posterior R shoulder Pain Quality: sharp and aching  Radiating: Yes, radiates down to the right elbow Numbness/Tingling: No Focal Weakness: Yes, improved since injection Aggravating factors: R shoulder circumduction, putting away dishes, carry 36 year old, reaching overhead, rolling onto R shoulder Relieving factors: steroid injection, stretches, no help with topical pain rubs 24-hour pain behavior: worse at end of day History of prior shoulder or neck/shoulder injury, pain, surgery, or therapy: No Falls: Has patient fallen in last 6 months? 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 Hobbies: exercise, spending time with the family Red flags: Positive: night sweats, Negative: personal history of cancer, chills/fever, night sweats, nausea, vomiting, unexplained weight gain/loss;  Precautions: None  Weight Bearing  Restrictions: No  Living Environment Lives with: lives with their family, spouse, 2 kids, and patient's mother Lives in: House/apartment  Patient Goals: Decrease R shoulder pain so she can return to full pain-free function at home and work as well as with leisure activities such as exercise.  OBJECTIVE:   Patient Surveys  FOTO: 60, predicted improvement to 57 QuickDASH: To be completed  Cognition Patient is oriented to person, place, and time.  Recent memory is intact.  Remote memory is intact.  Attention span and concentration are intact.  Expressive speech is intact.  Patient's fund of knowledge is within normal limits for educational level.    Gross Musculoskeletal Assessment Tremor: None Bulk: Normal Tone: Normal  Gait Deferred  Posture Grossly WNL however full posture assessment deferred  Cervical Screen AROM: WFL and painless with overpressure in all planes Spurlings A (ipsilateral lateral flexion/axial compression): R: Negative L: Negative Spurlings B (ipsilateral lateral flexion/contralateral rotation/axial compression): R: Negative L: Negative Repeated movement: No centralization or peripheralization with protraction or retraction Hoffman Sign (cervical cord compression): R: Positive L: Positive ULTT Median: R: Negative L: Negative ULTT Ulnar: R: Negative L: Negative ULTT Radial: R: Negative L: Negative  AROM  AROM (Normal range in degrees) AROM 08/09/2022  Cervical  Flexion (50) WNL  Extension (80) WNL  Right lateral flexion (45) WNL  Left lateral flexion (45) WNL  Right rotation (85) WNL  Left rotation (85) Mild limitation   Right Left  Shoulder    Flexion 172* 171  Extension    Abduction 152* 166  External Rotation 97 90  Internal Rotation 72 72  Hands Behind Head T5* T7  Hands Behind Back T9* T3      Elbow    Flexion WNL WNL  Extension WNL WNL  Pronation    Supination    (* = pain; Blank rows = not tested)  LE MMT:  MMT (out of 5)  Right 08/09/2022 Left 08/09/2022  Cervical (isometric)  Flexion WNL  Extension WNL  Lateral Flexion WNL WNL  Rotation WNL WNL      Shoulder   Flexion 4+ 5  Extension    Abduction 5 5  External rotation 5 5  Internal rotation  5 5  Horizontal abduction 5 5  Horizontal adduction    Lower Trapezius 3+ 3+  Rhomboids 5 5      Elbow  Flexion 5 5  Extension 5 5  Pronation 5 5  Supination 5* 5      Wrist  Flexion 5* 5  Extension 5 5  Radial deviation    Ulnar deviation        MCP  Flexion 5 5  Extension 5 5  Abduction 5 5  Adduction 5 5  (* = pain; Blank rows = not tested)  Painful resisted MCP extension digit 3  Sensation Grossly intact to light touch bilateral UE as determined by testing dermatomes C2-T2. Proprioception and hot/cold testing deferred on this date.  Reflexes Deferred  Palpation  Location LEFT  RIGHT           Subocciptials  0  Cervical paraspinals  0  Upper Trapezius  0  Levator Scapulae  0  Rhomboid Major/Minor    Sternoclavicular joint    Acromioclavicular joint    Coracoid process    Long head of biceps    Supraspinatus  1  Infraspinatus  1  Subscapularis    Teres Minor    Teres Major    Pectoralis Major    Pectoralis Minor    Anterior Deltoid  0  Lateral Deltoid  0  Posterior Deltoid  0  Latissimus Dorsi    Sternocleidomastoid    (Blank rows = not tested) Graded on 0-4 scale (0 = no pain, 1 = pain, 2 = pain with wincing/grimacing/flinching, 3 = pain with withdrawal, 4 = unwilling to allow palpation), (Blank rows = not tested)  Repeated Movements Deferred  Passive Accessory Intervertebral Motion Pt denies reproduction of shoulder pain with CPA C2-T7 and UPA bilaterally C2-T7. Grossly normal mobility throughout  Accessory Motions/Glides Deferred  Muscle Length Testing Deferred  SPECIAL TESTS Rotator Cuff  Drop Arm Test: Negative Painful Arc (Pain from 60 to 120 degrees scaption): Negative Infraspinatus Muscle Test:  Negative  Subacromial Impingement Hawkins-Kennedy: Positive Neer (Block scapula, PROM flexion): Positive Painful Arc (Pain from 60 to 120 degrees scaption): Negative Empty Can: Positive External Rotation Resistance: Negative Horizontal Adduction: Positive Scapular Assist: Not examined  Labral Tear Biceps Load II (120 elevation, full ER, 90 elbow flexion, full supination, resisted elbow flexion): Not examined Crank (160 scaption, axial load with IR/ER): Not examined Active Compression Test: Not examined  Bicep Tendon Pathology Speed (shoulder flexion to 90, external rotation, full elbow extension, and forearm supination with resistance: Positive Yergason's (resisted shoulder ER and supination/biceps tendon pathology): Negative  Shoulder Instability Sulcus Sign: Negative Anterior Apprehension: Negative  Beighton scale: Deferred  Limited R elbow assessment (07/25/22)  Pt only has pain with palpation to extensor hood of R extensors. Pain with resisted extension. No pain with passive motions in all planes or other resisted wrist motions. Full mobility noted.   Wrist AROM  (R/L): Equal and full bilat    Grip strength avg of 3 trials:  L: 57.5, 54.7, 55.2 lbs/3 = 55.8 lbs  R: 65.9, 66.3, 66.1 lbs = 66.1 lbs  Healthy females in 3rd decade: 31 Kg on R, 29 Kg on L   QuickDash: 36.4%   TODAY'S TREATMENT    SUBJECTIVE: Pt reports her R shoulder pain continues to improve. However she has been having persistent soreness in her R elbow. She worked out at [employer] yesterday and noted pain with all the exercises requiring active R wrist extension. She  has not been consistent with her HEP.   PAIN: R lateral elbow soreness, not rated   Ther-ex  Seated R wrist extension eccentric strengthening 5-6s lowering relax 2 x 10; Seated R wrist radial deviation eccentric strengthening 5-6s lowering relax 2 x 10; Seated straight elbow R wrist flexion stretch (to stretch extensor group) 3 x 30s  spread throughout session; HEP updated with patient and reviewed;   Manual Therapy  IASTM to R dorsal forearm with sweeping and fanning strokes targeting extensor/supinator mm; Supine R radial head AP and PA mobilizations, grade III, 30s/bout x 3 bouts each; Radial head distraction mobilization, grade I-II, 20/bout x 2 bouts; Supine belt assisted medial to lateral forearm mobilizaiton with movement with pt perform gripping 3-5s hold x 10, pt reports resolution of pain; Standing R radial head manipulation by therapist with cavitation;   Trigger Point Dry Needling (TDN), unbilled Education performed with patient regarding potential benefit of TDN. Reviewed precautions and risks with patient. Pt provided verbal consent to treatment. In seated using clean technique and forearm supported, TDN performed to R dorsal forearm extensor group (focus on ECRB) with 2, 0.25 x 40 single needle placements with local twitch response (LTR). One perpendicular placement and one threading placement. Pistoning technique utilized.    PATIENT EDUCATION:  Education details: Tennis elbow, plan of care, updated HEP Person educated: Patient Education method: Explanation, verbal cues, tactile cues, handout Education comprehension: verbalized understanding and returned demonstration   HOME EXERCISE PROGRAM: Access Code: [email or web address] URL: [email or web address] Date: 08/09/2022 Prepared by: Ria CommentJason Falan Hensler  Exercises - Standing Wrist Flexion Stretch (Mirrored)  - 3 x daily - 7 x weekly - 3 reps - 30 hold - Seated Eccentric Wrist Extension (Mirrored)  - 2 x daily - 7 x weekly - 2 sets - 10 reps - 5s lowering hold - Seated Wrist Radial Deviation with Dumbbell  - 2 x daily - 7 x weekly - 2 sets - 10 reps - 5s lowering hold - Scapular Retraction with Resistance  - 1 x daily - 7 x weekly - 3 sets - 10 reps - Scapular Retraction with Resistance Advanced  - 1 x daily - 7 x weekly - 3 sets - 10 reps - Low Trap  Setting at Wall  - 1 x daily - 7 x weekly - 3 sets - 10 reps  Patient Education - Tennis Elbow   ASSESSMENT:  CLINICAL IMPRESSION: Pt arrives complaining of increased R elbow pain so session focused on this issue today. Continued manual techniques including radial head mobilizations and IASTM. Also repeated trigger point dry needling. She reports decrease in pain during mobilization with movement. Updated HEP to include eccentric strengthening. Pt will continue to benefit from skilled PT services to address deficits in pain, shoulder AROM, and muscular weakness to return to PLOF.    REHAB POTENTIAL: Excellent  CLINICAL DECISION MAKING: Stable/uncomplicated  EVALUATION COMPLEXITY: Low   GOALS: Goals reviewed with patient? Yes  SHORT TERM GOALS: Target date: 08/17/2022  Pt will be independent with HEP to improve strength and decrease shoulder pain to improve pain-free function at home, work, and when exercising Baseline:  Goal status: INITIAL   LONG TERM GOALS: Target date: 09/14/2022  Pt will increase FOTO to at least 75 to demonstrate significant improvement in function at home and work related to neck pain  Baseline: 07/20/22: 60 Goal status: INITIAL  2.  Pt will report at least 75% improvement in R shoulder symptoms in order to demonstrate clinically significant reduction in  shoulder pain. Baseline: Goal status: INITIAL  3.  Pt will decrease quick DASH score by at least 8% in order to demonstrate clinically significant reduction in disability related to shoulder pain        Baseline: 07/20/22: To be completed; 10/10: 36.4% Goal status: INITIAL  4. Pt will increase R low trap strength to at least 4/5 in order to demonstrate improvement in strength and function         Baseline: 07/20/22: 3+/5 Goal status: INITIAL   PLAN: PT FREQUENCY: 1-2x/week  PT DURATION: 8 weeks  PLANNED INTERVENTIONS: Therapeutic exercises, Therapeutic activity, Neuromuscular re-education,  Balance training, Gait training, Patient/Family education, Joint manipulation, Joint mobilization, Vestibular training, Canalith repositioning, Aquatic Therapy, Dry Needling, Electrical stimulation, Spinal manipulation, Spinal mobilization, Cryotherapy, Moist heat, Traction, Ultrasound, Ionotophoresis 4mg /ml Dexamethasone, and Manual therapy  PLAN FOR NEXT SESSION: Reassess HEP. Alternative lower trap strengthening exercises and shoulder ER strengthening. Address elbow/shoulder pain.  PT, DPT, GCS  Physical Therapist- [facility]  08/09/2022, 2:01 PM

## 2022-08-11 ENCOUNTER — Ambulatory Visit: Payer: Managed Care, Other (non HMO) | Admitting: Family Medicine

## 2022-08-14 NOTE — Therapy (Incomplete)
OUTPATIENT PHYSICAL THERAPY SHOULDER TREATMENT  Patient Name: Christy Farmer MRN: [id number] DOB:1986/08/25, 36 y.o., female Today's Date: 08/14/2022     Past Medical History:  Diagnosis Date   Abnormal uterine bleeding (AUB)    Allergy    seasonal allergies.   Asthma    Constipation    Enlarged uterus    Gestational diabetes    Heavy periods    Hepatitis A 09/16/2017   History of gestational diabetes 11/26/2019   Hyperlipidemia    Vaginal Pap smear, abnormal    Past Surgical History:  Procedure Laterality Date   ANKLE SURGERY Right 2019   artrhoscopic right ankle surgery - [facility]   COLPOSCOPY     LIPOSUCTION  01/10/2022   laser   Patient Active Problem List   Diagnosis Date Noted   Right lateral epicondylitis 06/30/2022   Mixed hyperlipidemia 02/16/2022   Right supraspinatus tendinitis 02/16/2022   DJD (degenerative joint disease) 02/16/2022   Viral hepatitis A without hepatic coma 10/18/2017   Jaundice 09/16/2017   Annual physical exam 12/01/2015   Asthma 08/13/2015   History of abnormal cervical Pap smear 06/15/2015   Enlarged uterus 06/15/2015   Menorrhagia 06/15/2015   Dysmenorrhea 06/15/2015    PCP: Jerrol Banana, MD  REFERRING PROVIDER: Jerrol Banana, MD  REFERRING DIAGNOSIS:   THERAPY DIAG: Pain in right elbow  Muscle weakness (generalized)  RATIONALE FOR EVALUATION AND TREATMENT: Rehabilitation  ONSET DATE: 06/21/22 (approximate)  FOLLOW UP APPT WITH PROVIDER: Yes, 08/11/22 with Dr. Ashley Royalty  FROM INITIAL EVALUATION SUBJECTIVE:                                                                                                                                                                                         Chief Complaint: R shoulder and R elbow pain;  Pertinent History Pt reports that she has been experiencing R shoulder pain since at least April/May of this year. It started after she began exercising more  frequently and she was doing overhead exercises. She saw her PCP and tried meloxicam without improvement eventually agreeing to a steroid injection in her R shoulder. This has helped considerably with her pain. She was transitioned to diclofenac prn. She still notices painful clicking in the R shoulder with circumduction. She has also been having pain in her lateral R elbow for the last 4 months. She tried a tennis elbow strap with some improvement noted during exercise.   Pain (R shoulder):  Pain Intensity: Present: 0/10, Best: 0/10, Worst: 3/10 Pain location: Posterior R shoulder Pain Quality: sharp and aching  Radiating: Yes, radiates down to the right elbow Numbness/Tingling:  No Focal Weakness: Yes, improved since injection Aggravating factors: R shoulder circumduction, putting away dishes, carry 36 year old, reaching overhead, rolling onto R shoulder Relieving factors: steroid injection, stretches, no help with topical pain rubs 24-hour pain behavior: worse at end of day History of prior shoulder or neck/shoulder injury, pain, surgery, or therapy: No Falls: Has patient fallen in last 6 months? 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 Hobbies: exercise, spending time with the family Red flags: Positive: night sweats, Negative: personal history of cancer, chills/fever, night sweats, nausea, vomiting, unexplained weight gain/loss;  Precautions: None  Weight Bearing Restrictions: No  Living Environment Lives with: lives with their family, spouse, 2 kids, and patient's mother Lives in: House/apartment  Patient Goals: Decrease R shoulder pain so she can return to full pain-free function at home and work as well as with leisure activities such as exercise.  OBJECTIVE:   Patient Surveys  FOTO: 60, predicted improvement to 42 QuickDASH: To be  completed  Cognition Patient is oriented to person, place, and time.  Recent memory is intact.  Remote memory is intact.  Attention span and concentration are intact.  Expressive speech is intact.  Patient's fund of knowledge is within normal limits for educational level.    Gross Musculoskeletal Assessment Tremor: None Bulk: Normal Tone: Normal  Gait Deferred  Posture Grossly WNL however full posture assessment deferred  Cervical Screen AROM: WFL and painless with overpressure in all planes Spurlings A (ipsilateral lateral flexion/axial compression): R: Negative L: Negative Spurlings B (ipsilateral lateral flexion/contralateral rotation/axial compression): R: Negative L: Negative Repeated movement: No centralization or peripheralization with protraction or retraction Hoffman Sign (cervical cord compression): R: Positive L: Positive ULTT Median: R: Negative L: Negative ULTT Ulnar: R: Negative L: Negative ULTT Radial: R: Negative L: Negative  AROM  AROM (Normal range in degrees) AROM 08/14/2022  Cervical  Flexion (50) WNL  Extension (80) WNL  Right lateral flexion (45) WNL  Left lateral flexion (45) WNL  Right rotation (85) WNL  Left rotation (85) Mild limitation   Right Left  Shoulder    Flexion 172* 171  Extension    Abduction 152* 166  External Rotation 97 90  Internal Rotation 72 72  Hands Behind Head T5* T7  Hands Behind Back T9* T3      Elbow    Flexion WNL WNL  Extension WNL WNL  Pronation    Supination    (* = pain; Blank rows = not tested)  LE MMT:  MMT (out of 5) Right 08/14/2022 Left 08/14/2022  Cervical (isometric)  Flexion WNL  Extension WNL  Lateral Flexion WNL WNL  Rotation WNL WNL      Shoulder   Flexion 4+ 5  Extension    Abduction 5 5  External rotation 5 5  Internal rotation 5 5  Horizontal abduction 5 5  Horizontal adduction    Lower Trapezius 3+ 3+  Rhomboids 5 5      Elbow  Flexion 5 5  Extension 5 5  Pronation 5  5  Supination 5* 5      Wrist  Flexion 5* 5  Extension 5 5  Radial deviation    Ulnar deviation        MCP  Flexion 5 5  Extension 5 5  Abduction 5 5  Adduction 5 5  (* =  pain; Blank rows = not tested)  Painful resisted MCP extension digit 3  Sensation Grossly intact to light touch bilateral UE as determined by testing dermatomes C2-T2. Proprioception and hot/cold testing deferred on this date.  Reflexes Deferred  Palpation  Location LEFT  RIGHT           Subocciptials  0  Cervical paraspinals  0  Upper Trapezius  0  Levator Scapulae  0  Rhomboid Major/Minor    Sternoclavicular joint    Acromioclavicular joint    Coracoid process    Long head of biceps    Supraspinatus  1  Infraspinatus  1  Subscapularis    Teres Minor    Teres Major    Pectoralis Major    Pectoralis Minor    Anterior Deltoid  0  Lateral Deltoid  0  Posterior Deltoid  0  Latissimus Dorsi    Sternocleidomastoid    (Blank rows = not tested) Graded on 0-4 scale (0 = no pain, 1 = pain, 2 = pain with wincing/grimacing/flinching, 3 = pain with withdrawal, 4 = unwilling to allow palpation), (Blank rows = not tested)  Repeated Movements Deferred  Passive Accessory Intervertebral Motion Pt denies reproduction of shoulder pain with CPA C2-T7 and UPA bilaterally C2-T7. Grossly normal mobility throughout  Accessory Motions/Glides Deferred  Muscle Length Testing Deferred  SPECIAL TESTS Rotator Cuff  Drop Arm Test: Negative Painful Arc (Pain from 60 to 120 degrees scaption): Negative Infraspinatus Muscle Test: Negative  Subacromial Impingement Hawkins-Kennedy: Positive Neer (Block scapula, PROM flexion): Positive Painful Arc (Pain from 60 to 120 degrees scaption): Negative Empty Can: Positive External Rotation Resistance: Negative Horizontal Adduction: Positive Scapular Assist: Not examined  Labral Tear Biceps Load II (120 elevation, full ER, 90 elbow flexion, full supination, resisted  elbow flexion): Not examined Crank (160 scaption, axial load with IR/ER): Not examined Active Compression Test: Not examined  Bicep Tendon Pathology Speed (shoulder flexion to 90, external rotation, full elbow extension, and forearm supination with resistance: Positive Yergason's (resisted shoulder ER and supination/biceps tendon pathology): Negative  Shoulder Instability Sulcus Sign: Negative Anterior Apprehension: Negative  Beighton scale: Deferred  Limited R elbow assessment (07/25/22)  Pt only has pain with palpation to extensor hood of R extensors. Pain with resisted extension. No pain with passive motions in all planes or other resisted wrist motions. Full mobility noted.   Wrist AROM  (R/L): Equal and full bilat    Grip strength avg of 3 trials:  L: 57.5, 54.7, 55.2 lbs/3 = 55.8 lbs  R: 65.9, 66.3, 66.1 lbs = 66.1 lbs  Healthy females in 3rd decade: 31 Kg on R, 29 Kg on L   QuickDash: 36.4%   TODAY'S TREATMENT    SUBJECTIVE: Pt reports her R shoulder pain continues to improve. However she has been having persistent soreness in her R elbow. She worked out at [employer] yesterday and noted pain with all the exercises requiring active R wrist extension. She has not been consistent with her HEP.   PAIN: R lateral elbow soreness, not rated   Ther-ex  Seated R wrist extension eccentric strengthening 5-6s lowering relax 2 x 10; Seated R wrist radial deviation eccentric strengthening 5-6s lowering relax 2 x 10; Seated straight elbow R wrist flexion stretch (to stretch extensor group) 3 x 30s spread throughout session; HEP updated with patient and reviewed;   Manual Therapy  IASTM to R dorsal forearm with sweeping and fanning strokes targeting extensor/supinator mm; Supine R radial head AP and  PA mobilizations, grade III, 30s/bout x 3 bouts each; Radial head distraction mobilization, grade I-II, 20/bout x 2 bouts; Supine belt assisted medial to lateral forearm mobilizaiton  with movement with pt perform gripping 3-5s hold x 10, pt reports resolution of pain; Standing R radial head manipulation by therapist with cavitation;   Trigger Point Dry Needling (TDN), unbilled Education performed with patient regarding potential benefit of TDN. Reviewed precautions and risks with patient. Pt provided verbal consent to treatment. In seated using clean technique and forearm supported, TDN performed to R dorsal forearm extensor group (focus on ECRB) with 2, 0.25 x 40 single needle placements with local twitch response (LTR). One perpendicular placement and one threading placement. Pistoning technique utilized.    PATIENT EDUCATION:  Education details: Tennis elbow, plan of care, updated HEP Person educated: Patient Education method: Explanation, verbal cues, tactile cues, handout Education comprehension: verbalized understanding and returned demonstration   HOME EXERCISE PROGRAM: Access Code: [email or web address] URL: [email or web address] Date: 08/09/2022 Prepared by: Ria Comment  Exercises - Standing Wrist Flexion Stretch (Mirrored)  - 3 x daily - 7 x weekly - 3 reps - 30 hold - Seated Eccentric Wrist Extension (Mirrored)  - 2 x daily - 7 x weekly - 2 sets - 10 reps - 5s lowering hold - Seated Wrist Radial Deviation with Dumbbell  - 2 x daily - 7 x weekly - 2 sets - 10 reps - 5s lowering hold - Scapular Retraction with Resistance  - 1 x daily - 7 x weekly - 3 sets - 10 reps - Scapular Retraction with Resistance Advanced  - 1 x daily - 7 x weekly - 3 sets - 10 reps - Low Trap Setting at Wall  - 1 x daily - 7 x weekly - 3 sets - 10 reps  Patient Education - Tennis Elbow   ASSESSMENT:  CLINICAL IMPRESSION: Pt arrives complaining of increased R elbow pain so session focused on this issue today. Continued manual techniques including radial head mobilizations and IASTM. Also repeated trigger point dry needling. She reports decrease in pain during mobilization  with movement. Updated HEP to include eccentric strengthening. Pt will continue to benefit from skilled PT services to address deficits in pain, shoulder AROM, and muscular weakness to return to PLOF.    REHAB POTENTIAL: Excellent  CLINICAL DECISION MAKING: Stable/uncomplicated  EVALUATION COMPLEXITY: Low   GOALS: Goals reviewed with patient? Yes  SHORT TERM GOALS: Target date: 08/17/2022  Pt will be independent with HEP to improve strength and decrease shoulder pain to improve pain-free function at home, work, and when exercising Baseline:  Goal status: INITIAL   LONG TERM GOALS: Target date: 09/14/2022  Pt will increase FOTO to at least 75 to demonstrate significant improvement in function at home and work related to neck pain  Baseline: 07/20/22: 60 Goal status: INITIAL  2.  Pt will report at least 75% improvement in R shoulder symptoms in order to demonstrate clinically significant reduction in shoulder pain. Baseline: Goal status: INITIAL  3.  Pt will decrease quick DASH score by at least 8% in order to demonstrate clinically significant reduction in disability related to shoulder pain        Baseline: 07/20/22: To be completed; 10/10: 36.4% Goal status: INITIAL  4. Pt will increase R low trap strength to at least 4/5 in order to demonstrate improvement in strength and function         Baseline: 07/20/22: 3+/5 Goal status: INITIAL   PLAN: PT FREQUENCY: 1-2x/week  PT DURATION: 8 weeks  PLANNED INTERVENTIONS: Therapeutic exercises, Therapeutic activity, Neuromuscular re-education, Balance training, Gait training, Patient/Family education, Joint manipulation, Joint mobilization, Vestibular training, Canalith repositioning, Aquatic Therapy, Dry Needling, Electrical stimulation, Spinal manipulation, Spinal mobilization, Cryotherapy, Moist heat, Traction, Ultrasound, Ionotophoresis 4mg /ml Dexamethasone, and Manual therapy  PLAN FOR NEXT SESSION: Reassess HEP. Alternative  lower trap strengthening exercises and shoulder ER strengthening. Address elbow/shoulder pain.  Lynnea MaizesJason D Jkayla Spiewak PT, DPT, GCS  Physical Therapist- [facility]  [facility]  08/14/2022, 1:38 PM

## 2022-08-16 DIAGNOSIS — M25521 Pain in right elbow: Secondary | ICD-10-CM

## 2022-08-16 DIAGNOSIS — M6281 Muscle weakness (generalized): Secondary | ICD-10-CM

## 2022-08-17 ENCOUNTER — Inpatient Hospital Stay (INDEPENDENT_AMBULATORY_CARE_PROVIDER_SITE_OTHER): Payer: Managed Care, Other (non HMO) | Admitting: Radiology

## 2022-08-17 ENCOUNTER — Ambulatory Visit (INDEPENDENT_AMBULATORY_CARE_PROVIDER_SITE_OTHER): Payer: Managed Care, Other (non HMO) | Admitting: Family Medicine

## 2022-08-17 ENCOUNTER — Encounter: Payer: Self-pay | Admitting: Family Medicine

## 2022-08-17 VITALS — BP 120/76 | HR 76 | Ht 64.0 in | Wt 155.0 lb

## 2022-08-17 DIAGNOSIS — M7591 Shoulder lesion, unspecified, right shoulder: Secondary | ICD-10-CM

## 2022-08-17 DIAGNOSIS — M7711 Lateral epicondylitis, right elbow: Secondary | ICD-10-CM

## 2022-08-17 MED ORDER — TRIAMCINOLONE ACETONIDE 40 MG/ML IJ SUSP
40.0000 mg | Freq: Once | INTRAMUSCULAR | Status: AC
  Administered 2022-08-17: 40 mg via INTRAMUSCULAR

## 2022-08-17 MED ORDER — DICLOFENAC SODIUM 75 MG PO TBEC: 75.0000 mg | DELAYED_RELEASE_TABLET | Freq: Two times a day (BID) | ORAL | 0 refills | Status: DC | PRN

## 2022-08-17 NOTE — Patient Instructions (Signed)
You have just been given a cortisone injection to reduce pain and inflammation. After the injection you may notice immediate relief of pain as a result of the Lidocaine. It is important to rest the area of the injection for 24 to 48 hours after the injection. There is a possibility of some temporary increased discomfort and swelling for up to 72 hours until the cortisone begins to work. If you do have pain, simply rest the joint and use ice. If you can tolerate over the counter medications, you can try Tylenol, Aleve, or Advil for added relief per package instructions. - As above, relative rest x 2 days then as-needed - Continue PT and home exercises - Use diclofenac as-needed - Contact in 6 weeks if still symptomatic

## 2022-08-17 NOTE — Progress Notes (Signed)
Primary Care / Sports Medicine Office Visit  Patient Information:  Patient ID: Christy Farmer, female DOB: 1985-11-18 Age: 36 y.o. MRN: [id number]   Christy Farmer is a pleasant 36 y.o. female presenting with the following:  Chief Complaint  Patient presents with   Right supraspinatus tendinitis    Pt states it for 1 month but feels the same now   skin issue    Red spot on right side of head in hairline     Vitals:   08/17/22 1053  BP: 120/76  Pulse: 76  SpO2: 98%   Vitals:   08/17/22 1053  Weight: 155 lb (70.3 kg)  Height: 5\' 4"  (1.626 m)   Body mass index is 26.61 kg/m.  No results found.   Independent interpretation of notes and tests performed by another provider:   None  Procedures performed:   Procedure:  Injection of right lateral epicondyle under ultrasound guidance. Ultrasound guidance utilized for out of plane approach to right lateral epicondyle, no sonographic evidence of tendinopathy visualized. Samsung HS60 device utilized with permanent recording / reporting. Verbal informed consent obtained and verified. Skin prepped in a sterile fashion. Ethyl chloride for topical local analgesia.  Completed without difficulty and tolerated well. Medication: triamcinolone acetonide 40 mg/mL suspension for injection 1 mL total and 2 mL lidocaine 1% without epinephrine utilized for needle placement anesthetic Advised to contact for fevers/chills, erythema, induration, drainage, or persistent bleeding.   Pertinent History, Exam, Impression, and Recommendations:   Problem List Items Addressed This Visit       Musculoskeletal and Integument   Right supraspinatus tendinitis    Returns for follow-up to chronic right shoulder pain, at last visit on 06/30/2022 she received ultrasound guided subacromial cortisone injection which resulted in essential symptom resolution. She has started physical therapy, not requiring diclofenac for this issue.  Examination  does show 5/5 strength throughout the rotator cuff, mild discomfort with isolated supersized testing, negative provocative tests.  I have encouraged the patient to continue with physical therapy, utilize diclofenac as needed, and can follow-up on as-needed basis for this chronic now stable condition.       Relevant Medications   diclofenac (VOLTAREN) 75 MG EC tablet   Right lateral epicondylitis - Primary    Patient also returns for right lateral elbow pain in the setting of chronic right shoulder pain, now improved, at last visit on 06/30/2022, she was advised scheduled diclofenac x1 week then as needed, PT.  Despite these measures she still notes right elbow pain interfering with ADLs, examination is consistent with lateral epicondylitis.  Did review treatment strategies and she did elect to proceed with ultrasound-guided right lateral epicondylitis injection.  She is to continue with physical therapy and contact 07/02/2022 in 6 weeks for any recalcitrant symptoms, advanced imaging to be considered at that time as clinically guided.      Relevant Medications   diclofenac (VOLTAREN) 75 MG EC tablet   Other Relevant Orders   [place] LIMITED JOINT SPACE STRUCTURES UP RIGHT     Orders & Medications Meds ordered this encounter  Medications   DISCONTD: diclofenac (VOLTAREN) 75 MG EC tablet    Sig: Take 1 tablet (75 mg total) by mouth 2 (two) times daily as needed.    Dispense:  60 tablet    Refill:  0   diclofenac (VOLTAREN) 75 MG EC tablet    Sig: Take 1 tablet (75 mg total) by mouth 2 (two) times daily as needed.  Dispense:  60 tablet    Refill:  0   triamcinolone acetonide (KENALOG-40) injection 40 mg   Orders Placed This Encounter  Procedures   [place] LIMITED JOINT SPACE STRUCTURES UP RIGHT     Return if symptoms worsen or fail to improve.     Montel Culver, MD   Primary Care Sports Medicine [place]

## 2022-08-17 NOTE — Assessment & Plan Note (Addendum)
Patient also returns for right lateral elbow pain in the setting of chronic right shoulder pain, now improved, at last visit on 06/30/2022, she was advised scheduled diclofenac x1 week then as needed, PT.  Despite these measures she still notes right elbow pain interfering with ADLs, examination is consistent with lateral epicondylitis.  Did review treatment strategies and she did elect to proceed with ultrasound-guided right lateral epicondylitis injection.  She is to continue with physical therapy and contact us in 6 weeks for any recalcitrant symptoms, advanced imaging to be considered at that time as clinically guided.

## 2022-08-17 NOTE — Assessment & Plan Note (Signed)
Returns for follow-up to chronic right shoulder pain, at last visit on 06/30/2022 she received ultrasound guided subacromial cortisone injection which resulted in essential symptom resolution. She has started physical therapy, not requiring diclofenac for this issue.  Examination does show 5/5 strength throughout the rotator cuff, mild discomfort with isolated supersized testing, negative provocative tests.  I have encouraged the patient to continue with physical therapy, utilize diclofenac as needed, and can follow-up on as-needed basis for this chronic now stable condition.

## 2022-08-22 NOTE — Therapy (Incomplete)
OUTPATIENT PHYSICAL THERAPY SHOULDER TREATMENT  Patient Name: Christy Farmer MRN: [id number] DOB:November 06, 1985, 36 y.o., female Today's Date: 08/22/2022     Past Medical History:  Diagnosis Date   Abnormal uterine bleeding (AUB)    Allergy    seasonal allergies.   Asthma    Constipation    Enlarged uterus    Gestational diabetes    Heavy periods    Hepatitis A 09/16/2017   History of gestational diabetes 11/26/2019   Hyperlipidemia    Vaginal Pap smear, abnormal    Past Surgical History:  Procedure Laterality Date   ANKLE SURGERY Right 2019   artrhoscopic right ankle surgery - [facility]   COLPOSCOPY     LIPOSUCTION  01/10/2022   laser   Patient Active Problem List   Diagnosis Date Noted   Right lateral epicondylitis 06/30/2022   Mixed hyperlipidemia 02/16/2022   Right supraspinatus tendinitis 02/16/2022   DJD (degenerative joint disease) 02/16/2022   Viral hepatitis A without hepatic coma 10/18/2017   Jaundice 09/16/2017   Annual physical exam 12/01/2015   Asthma 08/13/2015   History of abnormal cervical Pap smear 06/15/2015   Enlarged uterus 06/15/2015   Menorrhagia 06/15/2015   Dysmenorrhea 06/15/2015    PCP: Montel Culver, MD  REFERRING PROVIDER: Montel Culver, MD  REFERRING DIAGNOSIS:   THERAPY DIAG: Pain in right elbow  Stiffness of right shoulder, not elsewhere classified  RATIONALE FOR EVALUATION AND TREATMENT: Rehabilitation  ONSET DATE: 06/21/22 (approximate)  FOLLOW UP APPT WITH PROVIDER: Yes, 08/11/22 with Dr. Zigmund Daniel  FROM INITIAL EVALUATION SUBJECTIVE:                                                                                                                                                                                         Chief Complaint: R shoulder and R elbow pain;  Pertinent History Pt reports that she has been experiencing R shoulder pain since at least April/May of this year. It started after she  began exercising more frequently and she was doing overhead exercises. She saw her PCP and tried meloxicam without improvement eventually agreeing to a steroid injection in her R shoulder. This has helped considerably with her pain. She was transitioned to diclofenac prn. She still notices painful clicking in the R shoulder with circumduction. She has also been having pain in her lateral R elbow for the last 4 months. She tried a tennis elbow strap with some improvement noted during exercise.   Pain (R shoulder):  Pain Intensity: Present: 0/10, Best: 0/10, Worst: 3/10 Pain location: Posterior R shoulder Pain Quality: sharp and aching  Radiating: Yes, radiates down to  the right elbow Numbness/Tingling: No Focal Weakness: Yes, improved since injection Aggravating factors: R shoulder circumduction, putting away dishes, carry 36 year old, reaching overhead, rolling onto R shoulder Relieving factors: steroid injection, stretches, no help with topical pain rubs 24-hour pain behavior: worse at end of day History of prior shoulder or neck/shoulder injury, pain, surgery, or therapy: No Falls: Has patient fallen in last 6 months? 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 Hobbies: exercise, spending time with the family Red flags: Positive: night sweats, Negative: personal history of cancer, chills/fever, night sweats, nausea, vomiting, unexplained weight gain/loss;  Precautions: None  Weight Bearing Restrictions: No  Living Environment Lives with: lives with their family, spouse, 2 kids, and patient's mother Lives in: House/apartment  Patient Goals: Decrease R shoulder pain so she can return to full pain-free function at home and work as well as with leisure activities such as exercise.  OBJECTIVE:   Patient Surveys  FOTO: 60, predicted improvement to  11 QuickDASH: To be completed  Cognition Patient is oriented to person, place, and time.  Recent memory is intact.  Remote memory is intact.  Attention span and concentration are intact.  Expressive speech is intact.  Patient's fund of knowledge is within normal limits for educational level.    Gross Musculoskeletal Assessment Tremor: None Bulk: Normal Tone: Normal  Gait Deferred  Posture Grossly WNL however full posture assessment deferred  Cervical Screen AROM: WFL and painless with overpressure in all planes Spurlings A (ipsilateral lateral flexion/axial compression): R: Negative L: Negative Spurlings B (ipsilateral lateral flexion/contralateral rotation/axial compression): R: Negative L: Negative Repeated movement: No centralization or peripheralization with protraction or retraction Hoffman Sign (cervical cord compression): R: Positive L: Positive ULTT Median: R: Negative L: Negative ULTT Ulnar: R: Negative L: Negative ULTT Radial: R: Negative L: Negative  AROM  AROM (Normal range in degrees) AROM 08/22/2022  Cervical  Flexion (50) WNL  Extension (80) WNL  Right lateral flexion (45) WNL  Left lateral flexion (45) WNL  Right rotation (85) WNL  Left rotation (85) Mild limitation   Right Left  Shoulder    Flexion 172* 171  Extension    Abduction 152* 166  External Rotation 97 90  Internal Rotation 72 72  Hands Behind Head T5* T7  Hands Behind Back T9* T3      Elbow    Flexion WNL WNL  Extension WNL WNL  Pronation    Supination    (* = pain; Blank rows = not tested)  LE MMT:  MMT (out of 5) Right 08/22/2022 Left 08/22/2022  Cervical (isometric)  Flexion WNL  Extension WNL  Lateral Flexion WNL WNL  Rotation WNL WNL      Shoulder   Flexion 4+ 5  Extension    Abduction 5 5  External rotation 5 5  Internal rotation 5 5  Horizontal abduction 5 5  Horizontal adduction    Lower Trapezius 3+ 3+  Rhomboids 5 5      Elbow  Flexion 5 5  Extension  5 5  Pronation 5 5  Supination 5* 5      Wrist  Flexion 5* 5  Extension 5 5  Radial deviation    Ulnar deviation        MCP  Flexion 5 5  Extension 5 5  Abduction 5 5  Adduction 5  5  (* = pain; Blank rows = not tested)  Painful resisted MCP extension digit 3  Sensation Grossly intact to light touch bilateral UE as determined by testing dermatomes C2-T2. Proprioception and hot/cold testing deferred on this date.  Reflexes Deferred  Palpation  Location LEFT  RIGHT           Subocciptials  0  Cervical paraspinals  0  Upper Trapezius  0  Levator Scapulae  0  Rhomboid Major/Minor    Sternoclavicular joint    Acromioclavicular joint    Coracoid process    Long head of biceps    Supraspinatus  1  Infraspinatus  1  Subscapularis    Teres Minor    Teres Major    Pectoralis Major    Pectoralis Minor    Anterior Deltoid  0  Lateral Deltoid  0  Posterior Deltoid  0  Latissimus Dorsi    Sternocleidomastoid    (Blank rows = not tested) Graded on 0-4 scale (0 = no pain, 1 = pain, 2 = pain with wincing/grimacing/flinching, 3 = pain with withdrawal, 4 = unwilling to allow palpation), (Blank rows = not tested)  Repeated Movements Deferred  Passive Accessory Intervertebral Motion Pt denies reproduction of shoulder pain with CPA C2-T7 and UPA bilaterally C2-T7. Grossly normal mobility throughout  Accessory Motions/Glides Deferred  Muscle Length Testing Deferred  SPECIAL TESTS Rotator Cuff  Drop Arm Test: Negative Painful Arc (Pain from 60 to 120 degrees scaption): Negative Infraspinatus Muscle Test: Negative  Subacromial Impingement Hawkins-Kennedy: Positive Neer (Block scapula, PROM flexion): Positive Painful Arc (Pain from 60 to 120 degrees scaption): Negative Empty Can: Positive External Rotation Resistance: Negative Horizontal Adduction: Positive Scapular Assist: Not examined  Labral Tear Biceps Load II (120 elevation, full ER, 90 elbow flexion, full  supination, resisted elbow flexion): Not examined Crank (160 scaption, axial load with IR/ER): Not examined Active Compression Test: Not examined  Bicep Tendon Pathology Speed (shoulder flexion to 90, external rotation, full elbow extension, and forearm supination with resistance: Positive Yergason's (resisted shoulder ER and supination/biceps tendon pathology): Negative  Shoulder Instability Sulcus Sign: Negative Anterior Apprehension: Negative  Beighton scale: Deferred  Limited R elbow assessment (07/25/22)  Pt only has pain with palpation to extensor hood of R extensors. Pain with resisted extension. No pain with passive motions in all planes or other resisted wrist motions. Full mobility noted.   Wrist AROM  (R/L): Equal and full bilat    Grip strength avg of 3 trials:  L: 57.5, 54.7, 55.2 lbs/3 = 55.8 lbs  R: 65.9, 66.3, 66.1 lbs = 66.1 lbs  Healthy females in 3rd decade: 31 Kg on R, 29 Kg on L   QuickDash: 36.4%   TODAY'S TREATMENT    SUBJECTIVE: Pt reports her R shoulder pain continues to improve. However she has been having persistent soreness in her R elbow. She worked out at [employer] yesterday and noted pain with all the exercises requiring active R wrist extension. She has not been consistent with her HEP.   PAIN: R lateral elbow soreness, not rated   Ther-ex  Seated R wrist extension eccentric strengthening 5-6s lowering relax 2 x 10; Seated R wrist radial deviation eccentric strengthening 5-6s lowering relax 2 x 10; Seated straight elbow R wrist flexion stretch (to stretch extensor group) 3 x 30s spread throughout session; HEP updated with patient and reviewed;   Manual Therapy  IASTM to R dorsal forearm with sweeping and fanning strokes targeting extensor/supinator mm; Supine R  radial head AP and PA mobilizations, grade III, 30s/bout x 3 bouts each; Radial head distraction mobilization, grade I-II, 20/bout x 2 bouts; Supine belt assisted medial to lateral  forearm mobilizaiton with movement with pt perform gripping 3-5s hold x 10, pt reports resolution of pain; Standing R radial head manipulation by therapist with cavitation;   Trigger Point Dry Needling (TDN), unbilled Education performed with patient regarding potential benefit of TDN. Reviewed precautions and risks with patient. Pt provided verbal consent to treatment. In seated using clean technique and forearm supported, TDN performed to R dorsal forearm extensor group (focus on ECRB) with 2, 0.25 x 40 single needle placements with local twitch response (LTR). One perpendicular placement and one threading placement. Pistoning technique utilized.    PATIENT EDUCATION:  Education details: Tennis elbow, plan of care, updated HEP Person educated: Patient Education method: Explanation, verbal cues, tactile cues, handout Education comprehension: verbalized understanding and returned demonstration   HOME EXERCISE PROGRAM: Access Code: [email or web address] URL: [email or web address] Date: 08/09/2022 Prepared by: Ria Comment  Exercises - Standing Wrist Flexion Stretch (Mirrored)  - 3 x daily - 7 x weekly - 3 reps - 30 hold - Seated Eccentric Wrist Extension (Mirrored)  - 2 x daily - 7 x weekly - 2 sets - 10 reps - 5s lowering hold - Seated Wrist Radial Deviation with Dumbbell  - 2 x daily - 7 x weekly - 2 sets - 10 reps - 5s lowering hold - Scapular Retraction with Resistance  - 1 x daily - 7 x weekly - 3 sets - 10 reps - Scapular Retraction with Resistance Advanced  - 1 x daily - 7 x weekly - 3 sets - 10 reps - Low Trap Setting at Wall  - 1 x daily - 7 x weekly - 3 sets - 10 reps  Patient Education - Tennis Elbow   ASSESSMENT:  CLINICAL IMPRESSION: Pt arrives complaining of increased R elbow pain so session focused on this issue today. Continued manual techniques including radial head mobilizations and IASTM. Also repeated trigger point dry needling. She reports decrease in pain  during mobilization with movement. Updated HEP to include eccentric strengthening. Pt will continue to benefit from skilled PT services to address deficits in pain, shoulder AROM, and muscular weakness to return to PLOF.    REHAB POTENTIAL: Excellent  CLINICAL DECISION MAKING: Stable/uncomplicated  EVALUATION COMPLEXITY: Low   GOALS: Goals reviewed with patient? Yes  SHORT TERM GOALS: Target date: 08/17/2022  Pt will be independent with HEP to improve strength and decrease shoulder pain to improve pain-free function at home, work, and when exercising Baseline:  Goal status: INITIAL   LONG TERM GOALS: Target date: 09/14/2022  Pt will increase FOTO to at least 75 to demonstrate significant improvement in function at home and work related to neck pain  Baseline: 07/20/22: 60 Goal status: INITIAL  2.  Pt will report at least 75% improvement in R shoulder symptoms in order to demonstrate clinically significant reduction in shoulder pain. Baseline: Goal status: INITIAL  3.  Pt will decrease quick DASH score by at least 8% in order to demonstrate clinically significant reduction in disability related to shoulder pain        Baseline: 07/20/22: To be completed; 10/10: 36.4% Goal status: INITIAL  4. Pt will increase R low trap strength to at least 4/5 in order to demonstrate improvement in strength and function         Baseline: 07/20/22: 3+/5 Goal status: INITIAL   PLAN:  PT FREQUENCY: 1-2x/week  PT DURATION: 8 weeks  PLANNED INTERVENTIONS: Therapeutic exercises, Therapeutic activity, Neuromuscular re-education, Balance training, Gait training, Patient/Family education, Joint manipulation, Joint mobilization, Vestibular training, Canalith repositioning, Aquatic Therapy, Dry Needling, Electrical stimulation, Spinal manipulation, Spinal mobilization, Cryotherapy, Moist heat, Traction, Ultrasound, Ionotophoresis 4mg /ml Dexamethasone, and Manual therapy  PLAN FOR NEXT SESSION: Reassess  HEP. Alternative lower trap strengthening exercises and shoulder ER strengthening. Address elbow/shoulder pain.  Lynnea MaizesJason D Raymundo Rout PT, DPT, GCS  Physical Therapist- [facility]  [facility]  08/22/2022, 9:16 PM

## 2022-08-23 ENCOUNTER — Ambulatory Visit: Payer: Managed Care, Other (non HMO) | Attending: Family Medicine

## 2022-08-23 DIAGNOSIS — M25521 Pain in right elbow: Secondary | ICD-10-CM | POA: Insufficient documentation

## 2022-08-23 DIAGNOSIS — M25611 Stiffness of right shoulder, not elsewhere classified: Secondary | ICD-10-CM | POA: Insufficient documentation

## 2022-09-18 ENCOUNTER — Ambulatory Visit
Admission: RE | Admit: 2022-09-18 | Discharge: 2022-09-18 | Disposition: A | Discharge status: Home / Self Care | Payer: Managed Care, Other (non HMO) | Source: Ambulatory Visit | Attending: Urgent Care | Admitting: Urgent Care

## 2022-09-18 VITALS — BP 120/84 | HR 79 | Temp 97.9°F | Resp 18

## 2022-09-18 DIAGNOSIS — Z1152 Encounter for screening for COVID-19: Secondary | ICD-10-CM | POA: Diagnosis not present

## 2022-09-18 DIAGNOSIS — J22 Unspecified acute lower respiratory infection: Secondary | ICD-10-CM | POA: Diagnosis present

## 2022-09-18 DIAGNOSIS — J209 Acute bronchitis, unspecified: Secondary | ICD-10-CM | POA: Diagnosis present

## 2022-09-18 DIAGNOSIS — Z792 Long term (current) use of antibiotics: Secondary | ICD-10-CM | POA: Diagnosis not present

## 2022-09-18 LAB — RESP PANEL BY RT-PCR (RSV, FLU A&B, COVID)  RVPGX2
Influenza A by PCR: NEGATIVE
Influenza B by PCR: NEGATIVE
Resp Syncytial Virus by PCR: NEGATIVE
SARS Coronavirus 2 by RT PCR: NEGATIVE

## 2022-09-18 LAB — POCT RAPID STREP A (OFFICE): Rapid Strep A Screen: POSITIVE — AB

## 2022-09-18 MED ORDER — AZITHROMYCIN 250 MG PO TABS: ORAL_TABLET | ORAL | 0 refills | Status: DC

## 2022-09-18 MED ORDER — PREDNISONE 20 MG PO TABS: ORAL_TABLET | ORAL | 0 refills | Status: AC

## 2022-09-18 NOTE — ED Triage Notes (Signed)
Pt. Presents to UC w/ c/o a sore throat and congestion for the past week.

## 2022-09-18 NOTE — ED Provider Notes (Signed)
Christy Farmer    CSN: [id number] Arrival date & time: 09/18/22  1837      History   Chief Complaint Chief Complaint  Patient presents with   Sore Throat    Entered by patient    HPI Christy Farmer is a 36 y.o. female.    Sore Throat    Presents to urgent care with complaint of sore throat and congestion x 1 week.  She endorses feeling very achy in the last few days and states she was with her nephew who was just diagnosed with influenza.  Patient was also seen in October 2023 for acute bacterial sinusitis after a trip to the [place].  She was treated at that time with Augmentin.  She states her symptoms got better except for the cough.  Past Medical History:  Diagnosis Date   Abnormal uterine bleeding (AUB)    Allergy    seasonal allergies.   Asthma    Constipation    Enlarged uterus    Gestational diabetes    Heavy periods    Hepatitis A 09/16/2017   History of gestational diabetes 11/26/2019   Hyperlipidemia    Vaginal Pap smear, abnormal     Patient Active Problem List   Diagnosis Date Noted   Right lateral epicondylitis 06/30/2022   Mixed hyperlipidemia 02/16/2022   Right supraspinatus tendinitis 02/16/2022   DJD (degenerative joint disease) 02/16/2022   Viral hepatitis A without hepatic coma 10/18/2017   Jaundice 09/16/2017   Annual physical exam 12/01/2015   Asthma 08/13/2015   History of abnormal cervical Pap smear 06/15/2015   Enlarged uterus 06/15/2015   Menorrhagia 06/15/2015   Dysmenorrhea 06/15/2015    Past Surgical History:  Procedure Laterality Date   ANKLE SURGERY Right 2019   artrhoscopic right ankle surgery - [facility]   COLPOSCOPY     LIPOSUCTION  01/10/2022   laser    OB History     Gravida  2   Para  2   Term  2   Preterm      AB      Living  2      SAB      IAB      Ectopic      Multiple  0   Live Births  2            Home Medications    Prior to Admission  medications   Medication Sig Start Date End Date Taking? Authorizing Provider  acetaminophen (TYLENOL) 500 MG tablet Take 500 mg by mouth every 6 (six) hours as needed.    [provider]  albuterol (VENTOLIN HFA) 108 (90 Base) MCG/ACT inhaler Inhale 1-2 puffs into the lungs every 6 (six) hours as needed for wheezing or shortness of breath. 08/02/22 07/28/23  Donika Butner, Jeannett Senior, FNP  BIOTIN PO Take by mouth.    [provider]  clobetasol (TEMOVATE) 0.05 % external solution Apply 1 application. topically 2 (two) times daily. 12/22/21   [provider]  Clobetasol Propionate 0.05 % shampoo Apply 1 application. topically 3 (three) times a week. 12/22/21   [provider]  COLLAGEN PO Take by mouth.    [provider]  diclofenac (VOLTAREN) 75 MG EC tablet Take 1 tablet (75 mg total) by mouth 2 (two) times daily as needed. 08/17/22   Jerrol Banana, MD  etonogestrel-ethinyl estradiol (NUVARING) 0.12-0.015 MG/24HR vaginal ring Insert vaginally and leave in place for 3 consecutive weeks, then remove  for 1 week. 06/27/22   Hildred Laser, MD  Turmeric (QC TUMERIC COMPLEX PO) Take by mouth.    [provider]  VITAMIN D PO Take by mouth.    [provider]    Family History Family History  Problem Relation Age of Onset   Hyperlipidemia Mother    Hypertension Mother    Healthy Father    Hypertension Brother    Hyperlipidemia Brother    Heart attack Maternal Grandmother    Cancer Neg Hx    Diabetes Neg Hx    Heart disease Neg Hx     Social History Social History   Tobacco Use   Smoking status: Never   Smokeless tobacco: Never  Vaping Use   Vaping Use: Never used  Substance Use Topics   Alcohol use: Yes    Alcohol/week: 2.0 standard drinks of alcohol    Types: 2 Glasses of wine per week   Drug use: No     Allergies   Patient has no known allergies.   Review of Systems Review of Systems   Physical Exam Triage Vital  Signs ED Triage Vitals [09/18/22 1848]  Enc Vitals Group     BP      Pulse      Resp      Temp      Temp src      SpO2      Weight      Height      Head Circumference      Peak Flow      Pain Score 8     Pain Loc      Pain Edu?      Excl. in GC?    No data found.  Updated Vital Signs LMP 09/17/2022 (Approximate)   Breastfeeding No   Visual Acuity Right Eye Distance:   Left Eye Distance:   Bilateral Distance:    Right Eye Near:   Left Eye Near:    Bilateral Near:     Physical Exam Vitals reviewed.  Constitutional:      Appearance: She is well-developed.  HENT:     Head: Normocephalic and atraumatic.     Mouth/Throat:     Pharynx: Posterior oropharyngeal erythema present. No oropharyngeal exudate.     Tonsils: No tonsillar exudate.  Cardiovascular:     Rate and Rhythm: Normal rate and regular rhythm.     Heart sounds: Normal heart sounds.  Pulmonary:     Effort: Pulmonary effort is normal.     Breath sounds: Normal breath sounds.  Skin:    General: Skin is warm and dry.  Neurological:     General: No focal deficit present.     Mental Status: She is alert and oriented to person, place, and time.  Psychiatric:        Mood and Affect: Mood normal.        Behavior: Behavior normal.      UC Treatments / Results  Labs (all labs ordered are listed, but only abnormal results are displayed) Labs Reviewed  POCT RAPID STREP A (OFFICE)    EKG   Radiology No results found.  Procedures Procedures (including critical care time)  Medications Ordered in UC Medications - No data to display  Initial Impression / Assessment and Plan / UC Course  I have reviewed the triage vital signs and the nursing notes.  Pertinent labs & imaging results that were available during my care of the patient were reviewed by me and  considered in my medical decision making (see chart for details).   Patient is afebrile here without recent antipyretics. Satting well on room  air. Overall is well appearing, well hydrated, without respiratory distress. Pulmonary exam is unremarkable.  Mild pharyngeal erythema without presence of peritonsillar exudates.  She endorses some recent body aches and so we will test for influenza and COVID as well.  Rapid strep is negative.  Results of swab pending.  Continue to suspect viral etiology for her symptoms. However given duration of symptoms will cover for secondary bacterial with z-pak. Treating bronchitits with prednisone.    Final Clinical Impressions(s) / UC Diagnoses   Final diagnoses:  None   Discharge Instructions   None    ED Prescriptions   None    PDMP not reviewed this encounter.   Charma Igo, [place] 09/18/22 1913

## 2022-09-18 NOTE — Discharge Instructions (Signed)
Follow up here or with your primary care provider if your symptoms are worsening or not improving with treatment.     

## 2022-09-19 ENCOUNTER — Other Ambulatory Visit: Payer: Self-pay

## 2022-09-19 ENCOUNTER — Other Ambulatory Visit: Payer: Self-pay | Admitting: Family Medicine

## 2022-09-19 DIAGNOSIS — M7711 Lateral epicondylitis, right elbow: Secondary | ICD-10-CM

## 2022-09-19 DIAGNOSIS — M7591 Shoulder lesion, unspecified, right shoulder: Secondary | ICD-10-CM

## 2022-09-19 NOTE — Telephone Encounter (Signed)
Requested Prescriptions  Pending Prescriptions Disp Refills   diclofenac (VOLTAREN) 75 MG EC tablet [Pharmacy Med Name: DICLOFENAC SOD EC 75 MG TAB] 60 tablet 0    Sig: TAKE 1 TABLET BY MOUTH 2 TIMES DAILY AS NEEDED.     Analgesics:  NSAIDS Failed - 09/19/2022  1:06 AM      Failed - Manual Review: Labs are only required if the patient has taken medication for more than 8 weeks.      Passed - Cr in normal range and within 360 days    Creat  Date Value Ref Range Status  11/21/2016 0.63 0.50 - 1.10 mg/dL Final   Creatinine, Ser  Date Value Ref Range Status  02/17/2022 0.65 0.57 - 1.00 mg/dL Final         Passed - HGB in normal range and within 360 days    Hemoglobin  Date Value Ref Range Status  02/17/2022 13.7 11.1 - 15.9 g/dL Final         Passed - PLT in normal range and within 360 days    Platelets  Date Value Ref Range Status  02/17/2022 315 150 - 450 x10E3/uL Final         Passed - HCT in normal range and within 360 days    Hematocrit  Date Value Ref Range Status  02/17/2022 40.7 34.0 - 46.6 % Final         Passed - eGFR is 30 or above and within 360 days    GFR, Est African American  Date Value Ref Range Status  11/21/2016 >89 >=60 mL/min Final   GFR calc Af Amer  Date Value Ref Range Status  01/26/2020 >60 >60 mL/min Final   GFR, Est Non African American  Date Value Ref Range Status  11/21/2016 >89 >=60 mL/min Final   GFR calc non Af Amer  Date Value Ref Range Status  01/26/2020 >60 >60 mL/min Final   eGFR  Date Value Ref Range Status  02/17/2022 117 >59 mL/min/1.73 Final         Passed - Patient is not pregnant      Passed - Valid encounter within last 12 months    Recent Outpatient Visits           1 month ago Right lateral epicondylitis   [facility] [place], Earley Abide, MD   2 months ago Right supraspinatus tendinitis   [facility] [place], Earley Abide, MD   7 months ago Annual physical exam    [facility], Earley Abide, MD   3 years ago Encounter for surveillance of abnormal nevi   [place], FNP   3 years ago Mild intermittent asthma without complication   [place], [place], FNP       Future Appointments             In 5 months Zigmund Daniel, Earley Abide, MD [facility] at [facility], [facility]

## 2022-10-15 ENCOUNTER — Encounter: Payer: Self-pay | Admitting: Family Medicine

## 2022-10-17 NOTE — Telephone Encounter (Signed)
Please advise 

## 2022-11-02 ENCOUNTER — Ambulatory Visit: Payer: Managed Care, Other (non HMO) | Admitting: Family Medicine

## 2023-02-26 ENCOUNTER — Encounter: Payer: Managed Care, Other (non HMO) | Admitting: Family Medicine

## 2023-03-02 ENCOUNTER — Encounter: Payer: Self-pay | Admitting: Family Medicine

## 2023-03-02 ENCOUNTER — Ambulatory Visit (INDEPENDENT_AMBULATORY_CARE_PROVIDER_SITE_OTHER): Payer: 59 | Admitting: Family Medicine

## 2023-03-02 VITALS — BP 124/80 | HR 76 | Ht 64.0 in | Wt 154.0 lb

## 2023-03-02 DIAGNOSIS — E782 Mixed hyperlipidemia: Secondary | ICD-10-CM

## 2023-03-02 DIAGNOSIS — E559 Vitamin D deficiency, unspecified: Secondary | ICD-10-CM

## 2023-03-02 DIAGNOSIS — M19271 Secondary osteoarthritis, right ankle and foot: Secondary | ICD-10-CM

## 2023-03-02 DIAGNOSIS — M7591 Shoulder lesion, unspecified, right shoulder: Secondary | ICD-10-CM | POA: Diagnosis not present

## 2023-03-02 DIAGNOSIS — L989 Disorder of the skin and subcutaneous tissue, unspecified: Secondary | ICD-10-CM | POA: Insufficient documentation

## 2023-03-02 DIAGNOSIS — Z0000 Encounter for general adult medical examination without abnormal findings: Secondary | ICD-10-CM

## 2023-03-02 NOTE — Patient Instructions (Addendum)
-   Obtain fasting labs with orders provided (can have water or black coffee but otherwise no food or drink x 8 hours before labs) °- Review information provided °- Attend eye doctor annually, dentist every 6 months, work towards or maintain 30 minutes of moderate intensity physical activity at least 5 days per week, and consume a balanced diet °- Return in 1 year for physical °- Contact us for any questions between now and then °

## 2023-03-16 NOTE — Assessment & Plan Note (Signed)
Annual examination completed, risk stratification labs ordered, anticipatory guidance provided.  We will follow labs once resulted. 

## 2023-03-16 NOTE — Progress Notes (Signed)
Annual Physical Exam Visit  Patient Information:  Patient ID: Christy Farmer, female DOB: 1986/05/16 Age: 37 y.o. MRN: [id number]   Subjective:   CC: Annual Physical Exam  HPI:  Christy Farmer is here for their annual physical.  I reviewed the past medical history, family history, social history, surgical history, and allergies today and changes were made as necessary.  Please see the problem list section below for additional details.  Past Medical History: Past Medical History:  Diagnosis Date   Abnormal uterine bleeding (AUB)    Allergy    seasonal allergies.   Asthma    Constipation    Enlarged uterus    Gestational diabetes    Heavy periods    Hepatitis A 09/16/2017   History of gestational diabetes 11/26/2019   Hyperlipidemia    Vaginal Pap smear, abnormal    Past Surgical History: Past Surgical History:  Procedure Laterality Date   ANKLE SURGERY Right 2019   artrhoscopic right ankle surgery - [facility]   COLPOSCOPY     LIPOSUCTION  01/10/2022   laser   Family History: Family History  Problem Relation Age of Onset   Hyperlipidemia Mother    Hypertension Mother    Healthy Father    Hypertension Brother    Hyperlipidemia Brother    Heart attack Maternal Grandmother    Cancer Neg Hx    Diabetes Neg Hx    Heart disease Neg Hx    Allergies: No Known Allergies Health Maintenance: Health Maintenance  Topic Date Due   COVID-19 Vaccine (4 - 2023-24 season) 07/16/2026 (Originally 06/16/2022)   INFLUENZA VACCINE  05/17/2023   PAP SMEAR-Modifier  06/27/2025   DTaP/Tdap/Td (2 - Td or Tdap) 11/19/2029   Hepatitis C Screening  Completed   HIV Screening  Completed   HPV VACCINES  Aged Out    HM Colonoscopy     This patient has no relevant Health Maintenance data.      Medications: Current Outpatient Medications on File Prior to Visit  Medication Sig Dispense Refill   acetaminophen (TYLENOL) 500 MG tablet Take 500 mg by mouth every 6  (six) hours as needed.     albuterol (VENTOLIN HFA) 108 (90 Base) MCG/ACT inhaler Inhale 1-2 puffs into the lungs every 6 (six) hours as needed for wheezing or shortness of breath. 8 g 0   BIOTIN PO Take by mouth.     clobetasol (TEMOVATE) 0.05 % external solution Apply 1 application. topically 2 (two) times daily.     Clobetasol Propionate 0.05 % shampoo Apply 1 application. topically 3 (three) times a week.     COLLAGEN PO Take by mouth.     diclofenac (VOLTAREN) 75 MG EC tablet TAKE 1 TABLET BY MOUTH 2 TIMES DAILY AS NEEDED. 60 tablet 0   Turmeric (QC TUMERIC COMPLEX PO) Take by mouth.     VITAMIN D PO Take by mouth.     No current facility-administered medications on file prior to visit.    Review of Systems: No headache, visual changes, nausea, vomiting, diarrhea, constipation, dizziness, abdominal pain, skin rash, fevers, chills, night sweats, swollen lymph nodes, weight loss, chest pain, body aches, joint swelling, muscle aches, shortness of breath, mood changes, visual or auditory hallucinations reported.  Objective:   Vitals:   03/02/23 1525  BP: 124/80  Pulse: 76  SpO2: 98%   Vitals:   03/02/23 1525  Weight: 154 lb (69.9 kg)  Height: 5\' 4"  (1.626 m)  Body mass index is 26.43 kg/m.  General: Well Developed, well nourished, and in no acute distress.  Neuro: Alert and oriented x3, extra-ocular muscles intact, sensation grossly intact. Cranial nerves II through XII are grossly intact, motor, sensory, and coordinative functions are intact. HEENT: Normocephalic, atraumatic, pupils equal round reactive to light, neck supple, no masses, no lymphadenopathy, thyroid nonpalpable. Oropharynx, nasopharynx, external ear canals are unremarkable. Skin: Warm and dry, no rashes noted.  Cardiac: Regular rate and rhythm, no murmurs rubs or gallops. No peripheral edema. Pulses symmetric. Respiratory: Clear to auscultation bilaterally. Not using accessory muscles, speaking in full sentences.   Abdominal: Soft, nontender, nondistended, positive bowel sounds, no masses, no organomegaly. Musculoskeletal: Shoulder, elbow, wrist, hip, knee, ankle stable, and with full range of motion.  Female chaperone initials: KG present throughout the physical examination.  Impression and Recommendations:   The patient was counselled, risk factors were discussed, and anticipatory guidance given.  Problem List Items Addressed This Visit       Musculoskeletal and Integument   Right supraspinatus tendinitis   Relevant Orders   MR Shoulder Right Wo Contrast   DJD (degenerative joint disease)   Relevant Orders   Ambulatory referral to Pain Clinic   Dermatosis of scalp   Relevant Orders   Ambulatory referral to Dermatology     Other   Mixed hyperlipidemia   Relevant Orders   Comprehensive metabolic panel   Lipid panel   Healthcare maintenance - Primary    Annual examination completed, risk stratification labs ordered, anticipatory guidance provided.  We will follow labs once resulted.      Relevant Orders   Apo A1 + B + Ratio   CBC   Comprehensive metabolic panel   Lipid panel   TSH   VITAMIN D 25 Hydroxy (Vit-D Deficiency, Fractures)   Annual physical exam   Relevant Orders   Apo A1 + B + Ratio   CBC   Comprehensive metabolic panel   Lipid panel   TSH   VITAMIN D 25 Hydroxy (Vit-D Deficiency, Fractures)   Other Visit Diagnoses     Vitamin D deficiency       Relevant Orders   VITAMIN D 25 Hydroxy (Vit-D Deficiency, Fractures)        Orders & Medications Medications: No orders of the defined types were placed in this encounter.  Orders Placed This Encounter  Procedures   MR Shoulder Right Wo Contrast   Apo A1 + B + Ratio   CBC   Comprehensive metabolic panel   Lipid panel   TSH   VITAMIN D 25 Hydroxy (Vit-D Deficiency, Fractures)   Ambulatory referral to Pain Clinic   Ambulatory referral to Dermatology     Return in about 1 year (around 03/01/2024).     Jerrol Banana, MD, [facility]   Primary Care Sports Medicine Primary Care and Sports Medicine at [facility]

## 2023-03-21 ENCOUNTER — Ambulatory Visit: Payer: 59

## 2023-03-22 ENCOUNTER — Ambulatory Visit
Admission: RE | Admit: 2023-03-22 | Discharge: 2023-03-22 | Disposition: A | Discharge status: Home / Self Care | Payer: 59 | Source: Ambulatory Visit | Attending: Family Medicine | Admitting: Family Medicine

## 2023-03-22 DIAGNOSIS — M7591 Shoulder lesion, unspecified, right shoulder: Secondary | ICD-10-CM | POA: Insufficient documentation

## 2023-04-05 ENCOUNTER — Other Ambulatory Visit: Payer: Self-pay

## 2023-04-05 DIAGNOSIS — G8929 Other chronic pain: Secondary | ICD-10-CM

## 2023-04-05 NOTE — Progress Notes (Signed)
Ref placed.

## 2023-04-25 ENCOUNTER — Ambulatory Visit (HOSPITAL_BASED_OUTPATIENT_CLINIC_OR_DEPARTMENT_OTHER): Payer: Managed Care, Other (non HMO) | Admitting: Pain Medicine

## 2023-04-25 DIAGNOSIS — S43431S Superior glenoid labrum lesion of right shoulder, sequela: Secondary | ICD-10-CM | POA: Insufficient documentation

## 2023-04-25 DIAGNOSIS — M899 Disorder of bone, unspecified: Secondary | ICD-10-CM | POA: Insufficient documentation

## 2023-04-25 DIAGNOSIS — G8929 Other chronic pain: Secondary | ICD-10-CM | POA: Insufficient documentation

## 2023-04-25 DIAGNOSIS — R936 Abnormal findings on diagnostic imaging of limbs: Secondary | ICD-10-CM | POA: Insufficient documentation

## 2023-04-25 DIAGNOSIS — M7581 Other shoulder lesions, right shoulder: Secondary | ICD-10-CM | POA: Insufficient documentation

## 2023-04-25 DIAGNOSIS — G894 Chronic pain syndrome: Secondary | ICD-10-CM | POA: Insufficient documentation

## 2023-04-25 DIAGNOSIS — Z79899 Other long term (current) drug therapy: Secondary | ICD-10-CM | POA: Insufficient documentation

## 2023-04-25 DIAGNOSIS — Z91199 Patient's noncompliance with other medical treatment and regimen due to unspecified reason: Secondary | ICD-10-CM

## 2023-04-25 DIAGNOSIS — M19011 Primary osteoarthritis, right shoulder: Secondary | ICD-10-CM | POA: Insufficient documentation

## 2023-04-25 DIAGNOSIS — Z789 Other specified health status: Secondary | ICD-10-CM | POA: Insufficient documentation

## 2023-04-25 NOTE — Progress Notes (Signed)
(  04/25/2023) NO-SHOW to [facility] at [facility] initial evaluation.

## 2023-05-02 ENCOUNTER — Telehealth: Payer: 59 | Admitting: Physician Assistant

## 2023-05-02 DIAGNOSIS — J069 Acute upper respiratory infection, unspecified: Secondary | ICD-10-CM

## 2023-05-02 MED ORDER — FLUTICASONE PROPIONATE 50 MCG/ACT NA SUSP
2.0000 | Freq: Every day | NASAL | 0 refills | Status: AC
Start: 2023-05-02 — End: ?

## 2023-05-02 MED ORDER — PROMETHAZINE-DM 6.25-15 MG/5ML PO SYRP
5.0000 mL | ORAL_SOLUTION | Freq: Four times a day (QID) | ORAL | 0 refills | Status: DC | PRN
Start: 2023-05-02 — End: 2023-05-25

## 2023-05-02 NOTE — Progress Notes (Signed)
E-Visit for Upper Respiratory Infection   We are sorry you are not feeling well.  Here is how we plan to help!  Based on what you have shared with me, it looks like you may have a viral upper respiratory infection.  Upper respiratory infections are caused by a large number of viruses; however, rhinovirus is the most common cause.   Symptoms vary from person to person, with common symptoms including sore throat, cough, fatigue or lack of energy and feeling of general discomfort.  A low-grade fever of up to 100.4 may present, but is often uncommon.  Symptoms vary however, and are closely related to a person's age or underlying illnesses.  The most common symptoms associated with an upper respiratory infection are nasal discharge or congestion, cough, sneezing, headache and pressure in the ears and face.  These symptoms usually persist for about 3 to 10 days, but can last up to 2 weeks.  It is important to know that upper respiratory infections do not cause serious illness or complications in most cases.    Upper respiratory infections can be transmitted from person to person, with the most common method of transmission being a person's hands.  The virus is able to live on the skin and can infect other persons for up to 2 hours after direct contact.  Also, these can be transmitted when someone coughs or sneezes; thus, it is important to cover the mouth to reduce this risk.  To keep the spread of the illness at bay, good hand hygiene is very important.  This is an infection that is most likely caused by a virus. There are no specific treatments other than to help you with the symptoms until the infection runs its course.  We are sorry you are not feeling well.  Here is how we plan to help!   For nasal congestion, you may use an oral decongestants such as Mucinex D or if you have glaucoma or high blood pressure use plain Mucinex.  Saline nasal spray or nasal drops can help and can safely be used as often as  needed for congestion.  For your congestion, I have prescribed Fluticasone nasal spray one spray in each nostril twice a day  If you do not have a history of heart disease, hypertension, diabetes or thyroid disease, prostate/bladder issues or glaucoma, you may also use Sudafed to treat nasal congestion.  It is highly recommended that you consult with a pharmacist or your primary care physician to ensure this medication is safe for you to take.     If you have a cough, you may use cough suppressants such as Delsym and Robitussin.  If you have glaucoma or high blood pressure, you can also use Coricidin HBP.   For cough I have prescribed for you Promethazine DM cough syrup Take 5mL every 6 hours as needed for cough (may cause drowsiness).   If you have a sore or scratchy throat, use a saltwater gargle-  to  teaspoon of salt dissolved in a 4-ounce to 8-ounce glass of warm water.  Gargle the solution for approximately 15-30 seconds and then spit.  It is important not to swallow the solution.  You can also use throat lozenges/cough drops and Chloraseptic spray to help with throat pain or discomfort.  Warm or cold liquids can also be helpful in relieving throat pain.  For headache, pain or general discomfort, you can use Ibuprofen or Tylenol as directed.   Some authorities believe that zinc sprays or  the use of Echinacea may shorten the course of your symptoms.  I would recommend to take an at home Covid 19 test as we have seen an increase in cases this summer.   HOME CARE Only take medications as instructed by your medical team. Be sure to drink plenty of fluids. Water is fine as well as fruit juices, sodas and electrolyte beverages. You may want to stay away from caffeine or alcohol. If you are nauseated, try taking small sips of liquids. How do you know if you are getting enough fluid? Your urine should be a pale yellow or almost colorless. Get rest. Taking a steamy shower or using a humidifier may  help nasal congestion and ease sore throat pain. You can place a towel over your head and breathe in the steam from hot water coming from a faucet. Using a saline nasal spray works much the same way. Cough drops, hard candies and sore throat lozenges may ease your cough. Avoid close contacts especially the very young and the elderly Cover your mouth if you cough or sneeze Always remember to wash your hands.   GET HELP RIGHT AWAY IF: You develop worsening fever. If your symptoms do not improve within 10 days You develop yellow or green discharge from your nose over 3 days. You have coughing fits You develop a severe head ache or visual changes. You develop shortness of breath, difficulty breathing or start having chest pain Your symptoms persist after you have completed your treatment plan  MAKE SURE YOU  Understand these instructions. Will watch your condition. Will get help right away if you are not doing well or get worse.  Thank you for choosing an e-visit.  Your e-visit answers were reviewed by a board certified advanced clinical practitioner to complete your personal care plan. Depending upon the condition, your plan could have included both over the counter or prescription medications.  Please review your pharmacy choice. Make sure the pharmacy is open so you can pick up prescription now. If there is a problem, you may contact your provider through [employer] and have the prescription routed to another pharmacy.  Your safety is important to [place]. If you have drug allergies check your prescription carefully.   For the next 24 hours you can use MyChart to ask questions about today's visit, request a non-urgent call back, or ask for a work or school excuse. You will get an email in the next two days asking about your experience. I hope that your e-visit has been valuable and will speed your recovery.     I have spent 5 minutes in review of e-visit questionnaire, review and  updating patient chart, medical decision making and response to patient.   Margaretann Loveless, PA-C

## 2023-05-04 ENCOUNTER — Other Ambulatory Visit: Payer: Self-pay | Admitting: Nurse Practitioner

## 2023-05-04 DIAGNOSIS — J069 Acute upper respiratory infection, unspecified: Secondary | ICD-10-CM

## 2023-05-04 MED ORDER — ALBUTEROL SULFATE HFA 108 (90 BASE) MCG/ACT IN AERS
2.0000 | INHALATION_SPRAY | Freq: Four times a day (QID) | RESPIRATORY_TRACT | 0 refills | Status: AC | PRN
Start: 2023-05-04 — End: ?

## 2023-05-04 NOTE — Progress Notes (Signed)
Meds ordered this encounter  Medications   fluticasone (FLONASE) 50 MCG/ACT nasal spray    Sig: Place 2 sprays into both nostrils daily.    Dispense:  16 g    Refill:  0    Order Specific Question:   Supervising Provider    Answer:   Merrilee Jansky [id number]   promethazine-dextromethorphan (PROMETHAZINE-DM) 6.25-15 MG/5ML syrup    Sig: Take 5 mLs by mouth 4 (four) times daily as needed.    Dispense:  118 mL    Refill:  0    Order Specific Question:   Supervising Provider    Answer:   Merrilee Jansky [[id number]]   albuterol (VENTOLIN HFA) 108 (90 Base) MCG/ACT inhaler    Sig: Inhale 2 puffs into the lungs every 6 (six) hours as needed for wheezing or shortness of breath.    Dispense:  8 g    Refill:  0

## 2023-05-04 NOTE — Addendum Note (Signed)
Addended by: Viviano Simas E on: 05/04/2023 07:01 AM   Modules accepted: Orders

## 2023-05-08 ENCOUNTER — Ambulatory Visit
Admission: RE | Admit: 2023-05-08 | Discharge: 2023-05-08 | Disposition: A | Discharge status: Home / Self Care | Payer: 59 | Source: Ambulatory Visit | Attending: Emergency Medicine | Admitting: Emergency Medicine

## 2023-05-08 VITALS — BP 114/69 | HR 76 | Temp 98.7°F | Resp 18

## 2023-05-08 DIAGNOSIS — J029 Acute pharyngitis, unspecified: Secondary | ICD-10-CM

## 2023-05-08 DIAGNOSIS — J0100 Acute maxillary sinusitis, unspecified: Secondary | ICD-10-CM

## 2023-05-08 LAB — POCT RAPID STREP A (OFFICE): Rapid Strep A Screen: NEGATIVE

## 2023-05-08 MED ORDER — AMOXICILLIN 875 MG PO TABS: 875.0000 mg | ORAL_TABLET | Freq: Two times a day (BID) | ORAL | 0 refills | Status: AC

## 2023-05-08 NOTE — Discharge Instructions (Addendum)
Take the amoxicillin.  Follow up with your primary care provider if your symptoms are not improving.
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
## 2023-05-08 NOTE — ED Triage Notes (Signed)
Patient to Urgent Care with complaints of sore throat that started seven days ago following a Covid exposure. Reports waking up this morning with increased throat pain/ pain with swallowing.   Fever last Wednesday and Thursday.

## 2023-05-17 ENCOUNTER — Other Ambulatory Visit: Payer: Self-pay | Admitting: Family Medicine

## 2023-05-17 ENCOUNTER — Encounter: Payer: Self-pay | Admitting: Family Medicine

## 2023-05-17 DIAGNOSIS — E559 Vitamin D deficiency, unspecified: Secondary | ICD-10-CM

## 2023-05-17 MED ORDER — VITAMIN D (ERGOCALCIFEROL) 1.25 MG (50000 UNIT) PO CAPS
50000.0000 [IU] | ORAL_CAPSULE | ORAL | 0 refills | Status: AC
Start: 2023-05-17 — End: ?

## 2023-05-17 NOTE — Telephone Encounter (Signed)
Please review.  KP

## 2023-05-17 NOTE — Telephone Encounter (Signed)
Labs

## 2023-05-20 ENCOUNTER — Other Ambulatory Visit: Payer: Self-pay | Admitting: Family Medicine

## 2023-05-20 DIAGNOSIS — L659 Nonscarring hair loss, unspecified: Secondary | ICD-10-CM

## 2023-05-25 ENCOUNTER — Ambulatory Visit (INDEPENDENT_AMBULATORY_CARE_PROVIDER_SITE_OTHER): Payer: 59 | Admitting: Family Medicine

## 2023-05-25 ENCOUNTER — Encounter: Payer: Self-pay | Admitting: Family Medicine

## 2023-05-25 VITALS — BP 112/94 | HR 80 | Ht 64.0 in | Wt 158.0 lb

## 2023-05-25 DIAGNOSIS — L658 Other specified nonscarring hair loss: Secondary | ICD-10-CM

## 2023-05-25 DIAGNOSIS — G4709 Other insomnia: Secondary | ICD-10-CM | POA: Diagnosis not present

## 2023-05-25 MED ORDER — QUVIVIQ 25 MG PO TABS: 1.0000 | ORAL_TABLET | Freq: Every evening | ORAL | 2 refills | Status: DC | PRN

## 2023-05-25 MED ORDER — MINOXIDIL FOR WOMEN 5 % EX FOAM: 1.0000 | Freq: Two times a day (BID) | CUTANEOUS | 3 refills | Status: AC

## 2023-05-25 MED ORDER — QUVIVIQ 25 MG PO TABS
1.0000 | ORAL_TABLET | Freq: Every evening | ORAL | 2 refills | Status: DC | PRN
Start: 2023-05-25 — End: 2023-09-04

## 2023-05-25 NOTE — Progress Notes (Signed)
     Primary Care / Sports Medicine Office Visit  Patient Information:  Patient ID: Christy Farmer, female DOB: [birth year] Age: 37 y.o. MRN: [id number]   Christy Farmer is a pleasant 37 y.o. female presenting with the following:  Chief Complaint  Patient presents with   Alopecia    X 1 year    Vitals:   05/25/23 1533  BP: (!) 112/94  Pulse: 80  SpO2: 97%   Vitals:   05/25/23 1533  Weight: 158 lb (71.7 kg)  Height: 5\' 4"  (1.626 m)   Body mass index is 27.12 kg/m.  No results found.   Independent interpretation of notes and tests performed by another provider:   None  Procedures performed:   None  Pertinent History, Exam, Impression, and Recommendations:   Christy Farmer was seen today for alopecia.  Female pattern hair loss Assessment & Plan: Patient with roughly 1 year history of hair thinning, denies any patchy hair loss, no hairline regression, uncertain about female family members with similar conditions, does bring up possible increased stress related to work over this timeframe.  No specific treatments to date.  Examination without anterior hairline regression, no areas of patchy hair loss, no hair texture abnormalities.  We reviewed the differential for hair loss, labs had been ordered as part of the workup for this chronic condition and all results have been reassuring thus far.  Plan: - Will contact with remaining lab results once available - Start topical minoxidil (Rogaine), if Rx not covered pick up OTC - Consider biotin OTC daily supplement  Orders: -     Minoxidil for Women; Apply 1 Application topically 2 (two) times daily.  Dispense: 60 g; Refill: 3  Other insomnia Assessment & Plan: Ongoing for roughly 1 year, attributes this to not being able to shut off mind, partner with snoring, possibly stress.  We discussed the link between stress and sleep in addition to comorbid hair thinning concerns.  Plan as follows: - Trial Quviviq 25 to 30 mg  nightly as needed - Sleep hygiene information provided - Close follow-up in 3 months, if improving can consider further augmentation by modifying dose versus adjunct SSRI/SNRI  Orders: -     Minoxidil for Women; Apply 1 Application topically 2 (two) times daily.  Dispense: 60 g; Refill: 3 -     Quviviq; Take 1 tablet (25 mg total) by mouth at bedtime as needed.  Dispense: 30 tablet; Refill: 2     Orders & Medications Meds ordered this encounter  Medications   DISCONTD: Daridorexant HCl (QUVIVIQ) 25 MG TABS    Sig: Take 1 tablet (25 mg total) by mouth at bedtime as needed.    Dispense:  30 tablet    Refill:  2   Minoxidil (MINOXIDIL FOR WOMEN) 5 % FOAM    Sig: Apply 1 Application topically 2 (two) times daily.    Dispense:  60 g    Refill:  3   Daridorexant HCl (QUVIVIQ) 25 MG TABS    Sig: Take 1 tablet (25 mg total) by mouth at bedtime as needed.    Dispense:  30 tablet    Refill:  2   No orders of the defined types were placed in this encounter.    No follow-ups on file.     Jerrol Banana, MD, [facility]   Primary Care Sports Medicine Primary Care and Sports Medicine at [facility]

## 2023-05-25 NOTE — Patient Instructions (Signed)
For hair: - Will contact with remaining lab results once available - Start topical minoxidil (Rogaine), if Rx not covered pick up OTC - Consider biotin OTC daily supplement  For sleep: - Use 1-2 tablets Quviviq (25-50 mg) nightly as needed - Review information attached and make lifestyle changes were appropriate  -Return in 3 months

## 2023-05-25 NOTE — Assessment & Plan Note (Signed)
Ongoing for roughly 1 year, attributes this to not being able to shut off mind, partner with snoring, possibly stress.  We discussed the link between stress and sleep in addition to comorbid hair thinning concerns.  Plan as follows: - Trial Quviviq 25 to 30 mg nightly as needed - Sleep hygiene information provided - Close follow-up in 3 months, if improving can consider further augmentation by modifying dose versus adjunct SSRI/SNRI

## 2023-05-25 NOTE — Assessment & Plan Note (Signed)
Patient with roughly 1 year history of hair thinning, denies any patchy hair loss, no hairline regression, uncertain about female family members with similar conditions, does bring up possible increased stress related to work over this timeframe.  No specific treatments to date.  Examination without anterior hairline regression, no areas of patchy hair loss, no hair texture abnormalities.  We reviewed the differential for hair loss, labs had been ordered as part of the workup for this chronic condition and all results have been reassuring thus far.  Plan: - Will contact with remaining lab results once available - Start topical minoxidil (Rogaine), if Rx not covered pick up OTC - Consider biotin OTC daily supplement

## 2023-06-01 ENCOUNTER — Other Ambulatory Visit: Payer: Self-pay

## 2023-06-01 DIAGNOSIS — R768 Other specified abnormal immunological findings in serum: Secondary | ICD-10-CM

## 2023-06-01 LAB — FSH/LH: FSH: 8.1 m[IU]/mL

## 2023-06-01 NOTE — Progress Notes (Signed)
Referral placed.

## 2023-06-05 ENCOUNTER — Encounter (INDEPENDENT_AMBULATORY_CARE_PROVIDER_SITE_OTHER): Payer: 59 | Admitting: Family Medicine

## 2023-06-05 ENCOUNTER — Other Ambulatory Visit: Payer: Self-pay | Admitting: Family Medicine

## 2023-06-05 DIAGNOSIS — M7711 Lateral epicondylitis, right elbow: Secondary | ICD-10-CM | POA: Diagnosis not present

## 2023-06-05 MED ORDER — PREDNISONE 50 MG PO TABS: 50.0000 mg | ORAL_TABLET | Freq: Every day | ORAL | 0 refills | Status: DC

## 2023-06-05 NOTE — Telephone Encounter (Signed)
Please advise 

## 2023-06-05 NOTE — Telephone Encounter (Signed)
Please see the MyChart message reply(ies) for my assessment and plan.    This patient gave consent for this Medical Advice Message and is aware that it may result in a bill to [employer], as well as the possibility of receiving a bill for a co-payment or deductible. They are an established patient, but are not seeking medical advice exclusively about a problem treated during an in person or video visit in the last seven days. I did not recommend an in person or video visit within seven days of my reply.    I spent a total of 24 minutes cumulative time within 7 days through [employer].  Jerrol Banana, MD

## 2023-08-20 ENCOUNTER — Encounter: Payer: Self-pay | Admitting: Obstetrics and Gynecology

## 2023-08-23 NOTE — Progress Notes (Signed)
GYNECOLOGY PROGRESS NOTE  Subjective:    Patient ID: Christy Farmer, female    DOB: 19-Jul-1986, 37 y.o.   MRN: [id number]  HPI  Patient is a 37 y.o. [id number] female who presents for abnormal menstrual cycles. She has concerns that her menstrual cycles are coming a little bit earlier each month, previously every 28 to 30 days, now coming every 25 or 26 days over the past 6 months.  Most recently 3 months ago she began to also note that her cycles were starting twice a month.  Denies any recent changes in her weight, stress levels, medications, or diet.  Utilizes female vasectomy for contraception.  Aside from this, patient does note that she has been undergoing a workup for her loss by her PCP.  Does report that she had some initial labs that were abnormal and was referred to a rheumatologist, however at that appointment there were no major concerns for any autoimmune disorder.   Menstrual History Period Duration (Days): 4-5 Period Pattern: (!) Irregular Menstrual Flow: Heavy Menstrual Control: Maxi pad, Tampon Menstrual Control Change Freq (Hours): 3-4 Dysmenorrhea: (!) Moderate Dysmenorrhea Symptoms: Cramping   The following portions of the patient's history were reviewed and updated as appropriate:   She  has a past medical history of Abnormal uterine bleeding (AUB), Allergy, Asthma, Constipation, Enlarged uterus, Gestational diabetes, Heavy periods, Hepatitis A (09/16/2017), History of gestational diabetes (11/26/2019), Hyperlipidemia, and Vaginal Pap smear, abnormal.  She  has a past surgical history that includes Colposcopy; Ankle surgery (Right, 2019); and Liposuction (01/10/2022).  Her family history includes Healthy in her father; Heart attack in her maternal grandmother; Hyperlipidemia in her brother and mother; Hypertension in her brother and mother.  She  reports that she has never smoked. She has never used smokeless tobacco. She reports current alcohol use of about  2.0 standard drinks of alcohol per week. She reports that she does not use drugs.   Current Outpatient Medications on File Prior to Visit  Medication Sig Dispense Refill   acetaminophen (TYLENOL) 500 MG tablet Take 500 mg by mouth every 6 (six) hours as needed.     albuterol (VENTOLIN HFA) 108 (90 Base) MCG/ACT inhaler Inhale 2 puffs into the lungs every 6 (six) hours as needed for wheezing or shortness of breath. 8 g 0   Daridorexant HCl (QUVIVIQ) 25 MG TABS Take 1 tablet (25 mg total) by mouth at bedtime as needed. 30 tablet 2   diclofenac (VOLTAREN) 75 MG EC tablet TAKE 1 TABLET BY MOUTH 2 TIMES DAILY AS NEEDED. 60 tablet 0   fluticasone (FLONASE) 50 MCG/ACT nasal spray Place 2 sprays into both nostrils daily. 16 g 0   Minoxidil (MINOXIDIL FOR WOMEN) 5 % FOAM Apply 1 Application topically 2 (two) times daily. 60 g 3   Turmeric (QC TUMERIC COMPLEX PO) Take by mouth.     VITAMIN D PO Take by mouth.     Vitamin D, Ergocalciferol, (DRISDOL) 1.25 MG (50000 UNIT) CAPS capsule Take 1 capsule (50,000 Units total) by mouth every 7 (seven) days. Take for 8 total doses(weeks) 8 capsule 0   No current facility-administered medications on file prior to visit.   She has No Known Allergies..  Review of Systems Pertinent items noted in HPI and remainder of comprehensive ROS otherwise negative.   Objective:   Blood pressure 101/70, pulse 61, resp. rate 16, height 5\' 4"  (1.626 m), weight 156 lb 11.2 oz (71.1 kg), last menstrual period 08/20/2023.  There is  no height or weight on file to calculate BMI. General appearance: alert and no distress Abdomen: soft, non-tender; bowel sounds normal; no masses,  no organomegaly Pelvic: external genitalia normal, rectovaginal septum normal.  Vagina without discharge, scant blood in vaginal vault.  Cervix normal appearing, no lesions and no motion tenderness.  Uterus mobile, nontender, normal shape and size.  Adnexae non-palpable, nontender bilaterally.  Extremities:  extremities normal, atraumatic, no cyanosis or edema Neurologic: Grossly normal   Labs:  Lab Results  Component Value Date   WBC 7.1 05/16/2023   HGB 12.2 05/16/2023   HCT 37.1 05/16/2023   MCV 87 05/16/2023   PLT 335 05/16/2023    Lab Results  Component Value Date   TSH 2.370 05/16/2023   Orders Only on 05/20/2023  Component Date Value Ref Range Status   Testosterone 05/22/2023 28  8 - 60 ng/dL Final   Testosterone, Free 05/22/2023 1.4  0.0 - 4.2 pg/mL Final   DHEA-SO4 05/22/2023 151.0  57.3 - 279.2 ug/dL Final   LH 40/98/1191 8.5  mIU/mL Final   Comment:                      Adult Female              Range                       Follicular phase      2.4 -  12.6                       Ovulation phase      14.0 -  95.6                       Luteal phase          1.0 -  11.4                       Postmenopausal        7.7 -  58.5    FSH 05/22/2023 8.1  mIU/mL Final   Comment:                      Adult Female             Range                       Follicular phase      3.5 -  12.5                       Ovulation phase       4.7 -  21.5                       Luteal phase          1.7 -   7.7                       Postmenopausal       25.8 - 134.8    Anti-Nuclear Ab by IFA (RDL) 05/22/2023 Positive (A)  Negative Final   Speckled Pattern 05/22/2023 1:80 (H)  <1:40 Final   Spindle Apparatus Pattern 05/22/2023 1:80 (H)  <1:40 Final   Note: 05/22/2023 Comment   Final   ANA performed by Indirect Fluorescent Antibody (IFA)   Anti-Centromere Ab (  RDL) 05/22/2023 <1:40  <1:40 Final   Anti-dsDNA Ab by Farr(RDL) 05/22/2023 <8.0  <8.0 IU/mL Final   Anti-Sm Ab (RDL) 05/22/2023 <20  <20 Units Final   Anti-U1 RNP Ab (RDL) 05/22/2023 <20  <20 Units Final   Anti-Ro (SS-A) Ab (RDL) 05/22/2023 <20  <20 Units Final   Anti-La (SS-B) Ab (RDL) 05/22/2023 <20  <20 Units Final   Anti-Scl-70 Ab (RDL) 05/22/2023 <20  <20 Units Final   Anti-Cardiolipin Ab, IgG (RDL) 05/22/2023 <15  <15 GPL U/mL  Final   Anti-Cardiolipin Ab, IgA (RDL) 05/22/2023 <12  <12 APL U/mL Final   Anti-Cardiolipin Ab, IgM (RDL) 05/22/2023 <13  <13 MPL U/mL Final   C3 Complement (RDL) 05/22/2023 155  82 - 409 mg/dL Final   C4 Complement (RDL) 05/22/2023 29  14 - 44 mg/dL Final   Anti-TPO Ab (RDL) 05/22/2023 <9.0  <9.0 IU/mL Final   Anti-Chromatin Ab, IgG (RDL) 05/22/2023 <20  <20 Units Final   Anti-CCP Ab, IgG & IgA (RDL) 05/22/2023 <20  <20 Units Final   Rheumatoid Factor by Turb RDL 05/22/2023 <14  <14 IU/mL Final   ANA Plus 12 Interpretation 05/22/2023 Comment   Final   Comment:    Interpretation for Anti-Sm, Anti-U1 RNP, Anti-Ro,     Anti-La:         Negative:                                <20         Weak Positive:                       20 - 39         Moderate Positive:                   40 - 80         Strong Positive:                         >80    Interpretation for Anti-CCP Ab, IgG / IgA:         Negative:                                <20         Weak Positive:                       20 - 39         Moderate Positive:                   40 - 59         Strong Positive:                         >59    Interpretation for Anti-Cardiolipin Ab:     Negative:               GPL <15, APL <12, MPL <13     Indeterminate:    GPL 15-20, APL 12-20, MPL 13-20     Low Positive:           GPL, APL, MPL    >20 - 40     Med Positive:  GPL, APL, MPL    >40 - 80     High Positive:          GPL, APL, MPL         >80 SLE classification criteria are based on Med to High titer (>40) Anti-Cardiolipin Ab (aCL). aCL may be elevated transiently with cer                          tain infections and may increase spuriously in the presence of rheumatoid factor.      Assessment:   1. Metrorrhagia      Plan:   1. Metrorrhagia -Clear cause at this time for patient's onset of metrorrhagia with cycle length decreasing.  Discussed etiologies such as hormonal fluctuations, structural problems including uterine  fibroids or polyps are often times the most typical findings.  Will have patient initiate trial of OCPs for 2 to 3 months in order to regulate menstrual cycles.  In the meantime we will order pelvic ultrasound to assess for other causes of her abnormal bleeding. - US PELVIC COMPLETE WITH TRANSVAGINAL; Future      Hildred Laser, MD [facility]

## 2023-08-24 ENCOUNTER — Encounter: Payer: Self-pay | Admitting: Obstetrics and Gynecology

## 2023-08-24 ENCOUNTER — Ambulatory Visit: Payer: Managed Care, Other (non HMO) | Admitting: Family Medicine

## 2023-08-24 ENCOUNTER — Ambulatory Visit (INDEPENDENT_AMBULATORY_CARE_PROVIDER_SITE_OTHER): Payer: 59 | Admitting: Obstetrics and Gynecology

## 2023-08-24 VITALS — BP 101/70 | HR 61 | Resp 16 | Ht 64.0 in | Wt 156.7 lb

## 2023-08-24 DIAGNOSIS — N921 Excessive and frequent menstruation with irregular cycle: Secondary | ICD-10-CM

## 2023-09-04 ENCOUNTER — Ambulatory Visit (INDEPENDENT_AMBULATORY_CARE_PROVIDER_SITE_OTHER): Payer: 59 | Admitting: Family Medicine

## 2023-09-04 ENCOUNTER — Encounter: Payer: Self-pay | Admitting: Family Medicine

## 2023-09-04 VITALS — BP 102/72 | HR 73 | Temp 98.3°F | Ht 64.0 in | Wt 156.0 lb

## 2023-09-04 DIAGNOSIS — J029 Acute pharyngitis, unspecified: Secondary | ICD-10-CM | POA: Diagnosis not present

## 2023-09-04 DIAGNOSIS — G4709 Other insomnia: Secondary | ICD-10-CM | POA: Diagnosis not present

## 2023-09-04 LAB — POCT RAPID STREP A (OFFICE): Rapid Strep A Screen: NEGATIVE

## 2023-09-04 MED ORDER — QUVIVIQ 25 MG PO TABS: 1.0000 | ORAL_TABLET | Freq: Every evening | ORAL | 0 refills | Status: DC | PRN

## 2023-09-04 MED ORDER — AMOXICILLIN 500 MG PO TABS
1000.0000 mg | ORAL_TABLET | Freq: Every day | ORAL | 0 refills | Status: AC
Start: 2023-09-04 — End: 2023-09-14

## 2023-09-04 NOTE — Patient Instructions (Addendum)
-   Pharyngitis:  Throat swab negative for strep   Take antibiotics for full course  Continue OTC pain relievers, salt water gargles, hot teas with honey for symptom relief  - Insomnia:  Prescribed Quviviq to take nightly as needed for sleep  Consider alternative medication (e.g. Trazodone) in the future

## 2023-09-04 NOTE — Assessment & Plan Note (Addendum)
History of Present Illness Presents with a six-day history of throat pain and difficulty swallowing. The discomfort began as a mild itch in the throat and has progressively worsened to the point where the patient is unable to eat due to the pain. The patient also reports difficulty sleeping due to the discomfort. Breathing is unaffected, but the patient notes the presence of phlegm in the back of the throat, which is described as hard and yellow-green in color. There are no other associated symptoms such as pressure on the forehead, nasal bridge, or cheeks, ear pain, nasal congestion, cough, stomach issues, or changes in bowel habits. The patient has been self-medicating with over-the-counter NyQuil and has tried salt water gargles and a home remedy involving honey and onion, with no relief.  Physical Exam HEENT: Left ear normal. Right ear slight clear fluid behind eardrum without infection.  Bilateral canals benign.  Throat erythematous, no exudate. Nasal turbinates erythematous. Sinuses non-tender. NECK: Right posterior cervical lymph node palpable, nontender. CHEST: Lungs clear to auscultation. CARDIOVASCULAR: Heart sounds normal.  Results LABS Strep throat swab: Negative (09/04/2023)  Assessment & Plan Pharyngitis Throat pain and difficulty swallowing for 6 days. Throat swab negative for strep. Phlegm is yellow-green. No cough, sinus pressure, ear pain, or nasal congestion. Throat and nasal turbinates are erythematous. -Prescribe antibiotics to empirically treat 6-day history of progressively worsening symptoms. -Continue symptomatic treatment with over-the-counter pain relievers, salt water gargles, and hot teas with honey.

## 2023-09-04 NOTE — Assessment & Plan Note (Addendum)
Insomnia Insurance will stop covering Quviviq in January. Patient uses it intermittently. -Send prescription for Quviviq with instructions to take 1-2 tablets as needed for sleep. -If insurance does not cover, consider alternative medication such as Trazodone.

## 2023-09-04 NOTE — Progress Notes (Signed)
Primary Care / Sports Medicine Office Visit  Patient Information:  Patient ID: Christy Farmer, female DOB: [birth year] Age: 37 y.o. MRN: [id number]   Christy Farmer is a pleasant 37 y.o. female presenting with the following:  Chief Complaint  Patient presents with   Sore Throat    X 6 days, getting worse, no sick contacts, hurts to swallow, feverish (no fever when checked)    Vitals:   09/04/23 1321  BP: 102/72  Pulse: 73  Temp: 98.3 F (36.8 C)  SpO2: 97%   Vitals:   09/04/23 1321  Weight: 156 lb (70.8 kg)  Height: 5\' 4"  (1.626 m)   Body mass index is 26.78 kg/m.  No results found.   Independent interpretation of notes and tests performed by another provider:   None  Procedures performed:   None  Pertinent History, Exam, Impression, and Recommendations:   Problem List Items Addressed This Visit       Respiratory   Pharyngitis - Primary    History of Present Illness Presents with a six-day history of throat pain and difficulty swallowing. The discomfort began as a mild itch in the throat and has progressively worsened to the point where the patient is unable to eat due to the pain. The patient also reports difficulty sleeping due to the discomfort. Breathing is unaffected, but the patient notes the presence of phlegm in the back of the throat, which is described as hard and yellow-green in color. There are no other associated symptoms such as pressure on the forehead, nasal bridge, or cheeks, ear pain, nasal congestion, cough, stomach issues, or changes in bowel habits. The patient has been self-medicating with over-the-counter NyQuil and has tried salt water gargles and a home remedy involving honey and onion, with no relief.  Physical Exam HEENT: Left ear normal. Right ear slight clear fluid behind eardrum without infection.  Bilateral canals benign.  Throat erythematous, no exudate. Nasal turbinates erythematous. Sinuses non-tender. NECK: Right  posterior cervical lymph node palpable, nontender. CHEST: Lungs clear to auscultation. CARDIOVASCULAR: Heart sounds normal.  Results LABS Strep throat swab: Negative (09/04/2023)  Assessment & Plan Pharyngitis Throat pain and difficulty swallowing for 6 days. Throat swab negative for strep. Phlegm is yellow-green. No cough, sinus pressure, ear pain, or nasal congestion. Throat and nasal turbinates are erythematous. -Prescribe antibiotics to empirically treat 6-day history of progressively worsening symptoms. -Continue symptomatic treatment with over-the-counter pain relievers, salt water gargles, and hot teas with honey.      Relevant Medications   amoxicillin (AMOXIL) 500 MG tablet   Other Relevant Orders   POCT rapid strep A     Other   Other insomnia    Insomnia Insurance will stop covering Q-Vivit in January. Patient uses it intermittently. -Send prescription for Q-Vivit with instructions to take 1-2 tablets as needed for sleep. -If insurance does not cover, consider alternative medication such as Trazodone.      Relevant Medications   Daridorexant HCl (QUVIVIQ) 25 MG TABS     Orders & Medications Medications:  Meds ordered this encounter  Medications   amoxicillin (AMOXIL) 500 MG tablet    Sig: Take 2 tablets (1,000 mg total) by mouth daily for 10 days.    Dispense:  20 tablet    Refill:  0   Daridorexant HCl (QUVIVIQ) 25 MG TABS    Sig: Take 1-2 tablets (25-50 mg total) by mouth at bedtime as needed.    Dispense:  180 tablet  Refill:  0   Orders Placed This Encounter  Procedures   POCT rapid strep A     No follow-ups on file.     Jerrol Banana, MD, [facility]   Primary Care Sports Medicine Primary Care and Sports Medicine at [facility]

## 2023-09-24 ENCOUNTER — Ambulatory Visit: Payer: 59

## 2023-09-24 DIAGNOSIS — N921 Excessive and frequent menstruation with irregular cycle: Secondary | ICD-10-CM | POA: Diagnosis not present

## 2023-12-31 ENCOUNTER — Ambulatory Visit: Admission: EM | Admit: 2023-12-31 | Discharge: 2023-12-31 | Discharge status: Against Medical Advice

## 2024-01-01 ENCOUNTER — Ambulatory Visit
Admission: EM | Admit: 2024-01-01 | Discharge: 2024-01-01 | Disposition: A | Discharge status: Home / Self Care | Attending: Emergency Medicine | Admitting: Emergency Medicine

## 2024-01-01 VITALS — BP 120/79 | HR 75 | Temp 98.0°F | Resp 18

## 2024-01-01 DIAGNOSIS — J069 Acute upper respiratory infection, unspecified: Secondary | ICD-10-CM | POA: Diagnosis not present

## 2024-01-01 DIAGNOSIS — J302 Other seasonal allergic rhinitis: Secondary | ICD-10-CM

## 2024-01-01 LAB — POC COVID19/FLU A&B COMBO
Covid Antigen, POC: NEGATIVE
Influenza A Antigen, POC: NEGATIVE
Influenza B Antigen, POC: NEGATIVE

## 2024-01-01 LAB — POCT RAPID STREP A (OFFICE): Rapid Strep A Screen: NEGATIVE

## 2024-01-01 NOTE — Discharge Instructions (Addendum)
 The strep, COVID and flu tests are negative.   Take Tylenol or ibuprofen as needed for fever or discomfort.  Take plain Mucinex as needed for congestion.  Rest and keep yourself hydrated.    Follow-up with your primary care provider if your symptoms are not improving.
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
## 2024-01-01 NOTE — ED Triage Notes (Signed)
 Patient to Urgent Care with complaints of sore throat/ nasal congestion/ post nasal drip. Denies any known fevers.  Symptoms started Friday. Feels like symptoms are worsening.   Meds: allergy meds/ ibuprofen/ vitamin C

## 2024-03-07 ENCOUNTER — Encounter: Payer: Self-pay | Admitting: Family Medicine

## 2024-05-29 ENCOUNTER — Ambulatory Visit
Admission: RE | Admit: 2024-05-29 | Discharge: 2024-05-29 | Disposition: A | Discharge status: Home / Self Care | Attending: Emergency Medicine | Admitting: Emergency Medicine

## 2024-05-29 VITALS — BP 124/76 | HR 95 | Temp 97.8°F | Resp 18

## 2024-05-29 DIAGNOSIS — B353 Tinea pedis: Secondary | ICD-10-CM | POA: Insufficient documentation

## 2024-05-29 DIAGNOSIS — R300 Dysuria: Secondary | ICD-10-CM | POA: Diagnosis present

## 2024-05-29 LAB — POCT URINE DIPSTICK
Bilirubin, UA: NEGATIVE
Glucose, UA: NEGATIVE mg/dL
Ketones, POC UA: NEGATIVE mg/dL
Nitrite, UA: NEGATIVE
POC PROTEIN,UA: NEGATIVE
Spec Grav, UA: 1.02 (ref 1.010–1.025)
Urobilinogen, UA: 0.2 U/dL
pH, UA: 7.5 (ref 5.0–8.0)

## 2024-05-29 LAB — POCT URINE PREGNANCY: Preg Test, Ur: NEGATIVE

## 2024-05-29 MED ORDER — CEPHALEXIN 500 MG PO CAPS
500.0000 mg | ORAL_CAPSULE | Freq: Two times a day (BID) | ORAL | 0 refills | Status: AC
Start: 2024-05-29 — End: 2024-06-03

## 2024-05-29 MED ORDER — CLOTRIMAZOLE 1 % EX CREA: TOPICAL_CREAM | CUTANEOUS | 0 refills | Status: AC

## 2024-05-29 NOTE — Discharge Instructions (Addendum)
 Take the antibiotic as directed.  The urine culture is pending.  We will call you if it shows the need to change or discontinue your antibiotic.    Use the fungus cream on your foot as directed.    Follow up with your primary care provider.
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
## 2024-05-29 NOTE — ED Triage Notes (Signed)
 Patient to Urgent Care with complaints of urinary frequency/ urgency and dysuria.  Symptoms x2 days. Recent travel.   Also presents w/ itchy lesion between her fourth and fifth toes on her left foot. Symptoms x2 days.

## 2024-06-02 ENCOUNTER — Ambulatory Visit (HOSPITAL_COMMUNITY): Payer: Self-pay

## 2024-06-02 LAB — URINE CULTURE: Culture: 20000 — AB

## 2024-06-17 ENCOUNTER — Encounter: Payer: Self-pay | Admitting: Family Medicine

## 2024-06-17 ENCOUNTER — Encounter: Admitting: Family Medicine

## 2024-06-17 ENCOUNTER — Ambulatory Visit (INDEPENDENT_AMBULATORY_CARE_PROVIDER_SITE_OTHER): Admitting: Family Medicine

## 2024-06-17 VITALS — BP 102/66 | HR 78 | Ht 64.0 in | Wt 146.2 lb

## 2024-06-17 DIAGNOSIS — L4050 Arthropathic psoriasis, unspecified: Secondary | ICD-10-CM

## 2024-06-17 DIAGNOSIS — J4520 Mild intermittent asthma, uncomplicated: Secondary | ICD-10-CM

## 2024-06-17 DIAGNOSIS — Z0000 Encounter for general adult medical examination without abnormal findings: Secondary | ICD-10-CM | POA: Diagnosis not present

## 2024-06-17 DIAGNOSIS — L659 Nonscarring hair loss, unspecified: Secondary | ICD-10-CM | POA: Diagnosis not present

## 2024-06-17 DIAGNOSIS — G4709 Other insomnia: Secondary | ICD-10-CM

## 2024-06-17 DIAGNOSIS — M7711 Lateral epicondylitis, right elbow: Secondary | ICD-10-CM

## 2024-06-18 ENCOUNTER — Ambulatory Visit: Payer: Self-pay | Admitting: Family Medicine

## 2024-06-18 DIAGNOSIS — L4050 Arthropathic psoriasis, unspecified: Secondary | ICD-10-CM | POA: Insufficient documentation

## 2024-06-18 LAB — CBC WITH DIFFERENTIAL/PLATELET
Basophils Absolute: 0.1 x10E3/uL (ref 0.0–0.2)
Basos: 1 %
EOS (ABSOLUTE): 0.2 x10E3/uL (ref 0.0–0.4)
Eos: 2 %
Hematocrit: 39.7 % (ref 34.0–46.6)
Hemoglobin: 12.7 g/dL (ref 11.1–15.9)
Immature Grans (Abs): 0 x10E3/uL (ref 0.0–0.1)
Immature Granulocytes: 0 %
Lymphocytes Absolute: 2.9 x10E3/uL (ref 0.7–3.1)
Lymphs: 36 %
MCH: 28.6 pg (ref 26.6–33.0)
MCHC: 32 g/dL (ref 31.5–35.7)
MCV: 89 fL (ref 79–97)
Monocytes Absolute: 0.4 x10E3/uL (ref 0.1–0.9)
Monocytes: 4 %
Neutrophils Absolute: 4.6 x10E3/uL (ref 1.4–7.0)
Neutrophils: 57 %
Platelets: 328 x10E3/uL (ref 150–450)
RBC: 4.44 x10E6/uL (ref 3.77–5.28)
RDW: 13.1 % (ref 11.7–15.4)
WBC: 8.1 x10E3/uL (ref 3.4–10.8)

## 2024-06-18 LAB — COMPREHENSIVE METABOLIC PANEL WITH GFR
ALT: 13 IU/L (ref 0–32)
AST: 16 IU/L (ref 0–40)
Albumin: 4.6 g/dL (ref 3.9–4.9)
Alkaline Phosphatase: 46 IU/L (ref 44–121)
BUN/Creatinine Ratio: 15 (ref 9–23)
BUN: 9 mg/dL (ref 6–20)
Bilirubin Total: 0.3 mg/dL (ref 0.0–1.2)
CO2: 19 mmol/L — ABNORMAL LOW (ref 20–29)
Calcium: 9.3 mg/dL (ref 8.7–10.2)
Chloride: 104 mmol/L (ref 96–106)
Creatinine, Ser: 0.62 mg/dL (ref 0.57–1.00)
Globulin, Total: 2.4 g/dL (ref 1.5–4.5)
Glucose: 87 mg/dL (ref 70–99)
Potassium: 4.1 mmol/L (ref 3.5–5.2)
Sodium: 137 mmol/L (ref 134–144)
Total Protein: 7 g/dL (ref 6.0–8.5)
eGFR: 117 mL/min/1.73 (ref >59)

## 2024-06-18 LAB — LIPID PANEL
Chol/HDL Ratio: 4.1 ratio (ref 0.0–4.4)
Cholesterol, Total: 200 mg/dL — ABNORMAL HIGH (ref 100–199)
HDL: 49 mg/dL (ref >39)
LDL Chol Calc (NIH): 137 mg/dL — ABNORMAL HIGH (ref 0–99)
Triglycerides: 78 mg/dL (ref 0–149)
VLDL Cholesterol Cal: 14 mg/dL (ref 5–40)

## 2024-06-18 LAB — HEMOGLOBIN A1C
Est. average glucose Bld gHb Est-mCnc: 108 mg/dL
Hgb A1c MFr Bld: 5.4 % (ref 4.8–5.6)

## 2024-06-18 LAB — TSH: TSH: 1.82 u[IU]/mL (ref 0.450–4.500)

## 2024-06-18 NOTE — Assessment & Plan Note (Signed)
 Asthma Asthma is well-controlled with no recent exacerbations reported.

## 2024-06-18 NOTE — Assessment & Plan Note (Addendum)
 Right lateral epicondylitis - Persistent right lateral elbow pain consistent with lateral epicondylitis - Physical therapy over six months provided partial relief - Pain continues to impact daily activities  Right lateral epicondylitis Chronic pain in the right elbow, not fully resolved with physical therapy. Tolerated cortisone 08/2022. Discussed shockwave therapy - Refer for shockwave therapy

## 2024-06-18 NOTE — Assessment & Plan Note (Signed)
 Inflammatory arthropathy and medication concerns - Diagnosed with psoriatic arthritis by rheumatology based on symptoms and blood tests - Prescribed sulfasalazine but has not initiated therapy due to concerns about potential hepatotoxicity and the requirement for regular liver function monitoring  Psoriatic arthritis Diagnosis confirmed by rheumatologist based on symptoms and blood tests. She is hesitant to start sulfasalazine due to concerns about liver monitoring. Liver function tests are well-managed. - Discuss starting sulfasalazine with rheumatologist - Order liver function tests

## 2024-06-18 NOTE — Assessment & Plan Note (Addendum)
 Annual examination completed, risk stratification labs ordered, anticipatory guidance provided.  We will follow labs once resulted.  Weight Management She has lost 10 pounds since November 2024 using Zepbound (Tears of Appetite). Current BMI is 25.1, likely within normal range without clothing. Discussed GLP-1 receptor agonists in weight management and potential intermittent use. Emphasized multifactorial nature of weight management and benefits of weight loss on overall health. - Continue Zepbound - Order thyroid  function tests

## 2024-06-18 NOTE — Assessment & Plan Note (Signed)
 Insomnia Intermittent use of prescribed sleep medication is effective. Discussed concerns about addiction and reassured that the medication is not addictive. Encouraged continued use as needed for sleep management. - Continue current Quviviq  nightly as-needed, may note benefit from consistent nightly dosing

## 2024-06-18 NOTE — Patient Instructions (Signed)
-   Obtain fasting labs with orders provided (can have water or black coffee but otherwise no food or drink x 8 hours before labs) - Review information provided - Attend eye doctor annually, dentist every 6 months, work towards or maintain 30 minutes of moderate intensity physical activity at least 5 days per week, and consume a balanced diet - Return in 1 year for physical - Contact us  for any questions between now and then   Asthma  - Continue current asthma management plan. - No changes needed at this time.  Weight Management  - Continue Zepbound as prescribed. - Thyroid  function tests have been ordered. - Maintain healthy lifestyle habits.  Right Lateral Epicondylitis  - Referral placed for shockwave therapy. - Await contact from Sports Medicine for scheduling.  Insomnia  - Continue using Quviviq  as needed for sleep. - May benefit from consistent nightly use if needed.  Psoriatic Arthritis  - Discuss starting sulfasalazine with rheumatologist. - Liver function tests have been ordered.  Red flags to watch for:  - New or worsening joint pain, swelling, or redness - Signs of infection (fever, chills) - Severe or persistent abdominal pain, yellowing of the skin or eyes (possible liver issues) - Difficulty breathing or sudden worsening of asthma symptoms  If any of these symptoms occur, seek medical attention promptly.

## 2024-06-18 NOTE — Progress Notes (Signed)
 Annual Physical Exam Visit  Patient Information:  Patient ID: Christy Farmer, female DOB: Feb 21, 1986 Age: 38 y.o. MRN: [id number]   Subjective:   CC: Annual Physical Exam  HPI:  Christy Farmer is here for their annual physical.  I reviewed the past medical history, family history, social history, surgical history, and allergies today and changes were made as necessary.  Please see the problem list section below for additional details.  Past Medical History: Past Medical History:  Diagnosis Date   Abnormal uterine bleeding (AUB)    Allergy    seasonal allergies.   Asthma    Constipation    Enlarged uterus    Gestational diabetes    Heavy periods    Hepatitis A 09/16/2017   History of gestational diabetes 11/26/2019   Hyperlipidemia    Vaginal Pap smear, abnormal    Past Surgical History: Past Surgical History:  Procedure Laterality Date   ANKLE SURGERY Right 2019   artrhoscopic right ankle surgery - [facility]   COLPOSCOPY     LIPOSUCTION  01/10/2022   laser   Family History: Family History  Problem Relation Age of Onset   Hyperlipidemia Mother    Hypertension Mother    Healthy Father    Hypertension Brother    Hyperlipidemia Brother    Heart attack Maternal Grandmother    Cancer Neg Hx    Diabetes Neg Hx    Heart disease Neg Hx    Allergies: No Known Allergies Health Maintenance: Health Maintenance  Topic Date Due   COVID-19 Vaccine (4 - 2025-26 season) 07/03/2024 (Originally 06/16/2024)   INFLUENZA VACCINE  01/13/2025 (Originally 05/16/2024)   Pneumococcal Vaccine (1 of 2 - PCV) 06/17/2025 (Originally 02/12/2005)   Hepatitis B Vaccines 19-59 Average Risk (1 of 3 - 19+ 3-dose series) 06/17/2025 (Originally 02/12/2005)   HPV VACCINES (1 - 3-dose SCDM series) 06/17/2025 (Originally 02/12/2013)   Cervical Cancer Screening (HPV/Pap Cotest)  06/28/2027   DTaP/Tdap/Td (2 - Td or Tdap) 11/19/2029   Hepatitis C Screening  Completed   HIV  Screening  Completed   Meningococcal B Vaccine  Aged Out    HM Colonoscopy   This patient has no relevant Health Maintenance data.    Medications: Current Outpatient Medications on File Prior to Visit  Medication Sig Dispense Refill   acetaminophen  (TYLENOL ) 500 MG tablet Take 500 mg by mouth every 6 (six) hours as needed.     albuterol  (VENTOLIN  HFA) 108 (90 Base) MCG/ACT inhaler Inhale 2 puffs into the lungs every 6 (six) hours as needed for wheezing or shortness of breath. 8 g 0   Daridorexant  HCl (QUVIVIQ ) 25 MG TABS Take 1-2 tablets (25-50 mg total) by mouth at bedtime as needed. 180 tablet 0   clotrimazole  (LOTRIMIN ) 1 % cream Apply to affected area 2 times daily 15 g 0   diclofenac  (VOLTAREN ) 75 MG EC tablet TAKE 1 TABLET BY MOUTH 2 TIMES DAILY AS NEEDED. 60 tablet 0   fluticasone  (FLONASE ) 50 MCG/ACT nasal spray Place 2 sprays into both nostrils daily. 16 g 0   Minoxidil  (MINOXIDIL  FOR WOMEN) 5 % FOAM Apply 1 Application topically 2 (two) times daily. 60 g 3   Turmeric (QC TUMERIC COMPLEX PO) Take by mouth.     VITAMIN D  PO Take by mouth.     Vitamin D , Ergocalciferol , (DRISDOL ) 1.25 MG (50000 UNIT) CAPS capsule Take 1 capsule (50,000 Units total) by mouth every 7 (seven) days. Take for 8  total doses(weeks) 8 capsule 0   No current facility-administered medications on file prior to visit.    Objective:   Vitals:   06/17/24 0956  BP: 102/66  Pulse: 78  SpO2: 99%   Vitals:   06/17/24 0956  Weight: 146 lb 3.2 oz (66.3 kg)  Height: 5' 4 (1.626 m)   Body mass index is 25.1 kg/m.  General: Well Developed, well nourished, and in no acute distress.  Neuro: Alert and oriented x3, extra-ocular muscles intact, sensation grossly intact. Cranial nerves II through XII are grossly intact, motor, sensory, and coordinative functions are intact. HEENT: Normocephalic, atraumatic, neck supple, no masses, no lymphadenopathy, thyroid  nonenlarged. Oropharynx, nasopharynx, external ear  canals are unremarkable. Skin: Warm and dry, no rashes noted.  Cardiac: Regular rate and rhythm, no murmurs rubs or gallops. No peripheral edema. Pulses symmetric. Respiratory: Clear to auscultation bilaterally. Speaking in full sentences.  Abdominal: Soft, nontender, nondistended, positive bowel sounds, no masses, no organomegaly. Musculoskeletal: Stable, and with full range of motion.   Impression and Recommendations:   The patient was counselled, risk factors were discussed, and anticipatory guidance given.  Problem List Items Addressed This Visit     Asthma   Asthma Asthma is well-controlled with no recent exacerbations reported.      Healthcare maintenance - Primary   Annual examination completed, risk stratification labs ordered, anticipatory guidance provided.  We will follow labs once resulted.  Weight Management She has lost 10 pounds since November 2024 using Zepbound (Tears of Appetite). Current BMI is 25.1, likely within normal range without clothing. Discussed GLP-1 receptor agonists in weight management and potential intermittent use. Emphasized multifactorial nature of weight management and benefits of weight loss on overall health. - Continue Zepbound - Order thyroid  function tests      Relevant Orders   CBC with Differential/Platelet (Completed)   Comprehensive metabolic panel with GFR (Completed)   Lipid panel (Completed)   TSH (Completed)   Hemoglobin A1c (Completed)   Lateral epicondylitis (Right)   Right lateral epicondylitis - Persistent right lateral elbow pain consistent with lateral epicondylitis - Physical therapy over six months provided partial relief - Pain continues to impact daily activities  Right lateral epicondylitis Chronic pain in the right elbow, not fully resolved with physical therapy. Tolerated cortisone 08/2022. Discussed shockwave therapy - Refer for shockwave therapy      Relevant Orders   Ambulatory referral to Sports Medicine    Other insomnia   Insomnia Intermittent use of prescribed sleep medication is effective. Discussed concerns about addiction and reassured that the medication is not addictive. Encouraged continued use as needed for sleep management. - Continue current Quviviq  nightly as-needed, may note benefit from consistent nightly dosing      Psoriatic arthritis (HCC)   Inflammatory arthropathy and medication concerns - Diagnosed with psoriatic arthritis by rheumatology based on symptoms and blood tests - Prescribed sulfasalazine but has not initiated therapy due to concerns about potential hepatotoxicity and the requirement for regular liver function monitoring  Psoriatic arthritis Diagnosis confirmed by rheumatologist based on symptoms and blood tests. She is hesitant to start sulfasalazine due to concerns about liver monitoring. Liver function tests are well-managed. - Discuss starting sulfasalazine with rheumatologist - Order liver function tests      Other Visit Diagnoses       Hair loss       Relevant Orders   TSH (Completed)        Orders & Medications Medications: No orders of the defined types  were placed in this encounter.  Orders Placed This Encounter  Procedures   CBC with Differential/Platelet   Comprehensive metabolic panel with GFR   Lipid panel   TSH   Hemoglobin A1c   Ambulatory referral to Sports Medicine     No follow-ups on file.    Selinda JINNY Ku, MD, [facility]   Primary Care Sports Medicine Primary Care and Sports Medicine at [facility]

## 2024-07-24 NOTE — Progress Notes (Deleted)
   LILLETTE Ileana Collet, PhD, LAT, ATC acting as a scribe for Artist Lloyd, MD.  Christy Farmer is a 38 y.o. female who presents to [facility] at [facility] today for R elbow pain x ***. Pt locates pain to ***  Radiates: Paresthesia: Grip strength: Aggravates: Treatments tried: steroid injection Nov 2023, oral diclofenac ,   Pertinent review of systems: ***  Relevant historical information: ***   Exam:  There were no vitals taken for this visit. General: Well Developed, well nourished, and in no acute distress.   MSK: ***    Lab and Radiology Results No results found for this or any previous visit (from the past 72 hours). No results found.     Assessment and Plan: 38 y.o. female with ***   PDMP not reviewed this encounter. No orders of the defined types were placed in this encounter.  No orders of the defined types were placed in this encounter.    Discussed warning signs or symptoms. Please see discharge instructions. Patient expresses understanding.   ***

## 2024-07-25 ENCOUNTER — Ambulatory Visit: Admitting: Family Medicine

## 2024-09-27 ENCOUNTER — Telehealth: Admitting: Nurse Practitioner

## 2024-09-27 DIAGNOSIS — J029 Acute pharyngitis, unspecified: Secondary | ICD-10-CM | POA: Diagnosis not present

## 2024-09-27 MED ORDER — PROMETHAZINE-DM 6.25-15 MG/5ML PO SYRP: 5.0000 mL | ORAL_SOLUTION | Freq: Four times a day (QID) | ORAL | 0 refills | Status: AC | PRN

## 2024-09-27 MED ORDER — LIDOCAINE VISCOUS HCL 2 % MT SOLN: 15.0000 mL | OROMUCOSAL | 0 refills | Status: AC | PRN

## 2024-09-27 MED ORDER — PREDNISONE 20 MG PO TABS: 20.0000 mg | ORAL_TABLET | Freq: Every day | ORAL | 0 refills | Status: AC

## 2024-09-27 NOTE — Progress Notes (Signed)
 Virtual Visit Consent   Christy Farmer, you are scheduled for a virtual visit with a [facility] provider today. Just as with appointments in the office, your consent must be obtained to participate. Your consent will be active for this visit and any virtual visit you may have with one of our providers in the next 365 days. If you have a MyChart account, a copy of this consent can be sent to you electronically.  As this is a virtual visit, video technology does not allow for your provider to perform a traditional examination. This may limit your provider's ability to fully assess your condition. If your provider identifies any concerns that need to be evaluated in person or the need to arrange testing (such as labs, EKG, etc.), we will make arrangements to do so. Although advances in technology are sophisticated, we cannot ensure that it will always work on either your end or our end. If the connection with a video visit is poor, the visit may have to be switched to a telephone visit. With either a video or telephone visit, we are not always able to ensure that we have a secure connection.  By engaging in this virtual visit, you consent to the provision of healthcare and authorize for your insurance to be billed (if applicable) for the services provided during this visit. Depending on your insurance coverage, you may receive a charge related to this service.  I need to obtain your verbal consent now. Are you willing to proceed with your visit today? Christy Farmer has provided verbal consent on 09/27/2024 for a virtual visit (video or telephone). Christy LELON Servant, NP  Date: 09/27/2024 4:19 PM   Virtual Visit via Video Note   I, Christy Farmer, connected with  Christy Farmer  ([id number], August 13, 1986) on 09/27/2024 at  4:00 PM EST by a video-enabled telemedicine application and verified that I am speaking with the correct person using two identifiers.  Location: Patient: Virtual Visit  Location Patient: Home Provider: Virtual Visit Location Provider: Home Office   I discussed the limitations of evaluation and management by telemedicine and the availability of in person appointments. The patient expressed understanding and agreed to proceed.    History of Present Illness: Christy Farmer is a 38 y.o. who identifies as a female who was assigned female at birth, and is being seen today for viral pharyngitis.  Christy Farmer recently returned from [place] 6 days ago and began experiencing symptoms of sore throat, productive cough and chest congestion. Notes phlegm is greenish. No fever, N/V.    Problems:  Patient Active Problem List   Diagnosis Date Noted   Psoriatic arthritis (HCC) 06/18/2024   Female pattern hair loss 05/25/2023   Other insomnia 05/25/2023   Chronic pain syndrome 04/25/2023   Pharmacologic therapy 04/25/2023   Disorder of skeletal system 04/25/2023   Chronic shoulder pain (Right) 04/25/2023   Osteoarthritis of AC (acromioclavicular) joint (Right) 04/25/2023   Infraspinatus tendinitis (Right) 04/25/2023   Labral tear of shoulder, sequela (Right) 04/25/2023   Dermatosis of scalp 03/02/2023   Healthcare maintenance 03/02/2023   Lateral epicondylitis (Right) 06/30/2022   Mixed hyperlipidemia 02/16/2022   Supraspinatus tendinitis (Right) 02/16/2022   DJD (degenerative joint disease) 02/16/2022   Viral hepatitis A without hepatic coma 10/18/2017   Jaundice 09/16/2017   Asthma 08/13/2015   History of abnormal cervical Pap smear 06/15/2015   Enlarged uterus 06/15/2015   Menorrhagia 06/15/2015   Dysmenorrhea 06/15/2015    Allergies:  Allergies[1] Medications: Current Medications[2]  Observations/Objective: Patient is well-developed, well-nourished in no acute distress.  Resting comfortably at home.  Head is normocephalic, atraumatic.  No labored breathing.  Speech is clear and coherent with logical content.  Patient is alert and oriented at  baseline.    Assessment and Plan: 1. Viral pharyngitis (Primary) - lidocaine  (XYLOCAINE ) 2 % solution; Use as directed 15 mLs in the mouth or throat as needed for mouth pain.  Dispense: 200 mL; Refill: 0 - promethazine -dextromethorphan (PROMETHAZINE -DM) 6.25-15 MG/5ML syrup; Take 5 mLs by mouth 4 (four) times daily as needed.  Dispense: 240 mL; Refill: 0 - predniSONE  (DELTASONE ) 20 MG tablet; Take 1 tablet (20 mg total) by mouth daily with breakfast for 3 days.  Dispense: 3 tablet; Refill: 0  INSTRUCTIONS: use a humidifier for nasal congestion Drink plenty of fluids, rest and wash hands frequently to avoid the spread of infection Alternate tylenol  and Motrin  for relief of fever   Follow Up Instructions: I discussed the assessment and treatment plan with the patient. The patient was provided an opportunity to ask questions and all were answered. The patient agreed with the plan and demonstrated an understanding of the instructions.  A copy of instructions were sent to the patient via MyChart unless otherwise noted below.    The patient was advised to call back or seek an in-person evaluation if the symptoms worsen or if the condition fails to improve as anticipated.    Christy LELON Servant, NP     [1] No Known Allergies [2]  Current Outpatient Medications:    lidocaine  (XYLOCAINE ) 2 % solution, Use as directed 15 mLs in the mouth or throat as needed for mouth pain., Disp: 200 mL, Rfl: 0   predniSONE  (DELTASONE ) 20 MG tablet, Take 1 tablet (20 mg total) by mouth daily with breakfast for 3 days., Disp: 3 tablet, Rfl: 0   promethazine -dextromethorphan (PROMETHAZINE -DM) 6.25-15 MG/5ML syrup, Take 5 mLs by mouth 4 (four) times daily as needed., Disp: 240 mL, Rfl: 0   acetaminophen  (TYLENOL ) 500 MG tablet, Take 500 mg by mouth every 6 (six) hours as needed., Disp: , Rfl:    albuterol  (VENTOLIN  HFA) 108 (90 Base) MCG/ACT inhaler, Inhale 2 puffs into the lungs every 6 (six) hours as needed for  wheezing or shortness of breath., Disp: 8 g, Rfl: 0   clotrimazole  (LOTRIMIN ) 1 % cream, Apply to affected area 2 times daily, Disp: 15 g, Rfl: 0   Daridorexant  HCl (QUVIVIQ ) 25 MG TABS, Take 1-2 tablets (25-50 mg total) by mouth at bedtime as needed., Disp: 180 tablet, Rfl: 0   diclofenac  (VOLTAREN ) 75 MG EC tablet, TAKE 1 TABLET BY MOUTH 2 TIMES DAILY AS NEEDED., Disp: 60 tablet, Rfl: 0   fluticasone  (FLONASE ) 50 MCG/ACT nasal spray, Place 2 sprays into both nostrils daily., Disp: 16 g, Rfl: 0   Minoxidil  (MINOXIDIL  FOR WOMEN) 5 % FOAM, Apply 1 Application topically 2 (two) times daily., Disp: 60 g, Rfl: 3   Turmeric (QC TUMERIC COMPLEX PO), Take by mouth., Disp: , Rfl:    VITAMIN D  PO, Take by mouth., Disp: , Rfl:    Vitamin D , Ergocalciferol , (DRISDOL ) 1.25 MG (50000 UNIT) CAPS capsule, Take 1 capsule (50,000 Units total) by mouth every 7 (seven) days. Take for 8 total doses(weeks), Disp: 8 capsule, Rfl: 0

## 2024-09-27 NOTE — Patient Instructions (Signed)
 Christy Farmer, thank you for joining Haze LELON Servant, NP for today's virtual visit.  While this provider is not your primary care provider (PCP), if your PCP is located in our provider database this encounter information will be shared with them immediately following your visit.   A [facility] MyChart account gives you access to today's visit and all your visits, tests, and labs performed at [facility]  click here if you don't have a [facility] MyChart account or go to [email or web address]  Consent: (Patient) Christy Farmer provided verbal consent for this virtual visit at the beginning of the encounter.  Current Medications:  Current Outpatient Medications:    lidocaine  (XYLOCAINE ) 2 % solution, Use as directed 15 mLs in the mouth or throat as needed for mouth pain., Disp: 200 mL, Rfl: 0   predniSONE  (DELTASONE ) 20 MG tablet, Take 1 tablet (20 mg total) by mouth daily with breakfast for 3 days., Disp: 3 tablet, Rfl: 0   promethazine -dextromethorphan (PROMETHAZINE -DM) 6.25-15 MG/5ML syrup, Take 5 mLs by mouth 4 (four) times daily as needed., Disp: 240 mL, Rfl: 0   acetaminophen  (TYLENOL ) 500 MG tablet, Take 500 mg by mouth every 6 (six) hours as needed., Disp: , Rfl:    albuterol  (VENTOLIN  HFA) 108 (90 Base) MCG/ACT inhaler, Inhale 2 puffs into the lungs every 6 (six) hours as needed for wheezing or shortness of breath., Disp: 8 g, Rfl: 0   clotrimazole  (LOTRIMIN ) 1 % cream, Apply to affected area 2 times daily, Disp: 15 g, Rfl: 0   Daridorexant  HCl (QUVIVIQ ) 25 MG TABS, Take 1-2 tablets (25-50 mg total) by mouth at bedtime as needed., Disp: 180 tablet, Rfl: 0   diclofenac  (VOLTAREN ) 75 MG EC tablet, TAKE 1 TABLET BY MOUTH 2 TIMES DAILY AS NEEDED., Disp: 60 tablet, Rfl: 0   fluticasone  (FLONASE ) 50 MCG/ACT nasal spray, Place 2 sprays into both nostrils daily., Disp: 16 g, Rfl: 0   Minoxidil  (MINOXIDIL  FOR WOMEN) 5 % FOAM, Apply 1 Application topically 2 (two)  times daily., Disp: 60 g, Rfl: 3   Turmeric (QC TUMERIC COMPLEX PO), Take by mouth., Disp: , Rfl:    VITAMIN D  PO, Take by mouth., Disp: , Rfl:    Vitamin D , Ergocalciferol , (DRISDOL ) 1.25 MG (50000 UNIT) CAPS capsule, Take 1 capsule (50,000 Units total) by mouth every 7 (seven) days. Take for 8 total doses(weeks), Disp: 8 capsule, Rfl: 0   Medications ordered in this encounter:  Meds ordered this encounter  Medications   lidocaine  (XYLOCAINE ) 2 % solution    Sig: Use as directed 15 mLs in the mouth or throat as needed for mouth pain.    Dispense:  200 mL    Refill:  0    Supervising Provider:   LAMPTEY, PHILIP O [[id number]]   promethazine -dextromethorphan (PROMETHAZINE -DM) 6.25-15 MG/5ML syrup    Sig: Take 5 mLs by mouth 4 (four) times daily as needed.    Dispense:  240 mL    Refill:  0    Supervising Provider:   BLAISE ALEENE KIDD [id number]   predniSONE  (DELTASONE ) 20 MG tablet    Sig: Take 1 tablet (20 mg total) by mouth daily with breakfast for 3 days.    Dispense:  3 tablet    Refill:  0    Supervising Provider:   BLAISE ALEENE KIDD [[id number]]     *If you need refills on other medications prior to your next appointment, please contact your pharmacy*  Follow-Up: Call back or seek  an in-person evaluation if the symptoms worsen or if the condition fails to improve as anticipated.  [facility] [phone]  Other Instructions INSTRUCTIONS: use a humidifier for nasal congestion Drink plenty of fluids, rest and wash hands frequently to avoid the spread of infection Alternate tylenol  and Motrin  for relief of fever    If you have been instructed to have an in-person evaluation today at a local Urgent Care facility, please use the link below. It will take you to a list of all of our available [facility], including address, phone number and hours of operation. Please do not delay care.  [facility]  If you or a family member do not have a  primary care provider, use the link below to schedule a visit and establish care. When you choose a [facility] primary care physician or advanced practice provider, you gain a long-term partner in health. Find a Primary Care Provider  Learn more about [facility] in-office and virtual care options:  - Get Care Now

## 2024-09-28 ENCOUNTER — Ambulatory Visit

## 2024-10-13 ENCOUNTER — Encounter: Payer: Self-pay | Admitting: Family Medicine

## 2024-10-13 NOTE — Telephone Encounter (Signed)
 Please review and refill if appropriate. LOV 06/2024

## 2024-10-14 ENCOUNTER — Other Ambulatory Visit: Payer: Self-pay | Admitting: Family Medicine

## 2024-10-14 DIAGNOSIS — G4709 Other insomnia: Secondary | ICD-10-CM

## 2024-10-14 MED ORDER — QUVIVIQ 25 MG PO TABS: 1.0000 | ORAL_TABLET | Freq: Every evening | ORAL | 0 refills | Status: DC | PRN

## 2024-10-21 ENCOUNTER — Other Ambulatory Visit: Payer: Self-pay | Admitting: Family Medicine

## 2024-10-21 MED ORDER — LEMBOREXANT 5 MG PO TABS: 5.0000 mg | ORAL_TABLET | Freq: Every evening | ORAL | 0 refills | Status: AC | PRN

## 2024-10-21 NOTE — Telephone Encounter (Signed)
 Please review and advise. Thank you  JM

## 2025-06-19 ENCOUNTER — Encounter: Admitting: Family Medicine
